# Patient Record
Sex: Male | Born: 1960
Health system: Southern US, Community
[De-identification: ages and names within clinical notes are randomized; demographics above are authoritative.]

## PROBLEM LIST (undated history)

## (undated) DIAGNOSIS — R200 Anesthesia of skin: Secondary | ICD-10-CM

## (undated) DIAGNOSIS — K760 Fatty (change of) liver, not elsewhere classified: Secondary | ICD-10-CM

## (undated) DIAGNOSIS — K219 Gastro-esophageal reflux disease without esophagitis: Secondary | ICD-10-CM

## (undated) DIAGNOSIS — G4700 Insomnia, unspecified: Secondary | ICD-10-CM

## (undated) DIAGNOSIS — E119 Type 2 diabetes mellitus without complications: Secondary | ICD-10-CM

## (undated) DIAGNOSIS — Z8719 Personal history of other diseases of the digestive system: Secondary | ICD-10-CM

## (undated) DIAGNOSIS — F419 Anxiety disorder, unspecified: Secondary | ICD-10-CM

## (undated) DIAGNOSIS — M1990 Unspecified osteoarthritis, unspecified site: Secondary | ICD-10-CM

## (undated) DIAGNOSIS — E785 Hyperlipidemia, unspecified: Secondary | ICD-10-CM

## (undated) DIAGNOSIS — R790 Abnormal level of blood mineral: Secondary | ICD-10-CM

## (undated) DIAGNOSIS — R202 Paresthesia of skin: Secondary | ICD-10-CM

## (undated) DIAGNOSIS — IMO0001 Reserved for inherently not codable concepts without codable children: Secondary | ICD-10-CM

## (undated) DIAGNOSIS — Z8669 Personal history of other diseases of the nervous system and sense organs: Secondary | ICD-10-CM

## (undated) DIAGNOSIS — I10 Essential (primary) hypertension: Secondary | ICD-10-CM

## (undated) DIAGNOSIS — K5790 Diverticulosis of intestine, part unspecified, without perforation or abscess without bleeding: Secondary | ICD-10-CM

## (undated) DIAGNOSIS — N529 Male erectile dysfunction, unspecified: Secondary | ICD-10-CM

## (undated) DIAGNOSIS — D649 Anemia, unspecified: Secondary | ICD-10-CM

## (undated) HISTORY — PX: ESOPHAGEAL DILATION: SHX303

## (undated) HISTORY — PX: VASCULAR SURGERY: SHX849

## (undated) HISTORY — PX: HERNIA REPAIR: SHX51

## (undated) HISTORY — PX: CHOLECYSTECTOMY: SHX55

## (undated) HISTORY — PX: RETINAL DETACHMENT SURGERY: SHX105

## (undated) HISTORY — DX: Type 2 diabetes mellitus without complications: E11.9

---

## 2004-05-17 ENCOUNTER — Inpatient Hospital Stay (HOSPITAL_COMMUNITY): Admission: EM | Admit: 2004-05-17 | Discharge: 2004-05-20 | Payer: Self-pay | Admitting: Emergency Medicine

## 2004-11-27 ENCOUNTER — Ambulatory Visit (HOSPITAL_COMMUNITY): Admission: RE | Admit: 2004-11-27 | Discharge: 2004-11-27 | Payer: Self-pay | Admitting: Family Medicine

## 2004-12-26 ENCOUNTER — Inpatient Hospital Stay (HOSPITAL_COMMUNITY): Admission: RE | Admit: 2004-12-26 | Discharge: 2005-01-06 | Payer: Self-pay | Admitting: Orthopedic Surgery

## 2004-12-29 ENCOUNTER — Ambulatory Visit: Payer: Self-pay | Admitting: Internal Medicine

## 2005-02-05 ENCOUNTER — Ambulatory Visit: Payer: Self-pay | Admitting: Internal Medicine

## 2005-02-13 ENCOUNTER — Ambulatory Visit: Payer: Self-pay | Admitting: Internal Medicine

## 2005-02-13 ENCOUNTER — Ambulatory Visit (HOSPITAL_COMMUNITY): Admission: RE | Admit: 2005-02-13 | Discharge: 2005-02-13 | Payer: Self-pay | Admitting: Internal Medicine

## 2008-10-14 ENCOUNTER — Emergency Department (HOSPITAL_COMMUNITY): Admission: EM | Admit: 2008-10-14 | Discharge: 2008-10-15 | Payer: Self-pay | Admitting: Emergency Medicine

## 2011-01-05 LAB — URINE MICROSCOPIC-ADD ON

## 2011-01-05 LAB — URINALYSIS, ROUTINE W REFLEX MICROSCOPIC
Glucose, UA: NEGATIVE mg/dL
Specific Gravity, Urine: 1.025 (ref 1.005–1.030)
Urobilinogen, UA: 4 mg/dL — ABNORMAL HIGH (ref 0.0–1.0)
pH: 6 (ref 5.0–8.0)

## 2011-02-06 NOTE — H&P (Signed)
NAMEGERBER, PENZA                 ACCOUNT NO.:  [id number]   MEDICAL RECORD NO.:  [id number]           PATIENT TYPE:  AMB   LOCATION:                                FACILITY:  [facility]   PHYSICIAN:  R. Roetta Sessions, M.D. DATE OF BIRTH:  1960-10-21   DATE OF ADMISSION:  02/05/2005  DATE OF DISCHARGE:  LH                                [name] & PHYSICAL   CHIEF COMPLAINT:  Mr. Hollingshed is a 50 year old Caucasian male who underwent a  laparoscopic cholecystectomy for symptomatic cholelithiasis back on December 25, 2004.  Unfortunately, his postoperative course was complicated by a biliary  leak confirmed on HIDA scan.  ERCP was done emergently on January 01, 2005,  which revealed a __________ ampullary duodenal diverticulum.  There was a  cystic duct stump leak, and a possible small Inboden in the distal common bile  duct.  An 8.5 x 5 cm stent was placed.  He gradually recovered.  He is  really doing well now, and comes for stent removal.  He has not had any  fever, chills, clay-colored stools, dark-colored urine.  He has not had any  nausea or vomiting.  Appetite is really good, as he reports.   PAST MEDICAL [name]:  1.  Significant for a bout of pancreatitis, likely biliary related, in 1995.  2.  [name] of dysphagia secondary to Schatzki's ring, status post EGD by      Dr. Karilyn Cota in 2005.  3.  [name] of detached retina in 1979 with repair by Dr. Cecilie Kicks and      retention of vision.  4.  Left inguinal herniorrhaphy.  5.  [name] of gastroesophageal reflux disease.  6.  Respiratory arrest during his hospital followed cholecystectomy.   CURRENT MEDICATIONS:  1.  Omeprazole 20 mg orally daily.  2.  Tums.  3.  Dilaudid.   FAMILY [name]:  No [name] of chronic GI or liver disease.   SOCIAL [name]:  The patient has been married for 19 years.  He has a 9-year-  old adopted son.  He is a Archivist here in town, and he also runs a  nursery.  No alcohol, no tobacco.   REVIEW OF SYSTEMS:   As in [name] of present illness.   PHYSICAL EXAMINATION:  GENERAL:  A pleasant 50 year old gentleman who looks  well today.  VITAL SIGNS:  Weight 189, height 6 feet.  Temperature 98, blood pressure  118/64, pulse 76.  SKIN:  Warm and dry.  There is no jaundice.  HEENT:  No scleral icterus.  CHEST:  Lungs are clear to auscultation.  CARDIAC:  Regular rate and rhythm without murmur, gallop, or rub.  ABDOMEN:  Nondistended.  Positive bowel sounds, soft, nontender, without  appreciable mass or organomegaly.  EXTREMITIES:  No edema.   IMPRESSION:  Mr. Stanly [name] is a pleasant 51 year old gentleman who  unfortunately suffered a cystic duct stump leak following laparoscopic  cholecystectomy last month.  He underwent biliary stenting.  He has  recovered uneventfully.  It is time now to remove the stent.  We talked  about the need for a repeat ERCP under general anesthesia with stent  removal.  I told him a cholangiogram would be obtained.  We need to confirm  leak ceiling, which, in all likelihood, has indeed occurred.  The potential  risk of the procedure, including, but not limited to, a 1 in 10 chance of  pancreatitis, bleeding, rash from medications, were fully reviewed.  Once  again, his questions were answered, and he is agreeable.  Will plan to  perform an ERCP at [facility] in the very near future.  He will  receive a dose of Levaquin and obtain baseline labs were obtained prior to  the procedure.  Further recommendations to follow.      RMR/MEDQ  D:  02/05/2005  T:  02/05/2005  Job:  [id number]   cc:   Donna Bernard, M.D.  [address]  [place]  [place] [address]  Fax: [phone]   Dirk Dress. Katrinka Blazing, M.D.  [address]  [place]  [place] [address]  Fax: [phone]

## 2011-02-06 NOTE — Op Note (Signed)
[name], [name]                 ACCOUNT NO.:  [id number]   MEDICAL RECORD NO.:  [id number]          PATIENT TYPE:  INP   LOCATION:  A320                          FACILITY:  [facility]   PHYSICIAN:  R. Roetta Sessions, M.D. DATE OF BIRTH:  Oct 22, 1960   DATE OF PROCEDURE:  01/01/2005  DATE OF DISCHARGE:                                 OPERATIVE REPORT   PROCEDURE:  Endoscopic retrograde cholangiopancreatography with stent  placement.   INDICATIONS FOR PROCEDURE:  The patient is a 50 year old gentleman status  post laparoscopic cholecystectomy December 25, 2004. He has had abdominal pain,  tachycardia, and leukocytosis since that time. HIDA and CT demonstrate leak  and a large biloma. Respectively, ERCP is now being done for stent  placement. This approach has been discussed with the patient at length.  Potential risks, benefits, and alternatives have been reviewed, questions  answered. Please see the discussion in the consultation note. Risks,  benefits, and alternatives have been reviewed in detail, risks including but  not limited to pancreatitis, perforation, reaction to medications. He has  significant ascites as well. All parties are agreeable.   PROCEDURE NOTE:  The patient was placed in a semi-prone position on the OR  table. General anesthesia was introduced by Dr. Jayme Cloud and associates.   INSTRUMENT:  Olympus video chip system.   FINDINGS:  Cursory examination of the distal esophagus, stomach, D1 and D2  revealed no abnormalities aside from a juxta-ampullary duodenal  diverticulum. Scope was pulled back to the short position 55 cm from the  incisors. Scalp film was taken. Using a Microvasive sphincterotome, the  ampulla was found in the base of the diverticulum. Using guidewire  palpation, deep cannulation was obtained after a partial injection of the  pancreatic duct. Deep cannulation of the biliary tree was fairly rapidly  obtained. Cholangiogram was performed. There was a  questionable small 1 to 2  mm filling defect distal CBD. There was forward extravasation of contrast  cystic duct stump. There were two clips overlying the CBD, but I had no  trouble whatsoever injecting contrast or passing a wire across this area. We  were limited in ability to rotate the C arm or the patient. I suspect this  was actually an overlying clip on the cystic duct stump rather than a clip  on the CBD. Please see images. Guidewire was placed deep in the biliary  tree. The sphincterotome was removed over which an 8.5-French, 5-cm, plastic  stent was railed. Was placed in excellent position in the distal CBD. The  guiding catheter and guidewire were subsequently removed. Since the patient  had tense ascites and this was making him somewhat harder to ventilate per  anesthesia (after obtaining informed consent from the family outside the OR  and talking to them about this approach for tomorrow, I elected to go ahead  and attempt paracentesis right lower quadrant to give him some relief and  facilitate respirations/pulmonary mechanics postoperatively. Please see  separate dictation. The patient tolerated the ERCP well without apparent  complications.   IMPRESSION:  1.  Juxta-ampullary duodenum diverticulum, ampulla  actually in the base of      the diverticulum. Cholangiogram performed revealing a _______________      cystic duct stump leak, possible small Waynick in the distal common bile      duct.  2.  Clips overlying the common bile duct likely artifactual and are clips on      the cystic duct stump status post stenting as described above.   RECOMMENDATIONS:  Will check labs tomorrow morning. Advance diet as  tolerated. Will need his stent removed in approximately four weeks.      RMR/MEDQ  D:  01/01/2005  T:  01/01/2005  Job:  [id number]   cc:   Gerda Diss, M.D.

## 2011-02-06 NOTE — Op Note (Signed)
NAME:  [name] [name], [name] [name]                           ACCOUNT NO.:  [id number]   MEDICAL RECORD NO.:  [id number]                   PATIENT TYPE:  INP   LOCATION:  A311                                 FACILITY:  [facility]   PHYSICIAN:  Lionel December, M.D.                 DATE OF BIRTH:  1960/10/11   DATE OF PROCEDURE:  05/20/2004  DATE OF DISCHARGE:                                 OPERATIVE REPORT   PROCEDURE:  Esophagogastroduodenoscopy with esophageal dilation followed by  ampullary exam and bile aspiration with duodenoscope.   INDICATIONS FOR PROCEDURE:  [name] [name] is a 50 year old Caucasian male who  presents with his first episode of pancreatitis, etiology of which is  unclear.  He has improved with supportive therapy.  He also has solid food  dysphagia.  He is undergoing EGD both to look for peptic ulcer disease and  determine the cause of his dysphagia.  He will also have examination of his  papilla with the help of a duodenoscope.  The procedure risks were reviewed  with the patient, and informed consent for the procedure was obtained.   PREOPERATIVE MEDICATIONS:  Cetacaine spray for pharyngeal topical  anesthesia, Demerol 50 mg IV, Versed 10 mg IV in divided dose.   FINDINGS:  The procedure was performed in the endoscopy suite.  The  patient's vital signs and O2 saturations were monitored during the procedure  and remained stable.  The patient was placed in the left lateral recumbent  position, and the Olympus videoscope was passed via the oropharynx without  any difficulty into the esophagus.   Esophagus:  The mucosa of the esophagus was normal throughout.  There was a  ring at the GE junction.  There was a small sliding hiatal hernia.  There  was a small localized varix at the proximal esophagus felt to be an  insignificant finding.   Stomach:  It was empty and distended very well with insufflation.  The folds  of the proximal stomach were normal.  Examination of  the mucosa at body,  antrum, pyloric channel, as well as angularis, fundus, and cardia was  normal.   Duodenum:  Examination of the bulb revealed normal mucosa.  The scope was  passed to the second part of the duodenum where the mucosa and folds were  normal.   The endoscope was withdrawn, and the esophagus was dilated by passing a 56  [place] Maloney dilator.  The scope was passed again, and the ring was noted  to have been effectively disrupted.  Pictures were taken for the record.  The endoscope was withdrawn.  The Olympus videoduodenoscope was passed via  the oropharynx into the stomach, across the pylorus and into the bulb and  descending duodenum.  The ampulla of Vater was located in the wall of a  duodenal diverticulum.  It was normal.  There was a flow of __________ bile  in the  duodenum which was aspirated for cholesterol crystals.  The endoscope  was withdrawn.  The patient tolerated the procedure well.   FINAL DIAGNOSES:  1.  Schatski's ring above a small sliding hiatal hernia.  The ring was      dilated/disrupted by passing a 56 [place] Maloney dilator.  No evidence      of peptic ulcer disease.  2.  Normal-appearing ampulla of Vater with periampullary diverticulum.  Bile      aspirated for cholesterol crystals.   RECOMMENDATIONS:  1.  Low fat diet.  2.  He should continue PPI.  If bile is positive for cholesterol crystals,      he will need cholecystectomy.  Otherwise, he should return in a few      weeks for repeat ultrasound to make sure that the gallbladder wall is      back to normal.      ___________________________________________                                            Lionel December, M.D.   NR/MEDQ  D:  05/20/2004  T:  05/20/2004  Job:  [id number]   cc:   Donna Bernard, M.D.  [address]  [place]  [place] [address]  Fax: [phone]

## 2011-02-06 NOTE — Op Note (Signed)
NAMESHMUEL, GIRGIS                 ACCOUNT NO.:  [id number]   MEDICAL RECORD NO.:  [id number]          PATIENT TYPE:  AMB   LOCATION:  DAY                           FACILITY:  [facility]   PHYSICIAN:  Jerolyn Shin C. Katrinka Blazing, M.D.   DATE OF BIRTH:  Dec 05, 1960   DATE OF PROCEDURE:  12/26/2004  DATE OF DISCHARGE:                                 OPERATIVE REPORT   PREOPERATIVE DIAGNOSIS:  Cholelithiasis, cholecystitis.   POSTOPERATIVE DIAGNOSIS:  Cholelithiasis, cholecystitis.   PROCEDURE:  Laparoscopic cholecystectomy.   SURGEON:  Dr. Katrinka Blazing.   DESCRIPTION:  Under general anesthesia, the patient's abdomen was prepped  and draped in a sterile field. A supraumbilical midline incision was made,  and Veress needle was inserted uneventfully. Abdomen was insufflated with  2.5 liters of CO2. Using a Visiport guide, a 10-mm port was placed  uneventfully. Laparoscope was placed. Gallbladder was visualized. Under  videoscopic guidance, a 10-mm port and two 5-mm ports were placed without  difficulty. The gallbladder was grasped. It was tightly distended, so it was  decompressed using a Weck needle. Once this was done, the gallbladder was  positioned. There were acute and chronic adhesions to the undersurface of  the gallbladder. These were taken down using electrocautery and blunt  dissection. Resection was continued down to the infundibulum. There was  marked acute inflammation in the area of the infundibulum. There were  multiple small vessels which extended to the infundibulum of the  gallbladder. Staying close to the gallbladder, four of these small vessels  were dissected, clipped with multiple clips, and divided. He had a very  short cystic duct. Care was taken to dissect the duct away from the common  bile duct and from the infundibulum. All of these appeared to line in almost  a parallel plane. Dissection was carried out until I was fully assured that  the structure that lead from the end of the  gallbladder and into the common  bile duct was indeed the cystic duct. Once this was done, the gallbladder  was partially separated from the bed of the liver with blunt dissection.  This reconfirmed that this was indeed a very short cystic duct. The duct was  clipped with four clips and divided. After this was done because of the  inflammation. The gallbladder was bluntly dissected from the bed.  Electrocautery was not really necessary. Hemostasis in the bed was achieved.  Gallbladder was placed in the EndoCatch device and retrieved intact.  Irrigation of the bed was carried out, and further hemostasis was carried  out until there was no bleeding. There was no evidence of bile leak.  Irrigation above the liver was carried out. CO2 was allowed to escape from  the abdomen, and the ports were removed. The  incisions were closed using 0 Dexon on the fascia of the supraumbilical  incision and staples on the skin. Dressings were placed. The patient was  awakened from anesthesia, transferred to a bed, and taken to the post-  anesthesia care unit in satisfactory condition.      LCS/MEDQ  D:  12/26/2004  T:  12/26/2004  Job:  [id number]   cc:   Donna Bernard, M.D.  [address]  [place]  [place] [address]  Fax: [phone]

## 2011-02-06 NOTE — Discharge Summary (Signed)
NAMEVA, BROADWELL                           ACCOUNT NO.:  [id number]   MEDICAL RECORD NO.:  [id number]                   PATIENT TYPE:  INP   LOCATION:  A311                                 FACILITY:  [facility]   PHYSICIAN:  Scott A. Gerda Diss, M.D.               DATE OF BIRTH:  June 05, 1961   DATE OF ADMISSION:  05/17/2004  DATE OF DISCHARGE:  05/20/2004                                 DISCHARGE SUMMARY   DISCHARGE DIAGNOSES:  1.  Pancreatitis.  2.  Reflux.   HOSPITAL COURSE:  The patient was admitted in with significant abdominal  pain and discomfort along with elevated amylase.  The pain was severe enough  to almost cause him to pass out.  He had a CT scan ordered.  He also had  consultation done.  The CT scan showed __________ diffuse, retroperitoneal  inflammation of the pancreas consistent with acute pancreatitis.  He also  had an EGD done.  Ultrasound did not show any blockage of ducts.  EGD shows  significant, but small, hiatal hernia, normal stomach, normal duodenum.  Plus, also, a prominent esophageal ring, which was disrupted by procedure by  Dr. Karilyn Cota.  He was discharged to home in good condition and instructed to  follow-up with Dr. __________ in the following week.     _____________________________________  ___________________________________________  Phineas Semen, P.A.                    Scott A. Gerda Diss, M.D.   CL/MEDQ  D:  06/05/2004  T:  06/05/2004  Job:  [id number]

## 2011-02-06 NOTE — Procedures (Signed)
[name] [name], CARATTINI                 ACCOUNT NO.:  [id number]   MEDICAL RECORD NO.:  [id number]          PATIENT TYPE:  INP   LOCATION:  A320                          FACILITY:  [facility]   PHYSICIAN:  Madaline Savage, M.D.DATE OF BIRTH:  07/17/1961   DATE OF PROCEDURE:  01/01/2005  DATE OF DISCHARGE:                                  ECHOCARDIOGRAM   INDICATIONS FOR PROCEDURE:  The patient has apparently had a recent  gallbladder operation and has tachycardia postoperatively. The patient  apparently has no prior cardiac history. Technically, this study is adequate  for interpretation.   RESULTS:  1.  Aortic valve:  The aortic valve is tri-leaflet. It opens and closes      normally. There is no evidence of stenosis. There is no regurgitation      noted. No masses or vegetations are seen.  2.  Mitral valve:  The mitral valve shows no evidence of prolapse. Leaflet      opening and closure is normal. Sub-valvular apparatus is unremarkable.      No evidence of prolapse.  There is also mild mitral regurgitation seen.  3.  Tricuspid valve:  The tricuspid valve is grossly normal. No      regurgitation seen.  4.  Pulmonary valve:  Pulmonic valve incompletely seen, probably within      normal limits.  5.  Aorta:  Normal aortic root dimension 3.9.  6.  Left atrium:  Normal left atrial size of 3.9 to 4.0. No atrial masses      noted.  7.  Right atrium:  Normal.  8.  Right ventricle:  Normal.  9.  Left ventricle:  Normal contractility. Ejection fraction estimate 65% to      75%. All walls are hyper-dynamic and there is tachycardia seen. The left      ventricular chamber dimensions are mildly thickened. Interventricular      septum and posterior walls both measure 1.3.  10:  Pericardium:  The pericardium is not particularly thickened or abnormal  in appearance. There is a small posterior pericardial effusion.   FINAL DIAGNOSES:  1.  Hyper-dynamic left ventricular systolic function. Ejection  fraction      estimate 65% to 75% with mild concentric      left ventricular hypertrophy.  2.  NO valvular pathology noted.  3.  Tiny posterior pericardial effusion.      WHG/MEDQ  D:  01/01/2005  T:  01/01/2005  Job:  [id number]   cc:   Dirk Dress. Katrinka Blazing, M.D.  [address]  [place]  [place] [address]  Fax: [phone]

## 2011-02-06 NOTE — Consult Note (Signed)
NAME:  [name] [name], [name] [name]                           ACCOUNT NO.:  [id number]   MEDICAL RECORD NO.:  [id number]                   PATIENT TYPE:  INP   LOCATION:  A311                                 FACILITY:  [facility]   PHYSICIAN:  Lionel December, M.D.                 DATE OF BIRTH:  10/27/1960   DATE OF CONSULTATION:  DATE OF DISCHARGE:                                   CONSULTATION   REASON FOR CONSULTATION:  1.  Solid food dysphagia.  2.  Dyspepsia.   HISTORY OF PRESENT ILLNESS:  [name] [name] is a 50 year old Caucasian male who  was recently admitted to [facility] on May 12, 2004, with acute  idiopathic pancreatitis.  Upon admission, his amylase was elevated at 511  and lipase was elevated at 1320.  CT scan findings included mild, diffuse  retroperitoneal edema and inflammation showing acute pancreatis.  There was  also fatty infiltration on the left with focal fatty sparing to the left of  the gallbladder region.  He was also found to have diverticulosis as well as  a left inguinal hernia.  Amylase and lipase are back to normal now at 57 and  37 respectively.  As far as his epigastric pain goes, he is feeling much  better.  He notes a sensation of dysphagia with feeling of solid foods  becoming stuck retrosternally.  He notes heartburn and water brash as well  as atypical chest pain postprandially usually with spicy foods.  He has been  using Tums four to eight times a week for the last year or two which does  not completely resolve the symptoms.  He did have an episode of nausea and  vomiting which was Friday evening when this episode of acute pancreatitis  presented.  Otherwise, he denies any early satiety.  He noted the pain was 8  out of 10 to his upper abdomen on Friday as well.  He denies any  odynophagia. Bowel movements have been two to three times per day, soft and  brown without any melena or blood.  Weight has remained stable as well as  appetite.   PAST MEDICAL  HISTORY:  Denies.   SURGICAL HISTORY:  1.  Left inguinal hernia repair.  2.  A detached retina in 1979.   CURRENT MEDICATIONS:  Over-the-counter Tums p.r.n.   ALLERGIES:  No known drug allergies.   FAMILY HISTORY:  No known family history of colorectal carcinoma, liver or  chronic GI problems.  Mother age 22 is alive and healthy.  Father age 77  with a history of diabetes mellitus and coronary artery disease.  He has one  healthy sister.   SOCIAL HISTORY:  [name] [name] has been married for 18 years.  He has an 8-year-  old adopted son.  He denies any alcohol, tobacco or drug use.  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   REVIEW OF SYSTEMS:  CONSTITUTIONAL:  Weight has been stable.  Appetite is  okay.  He denies any fatigue.  CARDIOVASCULAR:  Denies any palpitations or  chest pain other than described in HPI.  PULMONOLOGY:  He does report  occasional cough which is worse at bedtime when he lies supine.  He denies  any hemoptysis or dyspnea.  GI:  See HPI.  GU:  Denies any dysuria,  hematuria or increased urinary frequency.   PHYSICAL EXAMINATION:  VITAL SIGNS:  Weight 202 pounds.  Height 71 inches.  Temperature 98.4.  Pulse 54.  Respirations 20.  Blood pressure 113/81.  GENERAL:  [name] [name] is alert, oriented, pleasant, cooperative Caucasian male  who is in no acute distress.  HEENT:  Sclerae are clear, nonicteric.  Conjunctivae are pink.  Oropharynx  is pink and moist without any lesions.  NECK:  Supple without any mass or thyromegaly.  CHEST:  Heart regular rate and rhythm, normal S1 and S2 without any murmurs,  clicks, rubs or gallops.  LUNGS:  Clear to auscultation bilaterally.  ABDOMEN:  Positive bowel sounds x4.  No bruits auscultated, soft, nontender,  nondistended without palpable mass or hepatosplenomegaly.  No rebound,  tenderness or guarding.  Negative Murphy's sign.  EXTREMITIES:  Good pedal pulses bilaterally, no edema.  SKIN:   Pink, warm and dry without rash or jaundice.   LABORATORY DATA:  WBC is 12.6, hemoglobin 15.5, hematocrit 45, platelets  157, calcium 8.5, sodium 138, potassium 4, chloride 104, CO2 27, BUN 9,  creatinine 0.9.  Glucose 133.  Total bilirubin 2, direct 0.3, and indirect  1.7.  Alkaline phosphatase 63, SGOT 17, SGPT 31.  Total protein 6.  Albumin  3.1.  Total cholesterol 67.  Triglycerides mildly elevated at 188.  HDL low  at 28.  LDL 101.  Amylase 57, lipase 37, CT as described in HPI.   ASSESSMENT:  Ms. Okane is a 50 year old Caucasian male with acute idiopathic  pancreatitis which is resolving well.  He does complain of a significant  history of solid food dysphagia as well as chronic gastroesophageal reflux  disease symptoms and dyspepsia over the last two years which have more  recently worsened.  There were no gallbladder abnormalities on CT scan,  although he does have abdominal ultrasound pending which will rule out  choledocholithiasis as well as cholelithiasis.  Further evaluation is  necessary to evaluate his upper GI tract for complications of chronic  gastroesophageal reflux disease including web ring stricture or less likely  neoplasm.   RECOMMENDATIONS:  1.  Will follow up on ultrasound report.  2.  Will schedule EGD in the morning with Dr. Lionel December.  This has been      discussed with Dr. Karilyn Cota as well as the patient, and both are in      agreement with this plan.  Consent will be obtained.  3.  I have discussed the risks and benefits to include, but not limited to      bleeding, infection, perforation and drug reaction.  He agrees with the      plan.  4.  We will resume a full liquid diet for now.  N.p.o. post midnight for EGD      in the morning.     ________________________________________  ___________________________________________  Nicholas Lose, N.P.                  Lionel December, M.D.  KC/MEDQ  D:  05/19/2004  T:  05/19/2004  Job:  [id number]   cc:    Lorin Picket A. Gerda Diss, M.D.  [address]  [place]  [place] [address]  Fax: [phone]

## 2011-02-06 NOTE — H&P (Signed)
[name] [name], GHOSH                 ACCOUNT NO.:  [id number]   MEDICAL RECORD NO.:  [id number]          PATIENT TYPE:  AMB   LOCATION:  DAY                           FACILITY:  [facility]   PHYSICIAN:  Jerolyn Shin C. Katrinka Blazing, M.D.   DATE OF BIRTH:  April 12, 1961   DATE OF ADMISSION:  DATE OF DISCHARGE:  LH                                HISTORY & PHYSICAL   This is a 50 year old male who has had two episodes of severe abdominal pain  in his epigastrium radiating to his back.  He has a prior history of  pancreatitis for which he was hospitalized for four days.  The patient has  onset of symptoms with each meal.  Gallbladder ultrasound shows gallbladder  wall thickening with sludge and small stones for pericholecystic fluid.  There is no family history of gallstone disease.  The patient is scheduled  for cholecystectomy.   PAST MEDICAL HISTORY:  Unremarkable except for gastroesophageal reflux  disease.   PAST SURGICAL HISTORY:  Left inguinal hernia repair.   ALLERGIES:  None.   MEDICATIONS:  Omeprazole 20 mg daily.   FAMILY HISTORY:  Positive for diabetes mellitus.   SOCIAL HISTORY:  He is married.  He is self-employed as a Archivist.  He  does not smoke, drink or use drugs.   PHYSICAL EXAMINATION:  VITAL SIGNS:  Blood pressure 130/77, pulse 72,  respirations 18, weight 199 pounds.  HEENT:  Unremarkable.  NECK:  Supple without JVD or bruits.  CHEST:  Clear to auscultation.  HEART:  Regular rate and rhythm without murmur, gallop or rub.  ABDOMEN:  Soft, actually no tenderness.  Normal bowel sounds.  EXTREMITIES:  No cyanosis, clubbing or edema.  NEUROLOGIC:  No focal motor, sensory or cerebellar deficits.   IMPRESSION:  1.  Cholelithiasis with cholecystitis.  2.  Gastroesophageal reflux disease.   PLAN:  Laparoscopic cholecystectomy.      LCS/MEDQ  D:  12/25/2004  T:  12/26/2004  Job:  [id number]

## 2011-02-06 NOTE — H&P (Signed)
[name] [name], [name] [name]                           ACCOUNT NO.:  [id number]   MEDICAL RECORD NO.:  [id number]                   PATIENT TYPE:  EMS   LOCATION:  ED                                   FACILITY:  [facility]   PHYSICIAN:  Scott A. Gerda Diss, M.D.               DATE OF BIRTH:  03-06-1961   DATE OF ADMISSION:  05/17/2004  DATE OF DISCHARGE:                                HISTORY & PHYSICAL   CHIEF COMPLAINT:  Abdominal pain.   HISTORY OF PRESENT ILLNESS:  A 50 year old white male who relates that he  had an onset of abdominal pain yesterday while eating a pack of __________  , severe epigastric, went away after about 20-30 minutes, but then  reoccurred, so severe that if he tried to get up he felt like he was going  to pass out.  He was very nauseated with it, threw up once.  Denied any  blood in it.  States he went on to the emergency 911 at first but then the  pain subsided so his friend went ahead and took him up to see [facility], saw Dr. Lubertha South.  At that time, the pain had subsided,  thought it was possibly gastritis but was told to call back if worse.  The  patient had severe pain throughout the whole night with nausea.  No  vomiting.  Denied any bloody stools, had a normal bowel movement this  morning, normal urination.  The patient states the pain was very severe this  morning but has eased up some currently.   PAST MEDICAL HISTORY:  1. He has been hospitalized twice in the past, once with a detached retina     in 1979.  2. And, once with an inguinal hernia in the 1970s.   SOCIAL HISTORY:  The patient does not drink or smoke.   MEDICATIONS:  None.   ALLERGIES:  None.   LABS:  WBC 12.7, hemoglobin 16.1.  Amylase 511.  Liver functions were normal  but bilirubin slightly elevated at 1.8.  Urinalysis negative.   PHYSICAL EXAMINATION:  HEENT:  Benign.  NECK:  Supple.  CHEST:  CTA.  No crackles.  HEART:  Regular.  ABDOMEN:  Soft, nontender with mild  epigastric tenderness.  EXTREMITIES:  No edema.  RECTAL:  Negative.  GU:  Normal.   ASSESSMENT/PLAN:  Pancreatitis.  Feel the best thing for this patient would  be admitted in.  Repeat labs in the morning.  Place on a liquid diet.  Also  do a CT of the abdomen and pelvis to look to see if there is any peripheral  reason why he is having pancreatitis.  Also check a lipid profile in the  morning to look at his triglycerides.  Ultrasound technology not available  currently.  I do not feel the patient has a surgical abdomen at this point  in time.  ___________________________________________                                         Jonna Coup Gerda Diss, M.D.   Linus Orn  D:  05/17/2004  T:  05/17/2004  Job:  [id number]

## 2011-02-06 NOTE — Discharge Summary (Signed)
NAMEKOLBE, DELMONACO                 ACCOUNT NO.:  [id number]   MEDICAL RECORD NO.:  [id number]          PATIENT TYPE:  INP   LOCATION:  A320                          FACILITY:  [facility]   PHYSICIAN:  Dirk Dress. Katrinka Blazing, M.D.   DATE OF BIRTH:  20-Aug-1961   DATE OF ADMISSION:  12/26/2004  DATE OF DISCHARGE:  04/18/2006LH                                 DISCHARGE SUMMARY   DISCHARGE DIAGNOSIS:  1.  Cholelithiasis, cholecystitis.  2.  Acute respiratory arrest due to a Dilaudid.  3.  Cystic duct leak postoperative.  4.  Viral peritonitis.  5.  Urinary tract infection.  6.  Gastroesophageal reflux disease.   SPECIAL PROCEDURES:  1.  Laparoscopic cholecystectomy April 7.  2.  ERCP with common bile duct stent placement April 13.  3.  Abdominal paracentesis April 13.  4.  Ultrasound-guided abdominal paracentesis April 14.  5.  Laparoscopy with peritoneal lavage April 14.   DISPOSITION:  The patient is discharged home significantly improved.   DISCHARGE MEDICATIONS:  1.  Xanax 0.5 mg three times daily.  2.  Reglan 10 mg before meals and at bedtime.  3.  Lasix 40 mg daily for five days.  4.  Potassium chloride 20 mEq twice daily for seven days.  5.  Keflex 500 mg four times daily for seven days.  6.  Levaquin 500 mg daily for seven days.  7.  Demerol 50 mg every four hours as needed for pain.   Patient is scheduled to be seen in the office four days post discharge.   SUMMARY:  A 50 year old male with two episodes of severe abdominal pain in  his epigastrium radiating through to his back.  He has a prior history of  pancreatitis requiring hospitalization.  He had onset of symptoms with each  meal.  Gallbladder ultrasound showed gallbladder wall thickening with sludge  and small stones, but no pericholecystic fluid.  He was admitted for  laparoscopic cholecystectomy.  This was done on April 7.  The patient did  well in the early postoperative period.  However, while making afternoon  rounds  the patient became unresponsive after receiving 2 mg of Dilaudid IV.  When I evaluated him he was slumped over in bed with ineffective  respirations and no response to pain.  He had a palpable right radial pulse.  His initial O2 saturation was in the 70% range.  He was given O2, turned  supine.  His head was elevated and his respirations improved.  He did not  need any compressions or ventilatory support.  He was promptly given Narcan  1 mg slow IV push and he became fully alert, started complaining of pain.  Vital signs immediately after the Narcan revealed blood pressure 137/74,  pulse 102, respirations 24, O2 saturation 98%.  He remained stable over the  next hour and he was subsequently given some Toradol IV for pain.  It is  felt that he had an adverse reaction with respiratory depression due to  Dilaudid.  The patient continued, however, to complain of right shoulder  pain and he had  persistent hypoxemia on room air.  CT angiography was done  to rule out pulmonary embolus and this was negative for pulmonary embolus.  By the 10th patient was stable.  He had no nausea.  He did complain of left  lower quadrant pain.  O2 saturation was 92% on room air.  It was noted on  the 11th that he had leukocytosis with white count 17,000.  By the 12th he  was complaining of anorexia, increasing abdominal distension, and more  peripheral edema.  HIDA was done and this showed an area of increased uptake  in the bed of the liver which suggested a bile leak.  White count was still  elevated.  CT scan of the abdomen and pelvis was done.  This revealed a  large volume of fluid in the abdomen compatible with a biloma.  He was  discussed with Dr. Jena Gauss and Dr. Jena Gauss did an ERCP with stenting.  ERCP  confirmed cystic duct leak.  Stent was placed.  He attempted to do  paracentesis, but could only remove 75 mL of fluid.  Ultrasound-guided  paracentesis was done the next morning by radiology but they could only  get  30 mL.  It was therefore felt that we needed to drain his abdomen  laparoscopically.  This was discussed with the patient and his wife.  Laparoscopic lavage and drainage of the large volume of peritoneal fluid was  carried out.  Post procedure his white count was 73,000 but it gradually  improved.  After the paracentesis a JP drain was placed and he had bilious  drainage through his JP up until the time the that he was discharged.  By  the 17th the patient was stable.  He had mild pain in the right upper  quadrant.  White count was found to 21.4.  Liver function studies were  normal.  He continued to do well and on the evening of the 18th he was  significantly improved.  He was basically afebrile and arrangements were  made for him to be discharged home with plans for close follow-up in the  office.       LCS/MEDQ  D:  02/15/2005  T:  02/15/2005  Job:  [id number]

## 2011-02-06 NOTE — Op Note (Signed)
NAMELAMAR, METER                 ACCOUNT NO.:  [id number]   MEDICAL RECORD NO.:  [id number]          PATIENT TYPE:  INP   LOCATION:  A320                          FACILITY:  [facility]   PHYSICIAN:  R. Roetta Sessions, M.D. DATE OF BIRTH:  08-30-1961   DATE OF PROCEDURE:  01/01/2005  DATE OF DISCHARGE:                                 OPERATIVE REPORT   PROCEDURE:  Paracentesis.   INDICATIONS FOR PROCEDURE:  The patient is a 50 year old gentleman status  post ERCP with stent placement for _____________ cystic duct stump leak. He  has significant ascites. His abdomen is distended. He is having some  difficulty being ventilated. He just underwent an ERCP. After discussion  with the family, decided to go ahead and attempt paracentesis, large volume,  to give him some relief to facilitate extubation, etc. This was tentatively  planned to occur tomorrow.   DESCRIPTION OF PROCEDURE:  Skin overlying the right lower quadrant prepped  in sterile fashion with Betadine. Three cc  of Xylocaine was used for local  infiltrative anesthesia. Using a [employer] needle, abdominal wall  was traversed. There was amber but serosanguineous appearing fluid that  dripped slowly out of the paracentesis needle, ultimately recovered about 75  cc. I repositioned the needle in the same location a couple of times but was  unable to get a good flow in spite of actually raising the head of the  patient and external abdominal pressure. I elected to make no further  attempts at performing a therapeutic tap. Decided to make arrangements for  Dr. Alver Fisher to take off what he could via ultrasound tomorrow. I suspect some  fluid is loculated.   IMPRESSION:  Status post paracentesis, right lower quadrant, recovery of 75  cc of fluid. See orders.      RMR/MEDQ  D:  01/01/2005  T:  01/01/2005  Job:  [id number]   cc:   Gerda Diss, M.D

## 2011-02-06 NOTE — Op Note (Signed)
NAMECHRISTAPHER, GILLIAN                 ACCOUNT NO.:  [id number]   MEDICAL RECORD NO.:  [id number]          PATIENT TYPE:  AMB   LOCATION:  DAY                           FACILITY:  [facility]   PHYSICIAN:  R. Roetta Sessions, M.D. DATE OF BIRTH:  11/05/1960   DATE OF PROCEDURE:  02/13/2005  DATE OF DISCHARGE:                                 OPERATIVE REPORT   PROCEDURE:  Endoscopic retrograde cholangiopancreatography with stent  removal.   INDICATIONS FOR PROCEDURE:  The patient is a 50 year old gentleman status  post laparoscopic cholecystectomy complicated by cystic duct leak last month  who has done well after having ERCP with stent placement by me. He is here  for ERCP with stent removal, assuming the leak has healed. Potential risks,  benefits, and alternatives have been reviewed. We talked about the 1 in 10  chance of pancreatitis, perforation, reaction to medication, potential for  stent replacement and later removal. All questions were answered, and all  parties were agreeable.   PROCEDURE NOTE:  O2 saturation, blood pressure, pulse, and respirations were  monitored throughout the entire procedure. The patient received Levaquin 250  mg IV prior to the procedure. General anesthesia was induced by Dr. Jayme Cloud  and associates. The patient was placed in the semi-prone position on the OR  table. The patient was noted to have a mottle rash following endotracheal  intubation of general anesthesia for which he was given Decadron and  Benadryl with resolution of the rash.   INSTRUMENT:  Olympus video chip system.   FINDINGS:  Cursory examination of the distal esophagus, stomach, D1 and D2  demonstrated previously placed biliary stent. Duodenum diverticulum was also  seen once again. Scope was pulled back to the short position. Scout film was  taken; however, it was not saved. Using a snare through the scope, the stent  was pulled out with the scope. The scope was reintroduced in the  duodenum  using the Microvasive sphincterotome. Cholangiogram was obtained. I obtained  deep cannulation almost immediately with guide wire palpation. Cholangiogram  was again obtained. There was no evidence of extravasation of contrast. The  biliary tree appeared normal status post cholecystectomy. The biliary tree  appeared to be draining fairly well through the ampullary orifice (prior  sphincterotomy not done). Because I saw steady flow of bile and there was  less contrast density on the last films, I felt no further intervention was  warranted. The procedure was concluded. The patient appeared to have  tolerated the procedure well and will be taken to PACU for reaction.   IMPRESSION:  Duodenum diverticulum, previously placed biliary stent removed.  It is notable this stent was almost totally occluded. Residual biliary tree  appeared normal status post cholecystectomy and previously documented cystic  duct leak has sealed.   RECOMMENDATIONS:  Will allow him to recover in the PACU. Hopefully, we will  be allowed to go home later today with no further intervention.      RMR/MEDQ  D:  02/13/2005  T:  02/13/2005  Job:  [id number]   cc:   Dirk Dress.  Katrinka Blazing, M.D.  [address]  [place]  [place] [address]  Fax: [phone]   Donna Bernard, M.D.  [address]  [place]  [place] [address]  Fax: [phone]

## 2011-02-06 NOTE — Consult Note (Signed)
[name] [name], [place]                 ACCOUNT NO.:  [id number]   MEDICAL RECORD NO.:  [id number]          PATIENT TYPE:  INP   LOCATION:  A320                          FACILITY:  [facility]   PHYSICIAN:  R. Roetta Sessions, M.D. DATE OF BIRTH:  [birth year]   DATE OF CONSULTATION:  01/01/2005  DATE OF DISCHARGE:                                   CONSULTATION   REASON FOR CONSULTATION:  Postoperative bile leak.   HISTORY OF PRESENT ILLNESS:  [name] [name] is a pleasant 50 year old  Caucasian male who presented with symptomatic cholelithiasis back on December 24, 2004. On December 25, 2004, he underwent a laparoscopic cholecystectomy. He  has remained hospitalization since the operation. He has had some dyspnea,  leukocytosis, and upper abdominal pain. He had a respiratory arrest  postoperatively after receiving some IV Dilaudid.   He underwent a HIDA scan last evening which showed a localized tracer  accumulation in the area in the gallbladder fossa. CT scan today  demonstrated a large fluid collection consistent with biloma. I was  subsequently consulted.   CT angiogram postoperatively was negative for pulmonary embolus. His white  count yesterday was 17,200; was 17,200 on April 11, H and H 13.8 and 41.0.  Bilirubin slightly up at 1.7 and direct of 0.8, AST 12, ALT 13.   PAST MEDICAL HISTORY:  1.  Significant for a bout of pancreatitis which he was hospitalized for      four days last year.  2.  History of dysphagia secondary to Schatzki's ring. Underwent EGD by Dr.      Karilyn Cota back on May 20, 2004. Dr Karilyn Cota took a look at the ampulla      with a side-viewing scope; it appeared normal. He did sent fluid off      looking for microlithiasis; those results are unknown to me at this      time.  3.  Detached retina in 1979. He had repair with retention of vision. He is      followed by Dr. Cecilie Kicks.  4.  History of left inguinal herniorrhaphy previously.  5.  History of gastroesophageal reflux  disease for which he takes Tums on a      p.r.n. basis.   CURRENT MEDICATIONS:  Tums p.r.n.   ALLERGIES:  No known drug allergies.   FAMILY HISTORY:  No history of chronic GI or liver illness. Father has  diabetes.   SOCIAL HISTORY:  The patient has been married for 19 years. He has a 9-year-  old adopted son. No alcohol. No tobacco. 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   REVIEW OF SYSTEMS:  As in History of Present Illness. He does not have any  more dysphagia. Reflux fairly well controlled. No melena. No rectal  bleeding. Dyspnea and upper abdominal discomfort postoperatively as outlined  above.   PHYSICAL EXAMINATION:  GENERAL:  Reveals a 50 year old gentleman who appears  not to feel well. He is alert, conversant, accompanied by his wife and  mother.  VITAL SIGNS:  Temperature 98.1, pulse 120,  BP 133/80.  SKIN:  Sallow in appearance. No obvious jaundice or continued stigmata of  chronic liver disease.  HEENT:  No sclerae icterus. Oral cavity with no lesions. JVD is not  prominent.  CHEST:  Lungs are clear with decreased inspirations bilaterally.  CARDIAC:  Tachycardia. Regular rate and rhythm without murmur, gallop or  rub.  ABDOMEN:  Distended, positive fluid wave, shifting dullness. He has diffuse  abdominal tenderness to palpation. No obvious mass, organomegaly, or  rebound.  EXTREMITIES:  Trace lower extremity edema.   IMPRESSION:  [name] [name] is a pleasant 50 year old gentleman who underwent a  laparoscopic cholecystectomy December 25, 2004 for symptomatic cholelithiasis.  Unfortunately, postoperative course has been complicated by a bile leak. He  has significant symptoms related to this problem.   RECOMMENDATIONS:  He needs to have a stent placed in his bile duct to  facilitate closure of the leak, and given the large amount of fluid in his  peritoneal cavity, will likely need a subsequent paracentesis to provide him  with additional relief.    To this end, I have recommended [name] [name] in the presence of his wife and  mother that we proceed with an ERCP ASAP for placement of a biliary stent.  Hopefully, it can be done without a sphincterotomy. We talked about the  potential risks, benefits, alternatives, and limitations; specifically  talked about complications including but limited to a 1 in 10 chance of  pancreatitis, perforation, reaction to medications, and the potential for a  failed procedure necessitating transfer to a tertiary referral center. Also  told them that if a stent is placed it was temporary, and he would have to  have subsequent ERCP in approximately four weeks to document leak closure  and stent removal. All questions were answered. All parties were agreeable.  Not mentioned above, he was started on IV Levaquin yesterday. Will continue  this regimen for the time being. Further recommendations to follow.   I would like to thank Dr. Elpidio Anis for allowing me to see this nice  gentleman today.      RMR/MEDQ  D:  01/01/2005  T:  01/01/2005  Job:  [id number]

## 2012-12-01 ENCOUNTER — Encounter (HOSPITAL_COMMUNITY): Payer: Self-pay | Admitting: Dietician

## 2012-12-01 NOTE — Progress Notes (Signed)
[facility] Diabetes Class Completion  Date:December 01, 2012  Time: 5:30 PM  Pt attended [facility]'s Diabetes [facility] Education Class on December 01, 2012.   Patient was educated on the following topics: survival skills (signs and symptoms of hyperglycemia and hypoglycemia, treatment for hypoglycemia, ideal levels for fasting and postprandial blood sugars, goal Hgb A1c level, foot care basics), recommendations for physical activity, carbohydrate metabolism in relation to diabetes, and meal planning (sources of carbohydrate, carbohydrate counting, meal planning strategies, food label reading, and portion control).   Frederik Standley A. Kayan, RD, LDN   

## 2013-02-22 ENCOUNTER — Other Ambulatory Visit: Payer: Self-pay | Admitting: Family Medicine

## 2013-07-27 ENCOUNTER — Encounter: Payer: Self-pay | Admitting: Nurse Practitioner

## 2013-07-27 ENCOUNTER — Ambulatory Visit (INDEPENDENT_AMBULATORY_CARE_PROVIDER_SITE_OTHER): Payer: BC Managed Care – PPO | Admitting: Nurse Practitioner

## 2013-07-27 VITALS — BP 112/88 | Temp 98.6°F | Ht 73.0 in | Wt 214.5 lb

## 2013-07-27 DIAGNOSIS — J069 Acute upper respiratory infection, unspecified: Secondary | ICD-10-CM

## 2013-07-27 MED ORDER — HYDROCODONE-HOMATROPINE 5-1.5 MG/5ML PO SYRP: 5.0000 mL | ORAL_SOLUTION | ORAL | Status: DC | PRN

## 2013-07-27 MED ORDER — AZITHROMYCIN 250 MG PO TABS: ORAL_TABLET | ORAL | Status: DC

## 2013-07-29 ENCOUNTER — Encounter: Payer: Self-pay | Admitting: Nurse Practitioner

## 2013-07-29 NOTE — Progress Notes (Signed)
Subjective:  Presents complaints of low-grade fever and cold symptoms for the past 2 days. Slight headache. Cough worse at night, nonproductive. No sore throat or ear pain. No wheezing. No vomiting diarrhea or abdominal pain. Using prescription nasal spray.  Objective:   BP 112/88  Temp(Src) 98.6 F (37 C) (Oral)  Ht 6\' 1"  (1.854 m)  Wt 214 lb 8 oz (97.297 kg)  BMI 28.31 kg/m2 NAD. Alert, oriented. TMs clear effusion, no erythema. Pharynx injected with PND noted. Neck supple with mild soft nontender adenopathy. Lungs clear. Heart regular rate rhythm.  Assessment:Acute upper respiratory infections of unspecified site  Plan: Meds ordered this encounter  Medications  . naproxen sodium (ANAPROX) 220 MG tablet    Sig: Take 220 mg by mouth 2 (two) times daily with a meal.  . Misc Natural Products (OSTEO BI-FLEX JOINT SHIELD PO)    Sig: Take by mouth daily.  Marland Kitchen azithromycin (ZITHROMAX Z-PAK) 250 MG tablet    Sig: Take 2 tablets (500 mg) on  Day 1,  followed by 1 tablet (250 mg) once daily on Days 2 through 5.    Dispense:  6 each    Refill:  0    Order Specific Question:  Supervising Provider    Answer:  Merlyn Albert [[id number]]  . HYDROcodone-homatropine (HYCODAN) 5-1.5 MG/5ML syrup    Sig: Take 5 mLs by mouth every 4 (four) hours as needed.    Dispense:  120 mL    Refill:  0    Order Specific Question:  Supervising Provider    Answer:  Merlyn Albert [[id number]]   OTC meds as directed. Call back in 4-5 days if no improvement, sooner if worse.

## 2013-10-17 ENCOUNTER — Other Ambulatory Visit: Payer: Self-pay | Admitting: Family Medicine

## 2013-11-03 ENCOUNTER — Encounter: Payer: Self-pay | Admitting: Family Medicine

## 2013-11-03 ENCOUNTER — Ambulatory Visit (INDEPENDENT_AMBULATORY_CARE_PROVIDER_SITE_OTHER): Payer: BC Managed Care – PPO | Admitting: Family Medicine

## 2013-11-03 VITALS — BP 126/88 | Temp 98.3°F | Ht 73.0 in | Wt 211.0 lb

## 2013-11-03 DIAGNOSIS — J329 Chronic sinusitis, unspecified: Secondary | ICD-10-CM

## 2013-11-03 DIAGNOSIS — J310 Chronic rhinitis: Secondary | ICD-10-CM

## 2013-11-03 MED ORDER — LEVOFLOXACIN 500 MG PO TABS: 500.0000 mg | ORAL_TABLET | Freq: Every day | ORAL | Status: DC

## 2013-11-03 NOTE — Progress Notes (Signed)
   Subjective:    [name] W Ziller, male    DOB: 16-Jan-1961, 53 y.o.   MRN: [id number]  Cough This is a new problem. The current episode started 1 to 4 weeks ago. Associated symptoms include shortness of breath. Associated symptoms comments: congestion.   Started over a week  Ended up with cong  [place] [facility]  Family has had sickness  Decreased energy  Off and on productive, no fever No nausea no vom  Neg headache clogged up nose at times       Review of Systems  Respiratory: Positive for cough and shortness of breath.    no vomiting no diarrhea no abdominal pain ROS otherwise negative     Objective:   Physical Exam  Alert moderate malaise. Intermittent cough during exam. HEENT moderate nasal congestion frontal tenderness trace normal neck supple. Lungs clear no wheezes no crackles no tachypnea heart regular in rhythm.      Assessment & Plan:  Impression 1 sinusitis/bronchitis plan Levaquin daily 10 days. Symptomatic care discussed. WSL

## 2013-11-21 ENCOUNTER — Other Ambulatory Visit: Payer: Self-pay | Admitting: Family Medicine

## 2014-03-14 ENCOUNTER — Ambulatory Visit (INDEPENDENT_AMBULATORY_CARE_PROVIDER_SITE_OTHER): Payer: BC Managed Care – PPO | Admitting: Family Medicine

## 2014-03-14 ENCOUNTER — Encounter: Payer: Self-pay | Admitting: Family Medicine

## 2014-03-14 ENCOUNTER — Ambulatory Visit: Payer: BC Managed Care – PPO | Admitting: Family Medicine

## 2014-03-14 VITALS — BP 132/80 | Temp 98.2°F | Ht 73.0 in | Wt 214.4 lb

## 2014-03-14 DIAGNOSIS — R21 Rash and other nonspecific skin eruption: Secondary | ICD-10-CM

## 2014-03-14 MED ORDER — DOXYCYCLINE HYCLATE 100 MG PO TABS: 100.0000 mg | ORAL_TABLET | Freq: Two times a day (BID) | ORAL | Status: DC

## 2014-03-14 NOTE — Progress Notes (Signed)
   Subjective:    [name] W Betten, male    DOB: 1961-09-14, 53 y.o.   MRN: [id number]  Rash This is a new problem. The current episode started in the past 7 days. The problem is unchanged. The affected locations include the left arm. The rash is characterized by redness and swelling. He was exposed to an insect bite/sting. Past treatments include nothing. The treatment provided no relief.   Patient states that he has pain in his great toe on his right foot. This has been present for about 1 month now. Recalls no injury but did wear a hard shoe.  No headache no rash elsewhere no fever or good appetite  Started few d ago,   Review of Systems  Skin: Positive for rash.   ROS as noted     Objective:   Physical Exam  Alert no acute distress left shoulder and large erythematous macule 6 cm across no central clearing. Lungs clear. Heart rare rhythm. Distal great toe some dorsal inflammatory changes along with plus minus diminished sensation distally at great toe only      Assessment & Plan:  Impression 1 rash at site of bite patient concerned about potential tickborne illness. Doubtful though theoretically possible. #2 neuropraxia with toe discomfort plan Doxy twice a day 10 symptomatic care discussed. Reassurance WSL

## 2014-04-16 ENCOUNTER — Other Ambulatory Visit: Payer: Self-pay | Admitting: Family Medicine

## 2014-04-17 ENCOUNTER — Telehealth: Payer: Self-pay | Admitting: Family Medicine

## 2014-04-17 DIAGNOSIS — Z125 Encounter for screening for malignant neoplasm of prostate: Secondary | ICD-10-CM

## 2014-04-17 DIAGNOSIS — Z79899 Other long term (current) drug therapy: Secondary | ICD-10-CM

## 2014-04-17 DIAGNOSIS — Z13220 Encounter for screening for lipoid disorders: Secondary | ICD-10-CM

## 2014-04-17 NOTE — Telephone Encounter (Signed)
Patient notified and verbalized understanding. 

## 2014-04-17 NOTE — Telephone Encounter (Signed)
Patient wants to know if he is due for blood work?

## 2014-04-17 NOTE — Telephone Encounter (Signed)
Has not had any labs in EPIC, nor any annual exams.

## 2014-04-17 NOTE — Telephone Encounter (Signed)
Lip liv m7 psa plus wellness exam

## 2014-04-18 LAB — BASIC METABOLIC PANEL
BUN: 16 mg/dL (ref 6–23)
CO2: 27 meq/L (ref 19–32)
CREATININE: 0.78 mg/dL (ref 0.50–1.35)
Calcium: 9.1 mg/dL (ref 8.4–10.5)
Chloride: 104 mEq/L (ref 96–112)
GLUCOSE: 139 mg/dL — AB (ref 70–99)
POTASSIUM: 4 meq/L (ref 3.5–5.3)
Sodium: 139 mEq/L (ref 135–145)

## 2014-04-18 LAB — HEPATIC FUNCTION PANEL
ALT: 26 U/L (ref 0–53)
AST: 19 U/L (ref 0–37)
Albumin: 3.7 g/dL (ref 3.5–5.2)
Alkaline Phosphatase: 73 U/L (ref 39–117)
BILIRUBIN INDIRECT: 0.7 mg/dL (ref 0.2–1.2)
BILIRUBIN TOTAL: 0.8 mg/dL (ref 0.2–1.2)
Bilirubin, Direct: 0.1 mg/dL (ref 0.0–0.3)
Total Protein: 6.4 g/dL (ref 6.0–8.3)

## 2014-04-18 LAB — LIPID PANEL
CHOL/HDL RATIO: 5.2 ratio
Cholesterol: 156 mg/dL (ref 0–200)
HDL: 30 mg/dL — ABNORMAL LOW (ref >39)
LDL CALC: 85 mg/dL (ref 0–99)
Triglycerides: 207 mg/dL — ABNORMAL HIGH (ref <150)
VLDL: 41 mg/dL — ABNORMAL HIGH (ref 0–40)

## 2014-04-19 LAB — PSA: PSA: 0.79 ng/mL (ref <=4.00)

## 2014-04-20 ENCOUNTER — Encounter: Payer: Self-pay | Admitting: Family Medicine

## 2014-04-20 ENCOUNTER — Ambulatory Visit (INDEPENDENT_AMBULATORY_CARE_PROVIDER_SITE_OTHER): Payer: BC Managed Care – PPO | Admitting: Family Medicine

## 2014-04-20 VITALS — BP 128/78 | Ht 73.0 in | Wt 213.2 lb

## 2014-04-20 DIAGNOSIS — E119 Type 2 diabetes mellitus without complications: Secondary | ICD-10-CM

## 2014-04-20 DIAGNOSIS — Z0000 Encounter for general adult medical examination without abnormal findings: Secondary | ICD-10-CM

## 2014-04-20 LAB — POCT GLYCOSYLATED HEMOGLOBIN (HGB A1C): HEMOGLOBIN A1C: 6.2

## 2014-04-20 MED ORDER — TAMSULOSIN HCL 0.4 MG PO CAPS: 0.4000 mg | ORAL_CAPSULE | Freq: Every day | ORAL | Status: DC

## 2014-04-20 NOTE — Progress Notes (Signed)
Subjective:    [name], male    DOB: [birth year], 53 y.o.   MRN: [id number]  HPI The patient comes in today for a wellness visit.    A review of their health history was completed.  A review of medications was also completed.  Any needed refills; no  Eating habits: eats good- trying to drink more water  Falls/  MVA accidents in past few months: no  Regular exercise: very little  Specialist pt sees on regular basis: no  Preventative health issues were discussed.   Additional concerns: no   Stays active not reg exercise  Diet improved has cut down soft drinks and tea, drinking a lot more water   Reflux stabel take one per d  Mo and fa has diabetes, developed in their elderly yrs  Eats overall well,     Results for orders placed in visit on 04/17/14  LIPID PANEL      Result Value Ref Range   Cholesterol 156  0 - 200 mg/dL   Triglycerides 295207 (*) <150 mg/dL   HDL 30 (*) >28>39 mg/dL   Total CHOL/HDL Ratio 5.2     VLDL 41 (*) 0 - 40 mg/dL   LDL Cholesterol 85  0 - 99 mg/dL  HEPATIC FUNCTION PANEL      Result Value Ref Range   Total Bilirubin 0.8  0.2 - 1.2 mg/dL   Bilirubin, Direct 0.1  0.0 - 0.3 mg/dL   Indirect Bilirubin 0.7  0.2 - 1.2 mg/dL   Alkaline Phosphatase 73  39 - 117 U/L   AST 19  0 - 37 U/L   ALT 26  0 - 53 U/L   Total Protein 6.4  6.0 - 8.3 g/dL   Albumin 3.7  3.5 - 5.2 g/dL  BASIC METABOLIC PANEL      Result Value Ref Range   Sodium 139  135 - 145 mEq/L   Potassium 4.0  3.5 - 5.3 mEq/L   Chloride 104  96 - 112 mEq/L   CO2 27  19 - 32 mEq/L   Glucose, Bld 139 (*) 70 - 99 mg/dL   BUN 16  6 - 23 mg/dL   Creat 4.130.78  2.440.50 - 0.101.35 mg/dL   Calcium 9.1  8.4 - 27.210.5 mg/dL  PSA      Result Value Ref Range   PSA 0.79  <=4.00 ng/mL    Review of Systems  Constitutional: Negative for fever, activity change and appetite change.  HENT: Negative for congestion and rhinorrhea.   Eyes: Negative for discharge.  Respiratory: Negative  for cough and wheezing.   Cardiovascular: Negative for chest pain.  Gastrointestinal: Negative for vomiting, abdominal pain and blood in stool.  Genitourinary: Negative for frequency and difficulty urinating.  Musculoskeletal: Negative for neck pain.  Skin: Negative for rash.  Allergic/Immunologic: Negative for environmental allergies and food allergies.  Neurological: Negative for weakness and headaches.  Psychiatric/Behavioral: Negative for agitation.  All other systems reviewed and are negative.      Objective:   Physical Exam  Vitals reviewed. Constitutional: He appears well-developed and well-nourished.  HENT:  Head: Normocephalic and atraumatic.  Right Ear: External ear normal.  Left Ear: External ear normal.  Nose: Nose normal.  Mouth/Throat: Oropharynx is clear and moist.  Eyes: EOM are normal. Pupils are equal, round, and reactive to light.  Neck: Normal range of motion. Neck supple. No thyromegaly present.  Cardiovascular: Normal rate, regular rhythm and normal heart sounds.  No murmur heard. Pulmonary/Chest: Effort normal and breath sounds normal. No respiratory distress. He has no wheezes.  Abdominal: Soft. Bowel sounds are normal. He exhibits no distension and no mass. There is no tenderness.  Genitourinary: Penis normal.  Prostate diffuse mild large no nodules  Musculoskeletal: Normal range of motion. He exhibits no edema.  Lymphadenopathy:    He has no cervical adenopathy.  Neurological: He is alert. He exhibits normal muscle tone.  Skin: Skin is warm and dry. No erythema.  Psychiatric: He has a normal mood and affect. His behavior is normal. Judgment normal.   Distal foot sensation diminished right greater than left       Assessment & Plan:  #1 wellness exam #2 prostate hypertrophy discussed including intervention. #3 impaired fasting glucose. A1c fortunately still well. #4 question early neuropathy sensory. Plan initiate Flomax. Patient work on diet.  Colonoscopy sheet given.

## 2014-05-18 ENCOUNTER — Other Ambulatory Visit: Payer: Self-pay | Admitting: *Deleted

## 2014-05-18 MED ORDER — TAMSULOSIN HCL 0.4 MG PO CAPS: 0.8000 mg | ORAL_CAPSULE | Freq: Every day | ORAL | Status: DC

## 2014-06-19 ENCOUNTER — Telehealth: Payer: Self-pay | Admitting: Family Medicine

## 2014-06-19 NOTE — Telephone Encounter (Signed)
tamsulosin (FLOMAX) 0.4 MG CAPS capsule  Pt calling to say that this med is not working so well for him. He had doubled the dose and is still not helping.    Wants to know if he can try a different med?   [facility]

## 2014-06-19 NOTE — Telephone Encounter (Signed)
Notified patient no other medication in this class will help, needs to take a testosterone blocking med that works on the prostate only. Recommend ov to discuss pros cons se's benefits etc. Transferred patient to front desk to schedule appointment.

## 2014-06-19 NOTE — Telephone Encounter (Signed)
No other med in this class will help, needs to take a testosterone blocking med that works on the prost only. rec ov to discuss pros cons se's benefits etc

## 2014-06-25 ENCOUNTER — Encounter: Payer: Self-pay | Admitting: Family Medicine

## 2014-06-25 ENCOUNTER — Ambulatory Visit (INDEPENDENT_AMBULATORY_CARE_PROVIDER_SITE_OTHER): Payer: BC Managed Care – PPO | Admitting: Family Medicine

## 2014-06-25 VITALS — BP 128/80 | Ht 73.0 in | Wt 218.0 lb

## 2014-06-25 DIAGNOSIS — N400 Enlarged prostate without lower urinary tract symptoms: Secondary | ICD-10-CM

## 2014-06-25 DIAGNOSIS — K219 Gastro-esophageal reflux disease without esophagitis: Secondary | ICD-10-CM | POA: Insufficient documentation

## 2014-06-25 MED ORDER — DUTASTERIDE 0.5 MG PO CAPS
0.5000 mg | ORAL_CAPSULE | Freq: Every day | ORAL | Status: DC
Start: 2014-06-25 — End: 2015-03-08

## 2014-06-25 NOTE — Progress Notes (Signed)
   Subjective:    [name], male    DOB: 08/10/1961, 53 y.o.   MRN: [id number]  HPI Patient is here today to discuss his medication. Patient states that he has been taking flomax and it is not helping his symptoms at all. He would like to discuss other options and his pros and cons he would he facing.   Patient states he has no other concerns at this time.   Urinating not good, took wto of the flomax daily  And it did not help the urination freq, tho it did help the urine flow  Still most nights getting up very frequently to urinate.   Review of Systems No abdominal pain no chest pain no dysuria no back pain no hematuria    Objective:   Physical Exam  Alert no apparent distress lungs clear heart rare regular rate and rhythm abdomen benign      Assessment & Plan:  Impression prostate hypertrophy suboptimum with cough one blocker plan add Avodart side effects benefits discussed. WSL

## 2014-07-24 ENCOUNTER — Telehealth: Payer: Self-pay | Admitting: Family Medicine

## 2014-07-24 NOTE — Telephone Encounter (Signed)
°  Pt states that [place]17 [place] [facility] [place] [facility] was  166/98 The [facility] Nurse at that tent told him to have it monitored for a bit so he did   10/19 144/98 10/21 138/98 10/23  138/98 10/26  130/98 11/2     130/98  Wants to know if there is anything to be concerned about? Does he need to be seen or meds Readjusted?

## 2014-07-24 NOTE — Telephone Encounter (Signed)
Ov next wk 

## 2014-07-25 NOTE — Telephone Encounter (Signed)
Office visit scheduled to follow up on BP.

## 2014-08-01 ENCOUNTER — Ambulatory Visit (INDEPENDENT_AMBULATORY_CARE_PROVIDER_SITE_OTHER): Payer: BC Managed Care – PPO | Admitting: Family Medicine

## 2014-08-01 ENCOUNTER — Encounter: Payer: Self-pay | Admitting: Family Medicine

## 2014-08-01 VITALS — BP 140/92 | Ht 73.0 in | Wt 220.2 lb

## 2014-08-01 DIAGNOSIS — R030 Elevated blood-pressure reading, without diagnosis of hypertension: Secondary | ICD-10-CM

## 2014-08-01 DIAGNOSIS — IMO0001 Reserved for inherently not codable concepts without codable children: Secondary | ICD-10-CM | POA: Insufficient documentation

## 2014-08-01 NOTE — Progress Notes (Signed)
   Subjective:    [name], male    DOB: 01/26/1961, 53 y.o.   MRN: [id number]  Hypertension This is a new problem. The current episode started 1 to 4 weeks ago. The problem is unchanged. The problem is uncontrolled. There are no associated agents to hypertension. There are no known risk factors for coronary artery disease. Past treatments include nothing. The current treatment provides no improvement. There are no compliance problems.   Patient states that the medication he is taking for his prostate may be contributing to his blood pressure issue.   The medication for his prostate does not seem to be helping because he is still going to the bathroom 6-8 times every night.   No abdominal pain  Review of Systems No headache no chest pain no back pain no hematuria    Objective:   Physical Exam  Alert no apparent distress. Lungs clear. Heart regular rate and rhythm. Blood pressure still elevated 142/90      Assessment & Plan:  Impression #1 elevated blood pressure discuss unlikely related to #2 #2 perceived reaction to Avodart Plan stop Avodartcut down salt intake. Monitor blood pressure. If numbers persists this elevated will need further workup. And intervention. Urology referral offered patient to consider not wanting to do it now. WSL

## 2014-08-01 NOTE — Patient Instructions (Signed)
plz stop avodart, continue blood pressure checks if over next several mo bp stays up bottom number ninety or higher, top number over 140 most of the time

## 2014-08-13 ENCOUNTER — Other Ambulatory Visit: Payer: Self-pay | Admitting: Family Medicine

## 2014-10-22 ENCOUNTER — Telehealth: Payer: Self-pay

## 2014-10-30 ENCOUNTER — Other Ambulatory Visit: Payer: Self-pay

## 2014-10-30 ENCOUNTER — Telehealth: Payer: Self-pay

## 2014-10-30 DIAGNOSIS — Z1211 Encounter for screening for malignant neoplasm of colon: Secondary | ICD-10-CM

## 2014-10-30 MED ORDER — PEG-KCL-NACL-NASULF-NA ASC-C 100 G PO SOLR: 1.0000 | ORAL | Status: DC

## 2014-10-30 NOTE — Telephone Encounter (Signed)
I called [employer] [phone] and spoke to Wonda CeriseKay J who said that a PA is not required for a screening colonoscopy.

## 2014-10-30 NOTE — Telephone Encounter (Signed)
Appropriate.

## 2014-10-30 NOTE — Telephone Encounter (Signed)
Gastroenterology Pre-Procedure Review  Request Date:10/22/2014 Requesting Physician:   PT JUST SAID HE IS PAST DUE FOR HIS FIRST COLONOSCOPY/ NO PROBLEMS  PATIENT REVIEW QUESTIONS: The patient responded to the following health history questions as indicated:    1. Diabetes Melitis: no 2. Joint replacements in the past 12 months: no 3. Major health problems in the past 3 months: no 4. Has an artificial valve or MVP: no 5. Has a defibrillator: no 6. Has been advised in past to take antibiotics in advance of a procedure like teeth cleaning: no    MEDICATIONS & ALLERGIES:    Patient reports the following regarding taking any blood thinners:   Plavix? no Aspirin? no Coumadin? no  Patient confirms/reports the following medications:  Current Outpatient Prescriptions  Medication Sig Dispense Refill  . fluticasone (FLONASE) 50 MCG/ACT nasal spray 2 SPRAYS INTO BOTH NOSTRILS ONCE DAILY AS NEEDED. 16 g 3  . Misc Natural Products (OSTEO BI-FLEX JOINT SHIELD PO) Take by mouth daily.    . naproxen sodium (ANAPROX) 220 MG tablet Take 220 mg by mouth 2 (two) times daily with a meal.    . omeprazole (PRILOSEC) 20 MG capsule TAKE (1) CAPSULE TWICE DAILY BEFORE MEALS. (Patient taking differently: take one tablet daily) 60 capsule 5  . tamsulosin (FLOMAX) 0.4 MG CAPS capsule Take 2 capsules (0.8 mg total) by mouth at bedtime. 60 capsule 5  . dutasteride (AVODART) 0.5 MG capsule Take 1 capsule (0.5 mg total) by mouth daily. (Patient not taking: Reported on 10/22/2014) 30 capsule 11   No current facility-administered medications for this visit.    Patient confirms/reports the following allergies:  Allergies  Allergen Reactions  . Dilaudid [Hydromorphone Hcl] Other (See Comments)    PT SAID HE PASSED OUT WHEN HE TOOK IT    No orders of the defined types were placed in this encounter.    AUTHORIZATION INFORMATION Primary Insurance:   ID #:  Group #:  Pre-Cert / Auth required:  Pre-Cert / Auth #:    Secondary Insurance:   ID #:   Group #:  Pre-Cert / Auth required: Pre-Cert / Auth #:   SCHEDULE INFORMATION: Procedure has been scheduled as follows:  Date: 11/30/2014                  Time: 7:30 am Location: [facility] Short Stay  This Gastroenterology Pre-Precedure Review Form is being routed to the following provider(s): R. Roetta SessionsMichael Rourk, MD

## 2014-10-30 NOTE — Telephone Encounter (Signed)
Rx sent to the pharmacy and instructions mailed to pt.  

## 2014-10-31 ENCOUNTER — Telehealth: Payer: Self-pay

## 2014-10-31 MED ORDER — PEG 3350-KCL-NA BICARB-NACL 420 G PO SOLR: 4000.0000 mL | ORAL | Status: DC

## 2014-10-31 NOTE — Telephone Encounter (Signed)
Movie prep too expensive. Trilyle sent to pharmacy and new instructions mailed to pt. PT is aware.

## 2014-10-31 NOTE — Addendum Note (Signed)
Addended by: Lavena BullionSTEWART, Dameion Briles H on: 10/31/2014 04:51 PM   Modules accepted: Orders

## 2014-11-29 ENCOUNTER — Telehealth: Payer: Self-pay

## 2014-11-29 NOTE — Telephone Encounter (Signed)
Called pt to update triage. He has not had any change in his meds.  Routing to Tana CoastLeslie Lewis, PA in Gerrit HallsAnna Sams, NP, absence.

## 2014-11-29 NOTE — Telephone Encounter (Signed)
Noted. Ok for procedure.

## 2014-11-30 ENCOUNTER — Encounter (HOSPITAL_COMMUNITY)
Admission: RE | Disposition: A | Discharge status: Home / Self Care | Payer: Self-pay | Source: Ambulatory Visit | Attending: Internal Medicine

## 2014-11-30 ENCOUNTER — Ambulatory Visit (HOSPITAL_COMMUNITY)
Admission: RE | Admit: 2014-11-30 | Discharge: 2014-11-30 | Disposition: A | Discharge status: Home / Self Care | Payer: BLUE CROSS/BLUE SHIELD | Source: Ambulatory Visit | Attending: Internal Medicine | Admitting: Internal Medicine

## 2014-11-30 ENCOUNTER — Encounter (HOSPITAL_COMMUNITY): Payer: Self-pay | Admitting: *Deleted

## 2014-11-30 DIAGNOSIS — K5730 Diverticulosis of large intestine without perforation or abscess without bleeding: Secondary | ICD-10-CM | POA: Diagnosis not present

## 2014-11-30 DIAGNOSIS — Z9089 Acquired absence of other organs: Secondary | ICD-10-CM | POA: Diagnosis not present

## 2014-11-30 DIAGNOSIS — Z791 Long term (current) use of non-steroidal anti-inflammatories (NSAID): Secondary | ICD-10-CM | POA: Insufficient documentation

## 2014-11-30 DIAGNOSIS — Z7951 Long term (current) use of inhaled steroids: Secondary | ICD-10-CM | POA: Insufficient documentation

## 2014-11-30 DIAGNOSIS — Z1211 Encounter for screening for malignant neoplasm of colon: Secondary | ICD-10-CM | POA: Insufficient documentation

## 2014-11-30 DIAGNOSIS — Z79899 Other long term (current) drug therapy: Secondary | ICD-10-CM | POA: Insufficient documentation

## 2014-11-30 HISTORY — DX: Reserved for inherently not codable concepts without codable children: IMO0001

## 2014-11-30 HISTORY — PX: COLONOSCOPY: SHX5424

## 2014-11-30 SURGERY — COLONOSCOPY
Anesthesia: Moderate Sedation

## 2014-11-30 MED ORDER — ONDANSETRON HCL 4 MG/2ML IJ SOLN
INTRAMUSCULAR | Status: AC
  Filled 2014-11-30: qty 2

## 2014-11-30 MED ORDER — ONDANSETRON HCL 4 MG/2ML IJ SOLN
INTRAMUSCULAR | Status: DC | PRN
  Administered 2014-11-30: 4 mg via INTRAVENOUS

## 2014-11-30 MED ORDER — MEPERIDINE HCL 100 MG/ML IJ SOLN
INTRAMUSCULAR | Status: DC | PRN
  Administered 2014-11-30: 50 mg via INTRAVENOUS

## 2014-11-30 MED ORDER — MIDAZOLAM HCL 5 MG/5ML IJ SOLN
INTRAMUSCULAR | Status: DC | PRN
  Administered 2014-11-30 (×2): 1 mg via INTRAVENOUS
  Administered 2014-11-30: 2 mg via INTRAVENOUS

## 2014-11-30 MED ORDER — MEPERIDINE HCL 100 MG/ML IJ SOLN
INTRAMUSCULAR | Status: AC
  Filled 2014-11-30: qty 2

## 2014-11-30 MED ORDER — SODIUM CHLORIDE 0.9 % IV SOLN
INTRAVENOUS | Status: DC
  Administered 2014-11-30: 07:00:00 via INTRAVENOUS

## 2014-11-30 MED ORDER — MIDAZOLAM HCL 5 MG/5ML IJ SOLN
INTRAMUSCULAR | Status: AC
  Filled 2014-11-30: qty 10

## 2014-11-30 MED ORDER — STERILE WATER FOR IRRIGATION IR SOLN
Status: DC | PRN
  Administered 2014-11-30: 07:00:00

## 2014-11-30 NOTE — Op Note (Signed)
[facility] [address] [place], [id number]   COLONOSCOPY PROCEDURE REPORT  PATIENT: [name] [name], Mylz W  MR#: [id number] BIRTHDATE: 1961-01-03 , 53  yrs. old GENDER: male ENDOSCOPIST: R.  Roetta SessionsMichael Linde Wilensky, MD FACP [facility] REFERRED XB:JYNWGNFBY:Stephen Gerda DissLuking, M.D. PROCEDURE DATE:  11/30/2014 PROCEDURE:   Colonoscopy, screening INDICATIONS:First ever average risk colorectal cancer screening examination. MEDICATIONS: Versed 4 mg IV and Demerol 50 mg IV in divided doses. Zofran 4 mg IV. ASA CLASS:       Class II  CONSENT: The risks, benefits, alternatives and imponderables including but not limited to bleeding, perforation as well as the possibility of a missed lesion have been reviewed.  The potential for biopsy, lesion removal, etc. have also been discussed. Questions have been answered.  All parties agreeable.  Please see the history and physical in the medical record for more information.  DESCRIPTION OF PROCEDURE:   After the risks benefits and alternatives of the procedure were thoroughly explained, informed consent was obtained.  The digital rectal exam      The EC-[id number] ([id number]  endoscope was introduced through the anus and advanced to the   . No adverse events experienced.   The quality of the prep was adequate  The instrument was then slowly withdrawn as the colon was fully examined.      COLON FINDINGS: Normal-appearing rectal mucosa.  Scattered pancolonic diverticula (left-sided greater than right); the remainder the colonic mucosa appeared normal.  Retroflexion was performed. .  Withdrawal time=10 minutes 0 seconds.  The scope was withdrawn and the procedure completed. COMPLICATIONS: There were no immediate complications.  ENDOSCOPIC IMPRESSION: Colonic diverticulosis  RECOMMENDATIONS: Repeat screening colonoscopy in 10 years  eSigned:  R. Roetta SessionsMichael Jamaira Sherk, MD Jerrel IvoryFACP [facility] 11/30/2014 8:13 AM   cc:  CPT CODES: ICD CODES:  The ICD and CPT codes  recommended by this software are interpretations from the data that the clinical staff has captured with the software.  The verification of the translation of this report to the ICD and CPT codes and modifiers is the sole responsibility of the health care institution and practicing physician where this report was generated.  [employer] will not be held responsible for the validity of the ICD and CPT codes included on this report.  AMA assumes no liability for data contained or not contained herein. CPT is a Publishing rights managerregistered trademark of the [employer].  PATIENT NAME:  [name] [name], Ankith W MR#: [id number]

## 2014-11-30 NOTE — Discharge Instructions (Addendum)
Colonoscopy Discharge Instructions  Read the instructions outlined below and refer to this sheet in the next few weeks. These discharge instructions provide you with general information on caring for yourself after you leave the hospital. Your doctor may also give you specific instructions. While your treatment has been planned according to the most current medical practices available, unavoidable complications occasionally occur. If you have any problems or questions after discharge, call Dr. Jena Gaussourk at [phone]. ACTIVITY  You may resume your regular activity, but move at a slower pace for the next 24 hours.   Take frequent rest periods for the next 24 hours.   Walking will help get rid of the air and reduce the bloated feeling in your belly (abdomen).   No driving for 24 hours (because of the medicine (anesthesia) used during the test).    Do not sign any important legal documents or operate any machinery for 24 hours (because of the anesthesia used during the test).  NUTRITION  Drink plenty of fluids.   You may resume your normal diet as instructed by your doctor.   Begin with a light meal and progress to your normal diet. Heavy or fried foods are harder to digest and may make you feel sick to your stomach (nauseated).   Avoid alcoholic beverages for 24 hours or as instructed.  MEDICATIONS  You may resume your normal medications unless your doctor tells you otherwise.  WHAT YOU CAN EXPECT TODAY  Some feelings of bloating in the abdomen.   Passage of more gas than usual.   Spotting of blood in your stool or on the toilet paper.  IF YOU HAD POLYPS REMOVED DURING THE COLONOSCOPY:  No aspirin products for 7 days or as instructed.   No alcohol for 7 days or as instructed.   Eat a soft diet for the next 24 hours.  FINDING OUT THE RESULTS OF YOUR TEST Not all test results are available during your visit. If your test results are not back during the visit, make an appointment  with your caregiver to find out the results. Do not assume everything is normal if you have not heard from your caregiver or the medical facility. It is important for you to follow up on all of your test results.  SEEK IMMEDIATE MEDICAL ATTENTION IF:  You have more than a spotting of blood in your stool.   Your belly is swollen (abdominal distention).   You are nauseated or vomiting.   You have a temperature over 101.   You have abdominal pain or discomfort that is severe or gets worse throughout the day.    Diverticulosis information provided  Recommend repeat screening colonoscopy in 10 years   Diverticulosis Diverticulosis is the condition that develops when small pouches (diverticula) form in the wall of your colon. Your colon, or large intestine, is where water is absorbed and stool is formed. The pouches form when the inside layer of your colon pushes through weak spots in the outer layers of your colon. CAUSES  No one knows exactly what causes diverticulosis. RISK FACTORS Being older than 50. Your risk for this condition increases with age. Diverticulosis is rare in people younger than 40 years. By age 380, almost everyone has it. Eating a low-fiber diet. Being frequently constipated. Being overweight. Not getting enough exercise. Smoking. Taking over-the-counter pain medicines, like aspirin and ibuprofen. SYMPTOMS  Most people with diverticulosis do not have symptoms. DIAGNOSIS  Because diverticulosis often has no symptoms, health care providers often discover  the condition during an exam for other colon problems. In many cases, a health care provider will diagnose diverticulosis while using a flexible scope to examine the colon (colonoscopy). TREATMENT  If you have never developed an infection related to diverticulosis, you may not need treatment. If you have had an infection before, treatment may include: Eating more fruits, vegetables, and grains. Taking a fiber  supplement. Taking a live bacteria supplement (probiotic). Taking medicine to relax your colon. HOME CARE INSTRUCTIONS  Drink at least 6-8 glasses of water each day to prevent constipation. Try not to strain when you have a bowel movement. Keep all follow-up appointments. If you have had an infection before: Increase the fiber in your diet as directed by your health care provider or dietitian. Take a dietary fiber supplement if your health care provider approves. Only take medicines as directed by your health care provider. SEEK MEDICAL CARE IF:  You have abdominal pain. You have bloating. You have cramps. You have not gone to the bathroom in 3 days. SEEK IMMEDIATE MEDICAL CARE IF:  Your pain gets worse. Yourbloating becomes very bad. You have a fever or chills, and your symptoms suddenly get worse. You begin vomiting. You have bowel movements that are bloody or black. MAKE SURE YOU: Understand these instructions. Will watch your condition. Will get help right away if you are not doing well or get worse. Document Released: 06/04/2004 Document Revised: 09/12/2013 Document Reviewed: 08/02/2013 [facility] Patient Information 2015 [place], [place]. This information is not intended to replace advice given to you by your health care provider. Make sure you discuss any questions you have with your health care provider.

## 2014-11-30 NOTE — H&P (Signed)
$'@LOGO'P$ @   Primary Care Physician:  Rubbie Battiest, MD Primary Gastroenterologist:  Dr. Gala Romney  Pre-Procedure History & Physical: HPI:  [name] [name] is a 54 y.o. male is here for a screening colonoscopy. No bowel symptoms. No family history of colon cancer. No prior colonoscopy.  Past Medical History  Diagnosis Date  . Shortness of breath dyspnea     Past Surgical History  Procedure Laterality Date  . Cholecystectomy    . Vascular surgery    . Hernia repair      1981    Prior to Admission medications   Medication Sig Start Date End Date Taking? Authorizing Provider  fluticasone (FLONASE) 50 MCG/ACT nasal spray 2 SPRAYS INTO BOTH NOSTRILS ONCE DAILY AS NEEDED. Patient taking differently: 2 SPRAYS INTO BOTH NOSTRILS ONCE DAILY AS NEEDED CONGESTION 08/13/14  Yes Kathyrn Drown, MD  Misc Natural Products (OSTEO BI-FLEX JOINT SHIELD PO) Take 2 tablets by mouth daily.    Yes Historical Provider, MD  naproxen sodium (ANAPROX) 220 MG tablet Take 440 mg by mouth daily.    Yes Historical Provider, MD  omeprazole (PRILOSEC) 20 MG capsule TAKE (1) CAPSULE TWICE DAILY BEFORE MEALS. Patient taking differently: take one tablet daily 04/16/14  Yes Kathyrn Drown, MD  polyethylene glycol-electrolytes (TRILYTE) 420 G solution Take 4,000 mLs by mouth as directed. 10/31/14  Yes Daneil Dolin, MD  tamsulosin (FLOMAX) 0.4 MG CAPS capsule Take 2 capsules (0.8 mg total) by mouth at bedtime. 05/18/14  Yes Mikey Kirschner, MD  dutasteride (AVODART) 0.5 MG capsule Take 1 capsule (0.5 mg total) by mouth daily. Patient not taking: Reported on 10/22/2014 06/25/14   Mikey Kirschner, MD  peg 3350 powder (MOVIPREP) 100 G SOLR Take 1 kit (200 g total) by mouth as directed. 10/30/14   Daneil Dolin, MD    Allergies as of 10/30/2014 - Review Complete 10/22/2014  Allergen Reaction Noted  . Dilaudid [hydromorphone hcl] Other (See Comments) 10/22/2014    History reviewed. No pertinent family history.  History    Social History  . Marital Status: Married    Spouse Name: N/A  . Number of Children: N/A  . Years of Education: N/A   Occupational History  . Not on file.   Social History Main Topics  . Smoking status: Never Smoker   . Smokeless tobacco: Not on file  . Alcohol Use: No  . Drug Use: No  . Sexual Activity: No   Other Topics Concern  . Not on file   Social History Narrative    Review of Systems: See HPI, otherwise negative ROS  Physical Exam: BP 145/94 mmHg  Pulse 62  Temp(Src) 97.7 F (36.5 C) (Oral)  Resp 21  Ht $R'6\' 1"'XF$  (1.854 m)  Wt 218 lb (98.884 kg)  BMI 28.77 kg/m2  SpO2 95% General:   Alert,  Well-developed, well-nourished, pleasant and cooperative in NAD Head:  Normocephalic and atraumatic. Eyes:  Sclera clear, no icterus.   Conjunctiva pink. Ears:  Normal auditory acuity. Nose:  No deformity, discharge,  or lesions. Mouth:  No deformity or lesions, dentition normal. Neck:  Supple; no masses or thyromegaly. Lungs:  Clear throughout to auscultation.   No wheezes, crackles, or rhonchi. No acute distress. Heart:  Regular rate and rhythm; no murmurs, clicks, rubs,  or gallops. Abdomen:  Soft, nontender and nondistended. No masses, hepatosplenomegaly or hernias noted. Normal bowel sounds, without guarding, and without rebound.   Msk:  Symmetrical without gross deformities. Normal posture. Pulses:  Normal pulses noted. Extremities:  Without clubbing or edema. Neurologic:  Alert and  oriented x4;  grossly normal neurologically. Skin:  Intact without significant lesions or rashes. Cervical Nodes:  No significant cervical adenopathy. Psych:  Alert and cooperative. Normal mood and affect.  Impression/Plan: [name] [name] is now here to undergo a screening colonoscopy.  First ever average risk screening examination. Risks, benefits, limitations, imponderables and alternatives regarding colonoscopy have been reviewed with the patient. Questions have been answered. All  parties agreeable.     Notice:  This dictation was prepared with Dragon dictation along with smaller phrase technology. Any transcriptional errors that result from this process are unintentional and may not be corrected upon review.

## 2014-12-03 ENCOUNTER — Encounter (HOSPITAL_COMMUNITY): Payer: Self-pay | Admitting: Internal Medicine

## 2014-12-06 ENCOUNTER — Other Ambulatory Visit: Payer: Self-pay | Admitting: Family Medicine

## 2015-02-28 ENCOUNTER — Telehealth: Payer: Self-pay | Admitting: Family Medicine

## 2015-02-28 NOTE — Telephone Encounter (Signed)
Dr Reynolds Bowl ran labs on [name] [name] an wanted you to be aware of the results, please Advise pt once reviewed

## 2015-03-01 NOTE — Telephone Encounter (Signed)
Where is blood work? Dr who?

## 2015-03-04 NOTE — Telephone Encounter (Signed)
Does anyone know where these labs are? They were never forwarded to the nurse's station and they are not in the doctor's office.

## 2015-03-06 ENCOUNTER — Other Ambulatory Visit: Payer: Self-pay | Admitting: Family Medicine

## 2015-03-06 NOTE — Telephone Encounter (Signed)
Notified patient one liver enzyme elevated slightly, recommend office visit to discuss approach. Patient was transferred to front desk to schedule appointment.

## 2015-03-06 NOTE — Telephone Encounter (Signed)
It came across my desk this weekend one liv enzyme elevated slightly, call pt, rec o v to disc approach

## 2015-03-06 NOTE — Telephone Encounter (Signed)
Needs office visit.

## 2015-03-08 ENCOUNTER — Encounter: Payer: Self-pay | Admitting: Family Medicine

## 2015-03-08 ENCOUNTER — Ambulatory Visit (INDEPENDENT_AMBULATORY_CARE_PROVIDER_SITE_OTHER): Payer: BLUE CROSS/BLUE SHIELD | Admitting: Family Medicine

## 2015-03-08 VITALS — BP 126/88 | Ht 73.0 in | Wt 218.0 lb

## 2015-03-08 DIAGNOSIS — Z13220 Encounter for screening for lipoid disorders: Secondary | ICD-10-CM | POA: Diagnosis not present

## 2015-03-08 DIAGNOSIS — R748 Abnormal levels of other serum enzymes: Secondary | ICD-10-CM

## 2015-03-08 DIAGNOSIS — Z125 Encounter for screening for malignant neoplasm of prostate: Secondary | ICD-10-CM | POA: Diagnosis not present

## 2015-03-08 DIAGNOSIS — I10 Essential (primary) hypertension: Secondary | ICD-10-CM | POA: Diagnosis not present

## 2015-03-08 MED ORDER — DUTASTERIDE 0.5 MG PO CAPS: 0.5000 mg | ORAL_CAPSULE | Freq: Every day | ORAL | Status: DC

## 2015-03-08 NOTE — Progress Notes (Signed)
   Subjective:    [name], male    DOB: 04-29-1961, 54 y.o.   MRN: [id number] Testing 123 patient arrives office with several concerns.   HPIpt following up on bloodwork that showed elevated liver enzyme.   Pt stopped taking avodart. Now he wants to discuss restarting this med. States the medicine definitely seemed to help.  BP numbers ovdall good,  Pt had fungus on toe, had b w which revealed elev liver enzyme  Was given samples for foot,  Still having thick nails of the foot  Pos family hx of elevated iron   Patient has history of elevated cholesterol. Has been working on it some. Not a lot.  Ongoing challenges with reflux. Medication seems to help.  Reports and aggravating skin tag right for head. Would like to have it removed.  Review of Systems No headache no chest pain no back pain some reflux no change in bowel habits    Objective:   Physical Exam Alert vitals stable blood pressure good on repeat lungs clear heart regular in rhythm abdomen benign       Assessment & Plan:  Impression 1 elevated blood pressure resolved #2 reflux discussed #3 prostate hypertrophy discussed will resume Avodart No. 4 elevated liver enzyme plan recheck blood work. Liver enzymes to up will need further workup including blood work and ultrasound discussed at length. Maintain other medicines. Resume Avodart. Diet exercise discussed WSL

## 2015-03-13 LAB — PSA: Prostate Specific Ag, Serum: 0.7 ng/mL (ref 0.0–4.0)

## 2015-03-13 LAB — HEPATIC FUNCTION PANEL
ALT: 56 IU/L — AB (ref 0–44)
AST: 36 IU/L (ref 0–40)
Albumin: 3.8 g/dL (ref 3.5–5.5)
Alkaline Phosphatase: 98 IU/L (ref 39–117)
BILIRUBIN TOTAL: 1.3 mg/dL — AB (ref 0.0–1.2)
Bilirubin, Direct: 0.36 mg/dL (ref 0.00–0.40)
Total Protein: 6.2 g/dL (ref 6.0–8.5)

## 2015-03-13 LAB — LIPID PANEL
CHOLESTEROL TOTAL: 174 mg/dL (ref 100–199)
Chol/HDL Ratio: 5.6 ratio units — ABNORMAL HIGH (ref 0.0–5.0)
HDL: 31 mg/dL — AB (ref >39)
LDL Calculated: 110 mg/dL — ABNORMAL HIGH (ref 0–99)
Triglycerides: 165 mg/dL — ABNORMAL HIGH (ref 0–149)
VLDL CHOLESTEROL CAL: 33 mg/dL (ref 5–40)

## 2015-03-13 LAB — BASIC METABOLIC PANEL
BUN / CREAT RATIO: 19 (ref 9–20)
BUN: 15 mg/dL (ref 6–24)
CO2: 25 mmol/L (ref 18–29)
Calcium: 9 mg/dL (ref 8.7–10.2)
Chloride: 98 mmol/L (ref 97–108)
Creatinine, Ser: 0.78 mg/dL (ref 0.76–1.27)
GFR calc non Af Amer: 102 mL/min/{1.73_m2} (ref >59)
GFR, EST AFRICAN AMERICAN: 117 mL/min/{1.73_m2} (ref >59)
GLUCOSE: 304 mg/dL — AB (ref 65–99)
POTASSIUM: 4.2 mmol/L (ref 3.5–5.2)
Sodium: 137 mmol/L (ref 134–144)

## 2015-03-20 ENCOUNTER — Encounter: Payer: Self-pay | Admitting: Family Medicine

## 2015-03-20 ENCOUNTER — Ambulatory Visit (INDEPENDENT_AMBULATORY_CARE_PROVIDER_SITE_OTHER): Payer: BLUE CROSS/BLUE SHIELD | Admitting: Family Medicine

## 2015-03-20 VITALS — BP 122/82 | Ht 73.0 in | Wt 215.0 lb

## 2015-03-20 DIAGNOSIS — R748 Abnormal levels of other serum enzymes: Secondary | ICD-10-CM

## 2015-03-20 DIAGNOSIS — IMO0001 Reserved for inherently not codable concepts without codable children: Secondary | ICD-10-CM

## 2015-03-20 DIAGNOSIS — R030 Elevated blood-pressure reading, without diagnosis of hypertension: Secondary | ICD-10-CM

## 2015-03-20 DIAGNOSIS — R739 Hyperglycemia, unspecified: Secondary | ICD-10-CM

## 2015-03-20 DIAGNOSIS — E119 Type 2 diabetes mellitus without complications: Secondary | ICD-10-CM | POA: Insufficient documentation

## 2015-03-20 LAB — POCT GLYCOSYLATED HEMOGLOBIN (HGB A1C): HEMOGLOBIN A1C: 9.1

## 2015-03-20 MED ORDER — METFORMIN HCL 500 MG PO TABS: ORAL_TABLET | ORAL | Status: DC

## 2015-03-20 MED ORDER — BLOOD GLUCOSE MONITOR KIT: PACK | Status: DC

## 2015-03-20 NOTE — Progress Notes (Signed)
   Subjective:    [name], male    DOB: 01/24/1961, 54 y.o.   MRN: [id number]  HPI  Patient arrives to discuss recent abnormal labs. Patient's fasting sugar was over 300.   Still eats fried foods and drinks or tea at lunch  o j in the morn, puts a little sugar on the oatmeal  Eats cracker s for lunch   Tries to eat a few cookies. Ea night  Splits pie on occasion   More urin at night and thirsty  Needs more water   Patient notes strong family history of diabetes. Both parents had diabetes.  Admits to excessive sugars and diet times. Next  Not exercising much these days.   Had a substantial glucose intolerance last year with an A1c of 6.2 so is aware he may move into diabetes diagnosis.  More thirst increased urination and slight blurred vision of late.     Results for orders placed or performed in visit on 03/20/15  POCT glycosylated hemoglobin (Hb A1C)  Result Value Ref Range   Hemoglobin A1C 9.1      Review of Systems No headache no chest pain and back pain no abdominal pain no change in bowel habits no blood in stool    Objective:   Physical Exam  Alert vital stable HEENT normal. Blood pressure good on repeat lungs clear heart regular in rhythm. Ankles without edema pulses intact sensation good      Assessment & Plan:  Impression type 2 diabetes new onset. Discussed at length. A1c 9.1%. This patient does fall under the category of 1 needing medication right away. Discussed at length. Plan glucometer. Chest sugars several times per week. Initiate metformin 500 twice a day for 7 days then 2 twice a day. To not skip meals. Diet discussed. Exercise and weight loss discussed. Educational sessions before. Yearly eye exams important. 25 minutes spent most in discussion WSL

## 2015-03-20 NOTE — Patient Instructions (Signed)
Yearly eye exams is a good idea  We will start med today to help get these sugars under control

## 2015-03-26 ENCOUNTER — Telehealth: Payer: Self-pay | Admitting: General Practice

## 2015-03-26 ENCOUNTER — Other Ambulatory Visit: Payer: Self-pay | Admitting: Family Medicine

## 2015-03-26 NOTE — Telephone Encounter (Signed)
I received a call from the patient's wife wanting to know why [employer] did not pay for her husband's tcs at 100%.  [facility] and she stated that according to her records the claim was coded correctly and she was going to resend the claim for review.  Reference#[id number]  I gave Mrs. Ojala the reference number for the above call and told her to check back with them in about a week.

## 2015-03-29 ENCOUNTER — Encounter: Payer: Self-pay | Admitting: Internal Medicine

## 2015-06-21 ENCOUNTER — Encounter: Payer: Self-pay | Admitting: Family Medicine

## 2015-06-21 ENCOUNTER — Ambulatory Visit (HOSPITAL_COMMUNITY)
Admission: RE | Admit: 2015-06-21 | Discharge: 2015-06-21 | Disposition: A | Discharge status: Home / Self Care | Payer: BLUE CROSS/BLUE SHIELD | Source: Ambulatory Visit | Attending: Family Medicine | Admitting: Family Medicine

## 2015-06-21 ENCOUNTER — Ambulatory Visit (INDEPENDENT_AMBULATORY_CARE_PROVIDER_SITE_OTHER): Payer: BLUE CROSS/BLUE SHIELD | Admitting: Family Medicine

## 2015-06-21 VITALS — BP 130/90 | Ht 73.0 in | Wt 207.0 lb

## 2015-06-21 DIAGNOSIS — M25561 Pain in right knee: Secondary | ICD-10-CM

## 2015-06-21 DIAGNOSIS — I10 Essential (primary) hypertension: Secondary | ICD-10-CM | POA: Diagnosis not present

## 2015-06-21 DIAGNOSIS — M25461 Effusion, right knee: Secondary | ICD-10-CM | POA: Insufficient documentation

## 2015-06-21 DIAGNOSIS — E119 Type 2 diabetes mellitus without complications: Secondary | ICD-10-CM | POA: Diagnosis not present

## 2015-06-21 LAB — POCT GLYCOSYLATED HEMOGLOBIN (HGB A1C): HEMOGLOBIN A1C: 6.7

## 2015-06-21 MED ORDER — DUTASTERIDE 0.5 MG PO CAPS: 0.5000 mg | ORAL_CAPSULE | Freq: Every day | ORAL | Status: DC

## 2015-06-21 MED ORDER — OMEPRAZOLE 20 MG PO CPDR: DELAYED_RELEASE_CAPSULE | ORAL | Status: DC

## 2015-06-21 MED ORDER — LOSARTAN POTASSIUM 50 MG PO TABS: 50.0000 mg | ORAL_TABLET | Freq: Every day | ORAL | Status: DC

## 2015-06-21 MED ORDER — METFORMIN HCL 500 MG PO TABS: ORAL_TABLET | ORAL | Status: DC

## 2015-06-21 MED ORDER — TAMSULOSIN HCL 0.4 MG PO CAPS: ORAL_CAPSULE | ORAL | Status: DC

## 2015-06-21 NOTE — Progress Notes (Signed)
   Subjective:    [name], male    DOB: 03-11-1961, 54 y.o.   MRN: [id number]  Diabetes He presents for his follow-up diabetic visit. He has type 2 diabetes mellitus. He is compliant with treatment all of the time. He is following a diabetic diet. Exercise: walks one mile every day. His breakfast blood glucose range is generally 110-130 mg/dl. He does not see a podiatrist.Eye exam is not current.   Bilateral knee pain. Started a few months ago. Takes naproxen. On osteobiflex and two aleave getting frustrated with his knees wonders if he may need to see a specialist  No major injury, mo ppasse away, some arthritis Walking a mile every morn   Results for orders placed or performed in visit on 06/21/15  POCT glycosylated hemoglobin (Hb A1C)  Result Value Ref Range   Hemoglobin A1C 6.7    No low sugar spells  Patient claims compliance with blood pressure medication. Watching his salt intake. Generally does not miss a dose. Medications good when checked elsewhere.  Notes urinating overall has improved.   Review of Systems No chest pain no headache and back pain abdominal pain no change in bowel habits    Objective:   Physical Exam Alert vital stable blood pressure good on repeat HEENT normal lungs clear heart rare rhythm knees bilateral crepitations ankles without edema       Assessment & Plan:  Impression 1 type 2 diabetes control much improved there are 2 hypertension good control discussed #3 progressive arthritis knees patient wondering if major workup warranted plan x-ray knees. Local measures discussed exercise encourage. Maintain same dose of medicine for both diabetes and blood pressure. Flu shot. Diet exercise discussed. Recheck as scheduled. WSL

## 2015-07-31 ENCOUNTER — Other Ambulatory Visit: Payer: Self-pay | Admitting: Family Medicine

## 2015-10-25 ENCOUNTER — Other Ambulatory Visit: Payer: Self-pay | Admitting: Family Medicine

## 2015-11-16 ENCOUNTER — Other Ambulatory Visit: Payer: Self-pay | Admitting: Family Medicine

## 2015-11-18 ENCOUNTER — Ambulatory Visit (INDEPENDENT_AMBULATORY_CARE_PROVIDER_SITE_OTHER): Payer: BLUE CROSS/BLUE SHIELD | Admitting: Family Medicine

## 2015-11-18 ENCOUNTER — Encounter: Payer: Self-pay | Admitting: Family Medicine

## 2015-11-18 VITALS — Temp 98.2°F | Wt 208.0 lb

## 2015-11-18 DIAGNOSIS — J019 Acute sinusitis, unspecified: Secondary | ICD-10-CM | POA: Diagnosis not present

## 2015-11-18 DIAGNOSIS — B9689 Other specified bacterial agents as the cause of diseases classified elsewhere: Secondary | ICD-10-CM

## 2015-11-18 MED ORDER — DOXYCYCLINE HYCLATE 100 MG PO CAPS: 100.0000 mg | ORAL_CAPSULE | Freq: Two times a day (BID) | ORAL | Status: DC

## 2015-11-18 NOTE — Progress Notes (Signed)
   Subjective:    [name], male    DOB: 04/13/1961, 55 y.o.   MRN: [id number]  Cough This is a new problem. Episode onset: 2 weeks ago. Associated symptoms comments: Congestion . Treatments tried: nyquil. The treatment provided mild relief.   Patient*please ago with head congestion drainage coughing then started having sinus pressure drainage discomfort in the past few days denies high fever chills   Review of Systems  Respiratory: Positive for cough.    Relates drainage coughing sinus pressure denies wheezing vomiting    Objective:   Physical Exam Mild sinus tenderness. Eardrums normal neck no masses lungs clear heart regular.       Assessment & Plan:  Viral syndrome Secondary rhinosinusitis Antibiotics prescribed Patient was seen today for upper respiratory illness. It is felt that the patient is dealing with sinusitis. Antibiotics were prescribed today. Importance of compliance with medication was discussed. Symptoms should gradually resolve over the course of the next several days. If high fevers, progressive illness, difficulty breathing, worsening condition or failure for symptoms to improve over the next several days then the patient is to follow-up. If any emergent conditions the patient is to follow-up in the emergency department otherwise to follow-up in the office.

## 2015-11-26 ENCOUNTER — Other Ambulatory Visit: Payer: Self-pay | Admitting: Family Medicine

## 2015-12-19 ENCOUNTER — Ambulatory Visit (INDEPENDENT_AMBULATORY_CARE_PROVIDER_SITE_OTHER): Payer: BLUE CROSS/BLUE SHIELD | Admitting: Family Medicine

## 2015-12-19 ENCOUNTER — Encounter: Payer: Self-pay | Admitting: Family Medicine

## 2015-12-19 VITALS — BP 122/84 | Ht 73.0 in | Wt 204.0 lb

## 2015-12-19 DIAGNOSIS — I10 Essential (primary) hypertension: Secondary | ICD-10-CM | POA: Diagnosis not present

## 2015-12-19 DIAGNOSIS — M25561 Pain in right knee: Secondary | ICD-10-CM

## 2015-12-19 DIAGNOSIS — E119 Type 2 diabetes mellitus without complications: Secondary | ICD-10-CM | POA: Diagnosis not present

## 2015-12-19 LAB — POCT GLYCOSYLATED HEMOGLOBIN (HGB A1C): Hemoglobin A1C: 6

## 2015-12-19 MED ORDER — METFORMIN HCL 500 MG PO TABS: ORAL_TABLET | ORAL | Status: DC

## 2015-12-19 MED ORDER — TAMSULOSIN HCL 0.4 MG PO CAPS: ORAL_CAPSULE | ORAL | Status: DC

## 2015-12-19 MED ORDER — LOSARTAN POTASSIUM 50 MG PO TABS: ORAL_TABLET | ORAL | Status: DC

## 2015-12-19 MED ORDER — DUTASTERIDE 0.5 MG PO CAPS: ORAL_CAPSULE | ORAL | Status: DC

## 2015-12-19 MED ORDER — FLUTICASONE PROPIONATE 50 MCG/ACT NA SUSP: NASAL | Status: DC

## 2015-12-19 MED ORDER — OMEPRAZOLE 20 MG PO CPDR: DELAYED_RELEASE_CAPSULE | ORAL | Status: DC

## 2015-12-19 NOTE — Progress Notes (Signed)
   Subjective:    [name], male    DOB: 1961/07/27, 55 y.o.   MRN: [id number]  Diabetes He presents for his follow-up diabetic visit. He has type 2 diabetes mellitus. Current diabetic treatments: metformin. He is compliant with treatment all of the time. He is following a generally healthy diet. He participates in exercise daily (walks one mile a day). Home blood sugar record trend: pt does not check blood sugar. He sees a podiatrist.Eye exam is not current.   Results for orders placed or performed in visit on 12/19/15  POCT glycosylated hemoglobin (Hb A1C)  Result Value Ref Range   Hemoglobin A1C 6.0     Right knee pain. Hurting for awhile but getting worse for the past 2 -3 weeks. Takes two aleve in the am and osteo bi-flex, xray last fall revealed arthritis, tri compartmental. On further history has some good days and not severe day still able to his usual activities. Next  Blood pressure medication review. Has not miss a dose. Compliant with medicine. Has cut down salt intake   .   Walking five times per wk   Needs all refills updated Review of Systems No headache, no major weight loss or weight gain, no chest pain no back pain abdominal pain no change in bowel habits complete ROS otherwise negative     Objective:   Physical Exam Alert vital stable HEENT normal blood pressure good on repeat lungs clear heart rare rhythm. Knee positive crepitation obvious effusion no joint line tenderness pulses good       Assessment & Plan:  Impression 1 type 2 diabetes excellent control discussed continue same therapy #2 hypertension good control continue same meds #3 knee arthritis discuss potential options patient wishes to hold off on referral for now local measures discussed exercise discussed encourage medication refill follow-up as scheduled WSL

## 2016-01-10 ENCOUNTER — Other Ambulatory Visit: Payer: Self-pay | Admitting: *Deleted

## 2016-01-10 MED ORDER — METFORMIN HCL 500 MG PO TABS: ORAL_TABLET | ORAL | Status: DC

## 2016-01-10 MED ORDER — LOSARTAN POTASSIUM 50 MG PO TABS: ORAL_TABLET | ORAL | Status: DC

## 2016-03-13 DIAGNOSIS — B351 Tinea unguium: Secondary | ICD-10-CM | POA: Diagnosis not present

## 2016-05-11 ENCOUNTER — Other Ambulatory Visit: Payer: Self-pay | Admitting: Family Medicine

## 2016-06-09 ENCOUNTER — Other Ambulatory Visit: Payer: Self-pay | Admitting: Family Medicine

## 2016-06-12 DIAGNOSIS — B351 Tinea unguium: Secondary | ICD-10-CM | POA: Diagnosis not present

## 2016-06-19 ENCOUNTER — Ambulatory Visit (INDEPENDENT_AMBULATORY_CARE_PROVIDER_SITE_OTHER): Payer: BLUE CROSS/BLUE SHIELD | Admitting: Family Medicine

## 2016-06-19 ENCOUNTER — Encounter: Payer: Self-pay | Admitting: Family Medicine

## 2016-06-19 ENCOUNTER — Other Ambulatory Visit: Payer: Self-pay | Admitting: *Deleted

## 2016-06-19 VITALS — BP 138/86 | Ht 73.0 in | Wt 203.0 lb

## 2016-06-19 DIAGNOSIS — Z79899 Other long term (current) drug therapy: Secondary | ICD-10-CM | POA: Diagnosis not present

## 2016-06-19 DIAGNOSIS — K219 Gastro-esophageal reflux disease without esophagitis: Secondary | ICD-10-CM | POA: Diagnosis not present

## 2016-06-19 DIAGNOSIS — Z13220 Encounter for screening for lipoid disorders: Secondary | ICD-10-CM | POA: Diagnosis not present

## 2016-06-19 DIAGNOSIS — I10 Essential (primary) hypertension: Secondary | ICD-10-CM | POA: Diagnosis not present

## 2016-06-19 DIAGNOSIS — E1140 Type 2 diabetes mellitus with diabetic neuropathy, unspecified: Secondary | ICD-10-CM | POA: Diagnosis not present

## 2016-06-19 DIAGNOSIS — Z125 Encounter for screening for malignant neoplasm of prostate: Secondary | ICD-10-CM

## 2016-06-19 DIAGNOSIS — E119 Type 2 diabetes mellitus without complications: Secondary | ICD-10-CM

## 2016-06-19 DIAGNOSIS — Z23 Encounter for immunization: Secondary | ICD-10-CM | POA: Diagnosis not present

## 2016-06-19 LAB — POCT GLYCOSYLATED HEMOGLOBIN (HGB A1C): Hemoglobin A1C: 6

## 2016-06-19 MED ORDER — OMEPRAZOLE 20 MG PO CPDR: DELAYED_RELEASE_CAPSULE | ORAL | 5 refills | Status: DC

## 2016-06-19 MED ORDER — DUTASTERIDE 0.5 MG PO CAPS: ORAL_CAPSULE | ORAL | 5 refills | Status: DC

## 2016-06-19 MED ORDER — TAMSULOSIN HCL 0.4 MG PO CAPS: ORAL_CAPSULE | ORAL | 5 refills | Status: DC

## 2016-06-19 MED ORDER — LOSARTAN POTASSIUM 50 MG PO TABS: ORAL_TABLET | ORAL | 5 refills | Status: DC

## 2016-06-19 MED ORDER — FLUTICASONE PROPIONATE 50 MCG/ACT NA SUSP: NASAL | 5 refills | Status: DC

## 2016-06-19 MED ORDER — METFORMIN HCL 500 MG PO TABS: ORAL_TABLET | ORAL | 5 refills | Status: DC

## 2016-06-19 NOTE — Progress Notes (Signed)
   Subjective:    [name], male    DOB: [birth year], 55 y.o.   MRN: [id number]  Diabetes  He presents for his follow-up diabetic visit. He has type 2 diabetes mellitus. Current diabetic treatments: metformin. He is compliant with treatment all of the time. Exercise: walks one mile every day. Home blood sugar record trend: pt does not check blood sugar. He sees a podiatrist.Eye exam is not current.   A1C 6.0   Pt states no concerns today.   Urin a lot better,  Urin overall a lot better, rarelyu still a problem    now acceptsflu vaccine.   BP s god when cked elsewhere Blood pressure medicine and blood pressure levels reviewed today with patient. Compliant with blood pressure medicine. States does not miss a dose. No obvious side effects. Blood pressure generally good when checked elsewhere. Watching salt intake.  Patient notes numbness and tingling. Slightly discomforting at times. On sensation. Both feet. Distally.   Review of Systems No headache, no major weight loss or weight gain, no chest pain no back pain abdominal pain no change in bowel habits complete ROS otherwise negative     Objective:   Physical Exam  Alert vitals stable, NAD. Blood pressure good on repeat. HEENT normal. Lungs clear. Heart regular rate and rhythm.  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 Plan maintain same medications. Flu shot. Diet exercise discussed. Blood work discussed WSL and ordered

## 2016-06-19 NOTE — Progress Notes (Signed)
   Subjective:    [name], male    DOB: October 15, 1960, 55 y.o.   MRN: [id number]  HPI    Review of Systems     Objective:   Physical Exam        Assessment & Plan:

## 2016-07-24 DIAGNOSIS — E119 Type 2 diabetes mellitus without complications: Secondary | ICD-10-CM | POA: Diagnosis not present

## 2016-07-24 DIAGNOSIS — Z79899 Other long term (current) drug therapy: Secondary | ICD-10-CM | POA: Diagnosis not present

## 2016-07-24 DIAGNOSIS — I10 Essential (primary) hypertension: Secondary | ICD-10-CM | POA: Diagnosis not present

## 2016-07-24 DIAGNOSIS — Z13220 Encounter for screening for lipoid disorders: Secondary | ICD-10-CM | POA: Diagnosis not present

## 2016-07-25 LAB — HEPATIC FUNCTION PANEL
ALT: 29 IU/L (ref 0–44)
AST: 24 IU/L (ref 0–40)
Albumin: 4 g/dL (ref 3.5–5.5)
Alkaline Phosphatase: 76 IU/L (ref 39–117)
BILIRUBIN, DIRECT: 0.16 mg/dL (ref 0.00–0.40)
Bilirubin Total: 0.6 mg/dL (ref 0.0–1.2)
TOTAL PROTEIN: 6.6 g/dL (ref 6.0–8.5)

## 2016-07-25 LAB — MICROALBUMIN / CREATININE URINE RATIO: CREATININE, UR: 135.2 mg/dL

## 2016-07-25 LAB — BASIC METABOLIC PANEL
BUN/Creatinine Ratio: 15 (ref 9–20)
BUN: 14 mg/dL (ref 6–24)
CO2: 25 mmol/L (ref 18–29)
Calcium: 9.4 mg/dL (ref 8.7–10.2)
Chloride: 101 mmol/L (ref 96–106)
Creatinine, Ser: 0.94 mg/dL (ref 0.76–1.27)
GFR calc Af Amer: 104 mL/min/{1.73_m2} (ref >59)
GFR calc non Af Amer: 90 mL/min/{1.73_m2} (ref >59)
GLUCOSE: 123 mg/dL — AB (ref 65–99)
Potassium: 4.3 mmol/L (ref 3.5–5.2)
Sodium: 143 mmol/L (ref 134–144)

## 2016-07-25 LAB — LIPID PANEL
CHOL/HDL RATIO: 4.5 ratio (ref 0.0–5.0)
Cholesterol, Total: 168 mg/dL (ref 100–199)
HDL: 37 mg/dL — AB (ref >39)
LDL Calculated: 102 mg/dL — ABNORMAL HIGH (ref 0–99)
Triglycerides: 143 mg/dL (ref 0–149)
VLDL CHOLESTEROL CAL: 29 mg/dL (ref 5–40)

## 2016-07-25 LAB — PSA: PROSTATE SPECIFIC AG, SERUM: 0.4 ng/mL (ref 0.0–4.0)

## 2016-08-03 ENCOUNTER — Ambulatory Visit (INDEPENDENT_AMBULATORY_CARE_PROVIDER_SITE_OTHER): Payer: BLUE CROSS/BLUE SHIELD | Admitting: Family Medicine

## 2016-08-03 ENCOUNTER — Encounter: Payer: Self-pay | Admitting: Family Medicine

## 2016-08-03 VITALS — BP 118/78 | Ht 73.0 in | Wt 206.8 lb

## 2016-08-03 DIAGNOSIS — Q828 Other specified congenital malformations of skin: Secondary | ICD-10-CM

## 2016-08-03 NOTE — Patient Instructions (Signed)
Results for orders placed or performed in visit on 06/19/16  Lipid panel  Result Value Ref Range   Cholesterol, Total 168 100 - 199 mg/dL   Triglycerides 540143 0 - 149 mg/dL   HDL 37 (L) >98>39 mg/dL   VLDL Cholesterol Cal 29 5 - 40 mg/dL   LDL Calculated 119102 (H) 0 - 99 mg/dL   Chol/HDL Ratio 4.5 0.0 - 5.0 ratio units  Hepatic function panel  Result Value Ref Range   Total Protein 6.6 6.0 - 8.5 g/dL   Albumin 4.0 3.5 - 5.5 g/dL   Bilirubin Total 0.6 0.0 - 1.2 mg/dL   Bilirubin, Direct 1.470.16 0.00 - 0.40 mg/dL   Alkaline Phosphatase 76 39 - 117 IU/L   AST 24 0 - 40 IU/L   ALT 29 0 - 44 IU/L  Basic metabolic panel  Result Value Ref Range   Glucose 123 (H) 65 - 99 mg/dL   BUN 14 6 - 24 mg/dL   Creatinine, Ser 8.290.94 0.76 - 1.27 mg/dL   GFR calc non Af Amer 90 >59 mL/min/1.73   GFR calc Af Amer 104 >59 mL/min/1.73   BUN/Creatinine Ratio 15 9 - 20   Sodium 143 134 - 144 mmol/L   Potassium 4.3 3.5 - 5.2 mmol/L   Chloride 101 96 - 106 mmol/L   CO2 25 18 - 29 mmol/L   Calcium 9.4 8.7 - 10.2 mg/dL  Microalbumin / creatinine urine ratio  Result Value Ref Range   Creatinine, Urine 135.2 Not Estab. mg/dL   Microalbum.,U,Random <3.0 Not Estab. ug/mL   Microalb/Creat Ratio <2.2 0.0 - 30.0 mg/g creat  PSA  Result Value Ref Range   Prostate Specific Ag, Serum 0.4 0.0 - 4.0 ng/mL  POCT glycosylated hemoglobin (Hb A1C)  Result Value Ref Range   Hemoglobin A1C 6.0

## 2016-08-03 NOTE — Progress Notes (Signed)
   Subjective:    [name], male    DOB: 1961-04-02, 55 y.o.   MRN: [id number]  HPI  Patient arrives for skin tag removal. Patient has skin tags under both arms. Also would like to discuss resent lab work results.  Review of Systems No headache, no major weight loss or weight gain, no chest pain no back pain abdominal pain no change in bowel habits complete ROS otherwise negative     Objective:   Physical Exam  Alert vitals stable, NAD. Blood pressure good on repeat. HEENT normal. Lungs clear. Heart regular rate and rhythm. Substantial tags present in both axilla and along the edge of the neck  Patient was prepped draped and under sterile conditions multiple skin tags removed with iris scissors. Hemostasis achieved. Wound care discussed      Assessment & Plan:

## 2016-09-11 DIAGNOSIS — B351 Tinea unguium: Secondary | ICD-10-CM | POA: Diagnosis not present

## 2016-09-22 ENCOUNTER — Other Ambulatory Visit: Payer: Self-pay | Admitting: Family Medicine

## 2016-12-16 ENCOUNTER — Encounter: Payer: Self-pay | Admitting: Family Medicine

## 2016-12-16 ENCOUNTER — Ambulatory Visit (INDEPENDENT_AMBULATORY_CARE_PROVIDER_SITE_OTHER): Payer: BLUE CROSS/BLUE SHIELD | Admitting: Family Medicine

## 2016-12-16 VITALS — BP 114/78 | Ht 73.0 in | Wt 209.4 lb

## 2016-12-16 DIAGNOSIS — G8929 Other chronic pain: Secondary | ICD-10-CM

## 2016-12-16 DIAGNOSIS — M25561 Pain in right knee: Secondary | ICD-10-CM

## 2016-12-16 DIAGNOSIS — N529 Male erectile dysfunction, unspecified: Secondary | ICD-10-CM | POA: Insufficient documentation

## 2016-12-16 DIAGNOSIS — E118 Type 2 diabetes mellitus with unspecified complications: Secondary | ICD-10-CM

## 2016-12-16 DIAGNOSIS — I10 Essential (primary) hypertension: Secondary | ICD-10-CM

## 2016-12-16 DIAGNOSIS — N5201 Erectile dysfunction due to arterial insufficiency: Secondary | ICD-10-CM

## 2016-12-16 LAB — POCT GLYCOSYLATED HEMOGLOBIN (HGB A1C): HEMOGLOBIN A1C: 6.7

## 2016-12-16 MED ORDER — SILDENAFIL CITRATE 20 MG PO TABS: ORAL_TABLET | ORAL | 0 refills | Status: DC

## 2016-12-16 MED ORDER — OMEPRAZOLE 20 MG PO CPDR: DELAYED_RELEASE_CAPSULE | ORAL | 5 refills | Status: DC

## 2016-12-16 MED ORDER — FLUTICASONE PROPIONATE 50 MCG/ACT NA SUSP: NASAL | 5 refills | Status: DC

## 2016-12-16 MED ORDER — TAMSULOSIN HCL 0.4 MG PO CAPS: ORAL_CAPSULE | ORAL | 5 refills | Status: DC

## 2016-12-16 MED ORDER — DUTASTERIDE 0.5 MG PO CAPS: ORAL_CAPSULE | ORAL | 5 refills | Status: DC

## 2016-12-16 MED ORDER — METFORMIN HCL 500 MG PO TABS: ORAL_TABLET | ORAL | 5 refills | Status: DC

## 2016-12-16 MED ORDER — LOSARTAN POTASSIUM 50 MG PO TABS: ORAL_TABLET | ORAL | 5 refills | Status: DC

## 2016-12-16 NOTE — Progress Notes (Signed)
   Subjective:    [name], male    DOB: Feb 18, 1961, 56 y.o.   MRN: [id number]  Diabetes  He presents for his follow-up diabetic visit. He has type 2 diabetes mellitus. He has not had a previous visit with a dietitian. He sees a podiatrist.Eye exam is not current.   Patient claims compliance with diabetes medication. No obvious side effects. Reports no substantial low sugar spells. Most numbers are generally in good range when checked fasting. Generally does not miss a dose of medication. Watching diabetic diet closely  Blood pressure medicine and blood pressure levels reviewed today with patient. Compliant with blood pressure medicine. States does not miss a dose. No obvious side effects. Blood pressure generally good when checked elsewhere. Watching salt intake.  Watching diet fairly close,  Right knee, took otc meds, Worsening pain, known hx of rthritis, popps and cracks and getting very tight and uncomfortable    Patient would also like a referral to see orthopedist for knee pain.   Results for orders placed or performed in visit on 12/16/16  POCT HgB A1C  Result Value Ref Range   Hemoglobin A1C 6.7      Review of Systems No headache, no major weight loss or weight gain, no chest pain no back pain abdominal pain no change in bowel habits complete ROS otherwise negative     Objective:   Physical Exam  Alert and oriented, vitals reviewed and stable, NAD ENT-TM's and ext canals WNL bilat via otoscopic exam Soft palate, tonsils and post pharynx WNL via oropharyngeal exam Neck-symmetric, no masses; thyroid nonpalpable and nontender Pulmonary-no tachypnea or accessory muscle use; Clear without wheezes via auscultation Card--no abnrml murmurs, rhythm reg and rate WNL Carotid pulses symmetric, without bruits Positive crepitations both knees with extension and flexion no joint laxity slight effusion at most      Assessment & Plan:  Impression 1 type 2  diabetes discussed good control maintain same meds #2 hypertension controlled bit tight though overall preferable discussed maintain same #3 progressive arthritis knees. On further discussion patient wishes to hold off on orthopedic referral #4 erectile dysfunction. Ongoing challenge. Worse recently. Patient like medications meds discussed pros and cons of various meds discussed. Generic Viagra prescribed. Other medications prescribed diet exercise discussed recheck in 6 months

## 2016-12-18 DIAGNOSIS — B351 Tinea unguium: Secondary | ICD-10-CM | POA: Diagnosis not present

## 2017-02-09 ENCOUNTER — Encounter: Payer: Self-pay | Admitting: Family Medicine

## 2017-02-09 ENCOUNTER — Ambulatory Visit (INDEPENDENT_AMBULATORY_CARE_PROVIDER_SITE_OTHER): Payer: BLUE CROSS/BLUE SHIELD | Admitting: Family Medicine

## 2017-02-09 VITALS — BP 110/74 | Ht 73.0 in | Wt 212.4 lb

## 2017-02-09 DIAGNOSIS — H00014 Hordeolum externum left upper eyelid: Secondary | ICD-10-CM | POA: Diagnosis not present

## 2017-02-09 MED ORDER — GENTAMICIN SULFATE 0.3 % OP SOLN: 1.0000 [drp] | Freq: Four times a day (QID) | OPHTHALMIC | 0 refills | Status: AC

## 2017-02-09 NOTE — Progress Notes (Signed)
   Subjective:    [name] W Geeslin, male    DOB: 02-10-1961, [age] y.o.   MRN: [id number]  Eye Problem   The left eye is affected. This is a new problem. The current episode started in the past 7 days. There was no injury mechanism.   Patient states no other concerns this visit.   Review of Systems No headache, no major weight loss or weight gain, no chest pain no back pain abdominal pain no change in bowel habits complete ROS otherwise negative     Objective:   Physical Exam  Alert vitals stable, NAD. Blood pressure good on repeat. HEENT normal. Lungs clear. Heart regular rate and rhythm. Left eye upper eyelid stye evident with slight erythema tenderness and positive postural      Assessment & Plan:  Impression stye antibiotic drops prescribed symptom care discussed

## 2017-03-19 DIAGNOSIS — B351 Tinea unguium: Secondary | ICD-10-CM | POA: Diagnosis not present

## 2017-03-22 LAB — HM DIABETES EYE EXAM

## 2017-04-06 ENCOUNTER — Encounter: Payer: Self-pay | Admitting: *Deleted

## 2017-06-11 DIAGNOSIS — B351 Tinea unguium: Secondary | ICD-10-CM | POA: Diagnosis not present

## 2017-06-17 ENCOUNTER — Ambulatory Visit (INDEPENDENT_AMBULATORY_CARE_PROVIDER_SITE_OTHER): Payer: BLUE CROSS/BLUE SHIELD | Admitting: Family Medicine

## 2017-06-17 ENCOUNTER — Encounter: Payer: Self-pay | Admitting: Family Medicine

## 2017-06-17 VITALS — BP 116/78 | Ht 73.0 in | Wt 215.0 lb

## 2017-06-17 DIAGNOSIS — Z23 Encounter for immunization: Secondary | ICD-10-CM

## 2017-06-17 DIAGNOSIS — Z125 Encounter for screening for malignant neoplasm of prostate: Secondary | ICD-10-CM

## 2017-06-17 DIAGNOSIS — I10 Essential (primary) hypertension: Secondary | ICD-10-CM | POA: Diagnosis not present

## 2017-06-17 DIAGNOSIS — E119 Type 2 diabetes mellitus without complications: Secondary | ICD-10-CM | POA: Diagnosis not present

## 2017-06-17 DIAGNOSIS — Z13220 Encounter for screening for lipoid disorders: Secondary | ICD-10-CM | POA: Diagnosis not present

## 2017-06-17 DIAGNOSIS — Z79899 Other long term (current) drug therapy: Secondary | ICD-10-CM | POA: Diagnosis not present

## 2017-06-17 LAB — POCT GLYCOSYLATED HEMOGLOBIN (HGB A1C): HEMOGLOBIN A1C: 7.9

## 2017-06-17 MED ORDER — OMEPRAZOLE 20 MG PO CPDR: DELAYED_RELEASE_CAPSULE | ORAL | 5 refills | Status: DC

## 2017-06-17 MED ORDER — DUTASTERIDE 0.5 MG PO CAPS: ORAL_CAPSULE | ORAL | 5 refills | Status: DC

## 2017-06-17 MED ORDER — FLUTICASONE PROPIONATE 50 MCG/ACT NA SUSP: NASAL | 5 refills | Status: DC

## 2017-06-17 MED ORDER — TAMSULOSIN HCL 0.4 MG PO CAPS: ORAL_CAPSULE | ORAL | 5 refills | Status: DC

## 2017-06-17 MED ORDER — LOSARTAN POTASSIUM 50 MG PO TABS: ORAL_TABLET | ORAL | 5 refills | Status: DC

## 2017-06-17 MED ORDER — SILDENAFIL CITRATE 20 MG PO TABS: ORAL_TABLET | ORAL | 5 refills | Status: DC

## 2017-06-17 MED ORDER — METFORMIN HCL 500 MG PO TABS: ORAL_TABLET | ORAL | 5 refills | Status: DC

## 2017-06-17 NOTE — Progress Notes (Signed)
   Subjective:    [name], male    DOB: September 19, 1961, 56 y.o.   MRN: [id number]  Diabetes  He presents for his follow-up diabetic visit. He has type 2 diabetes mellitus. He is compliant with treatment all of the time. Diabetic current diet: tries to eat healthy. Exercise: walks one mile every day. Home blood sugar record trend: does not check blood sugar. He sees a podiatrist.Eye exam is current (july 2018).   Results for orders placed or performed in visit on 06/17/17  POCT glycosylated hemoglobin (Hb A1C)  Result Value Ref Range   Hemoglobin A1C 7.9    would like several spots on his skin to be assessed  Flu vaccine.   Patient claims compliance with diabetes medication. No obvious side effects. Reports no substantial low sugar spells. Most numbers are generally in good range when checked fasting. Generally does not miss a dose of medication. Watching diabetic diet closely  Blood pressure medicine and blood pressure levels reviewed today with patient. Compliant with blood pressure medicine. States does not miss a dose. No obvious side effects. Blood pressure generally good when checked elsewhere. Watching salt intake.   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   Flu shot today       Review of Systems No headache, no major weight loss or weight gain, no chest pain no back pain abdominal pain no change in bowel habits complete ROS otherwise negative     Objective:   Physical Exam Alert and oriented, vitals reviewed and stable, NAD ENT-TM's and ext canals WNL bilat via otoscopic exam Soft palate, tonsils and post pharynx WNL via oropharyngeal exam Neck-symmetric, no masses; thyroid nonpalpable and nontender Pulmonary-no tachypnea or accessory muscle use; Clear without wheezes via auscultation Card--no abnrml murmurs, rhythm reg and rate WNL Carotid pulses symmetric, without bruits 4 head reveals skin tag left superior forehead. Right superior  forehead reveals seborrheic keratosis/arms revealed bilateral seborrheic keratosis       Assessment & Plan:  Impression 1 diabetes suboptimum consistent diet and exercise not the best. Long discussion held. Will add glipizide. Rationale discussed. Importance of not skipping meals discussed.  #2essential hypertension. Discussed control good. To maintain same  #3 skin lesions. Benign in appearance. If patient would like for [place] remove skin tag we can, if he wants seborrheic keratoses worked on with need to go to dermatologist discussed  Follow-up in 6 months wellness plus chronic/flu shot today/appropriate blood work further recommendations based

## 2017-06-18 ENCOUNTER — Ambulatory Visit: Payer: BLUE CROSS/BLUE SHIELD | Admitting: Family Medicine

## 2017-06-21 ENCOUNTER — Telehealth: Payer: Self-pay | Admitting: Family Medicine

## 2017-06-21 ENCOUNTER — Other Ambulatory Visit: Payer: Self-pay | Admitting: *Deleted

## 2017-06-21 MED ORDER — GLIPIZIDE 5 MG PO TABS: 5.0000 mg | ORAL_TABLET | ORAL | 5 refills | Status: DC

## 2017-06-21 NOTE — Telephone Encounter (Signed)
Med sent to pharm. Pt notified.  

## 2017-06-21 NOTE — Telephone Encounter (Signed)
Note states to add glipizide but no dose or directions. Please advise

## 2017-06-21 NOTE — Telephone Encounter (Signed)
Patient seen Dr. Brett Canales on 06/17/17 for his diabetes.  His spouse called and said that there was supposed to be a new medication called in, but it was not delivered.  Please advise.  [facility]

## 2017-06-21 NOTE — Telephone Encounter (Signed)
Glipizide five mg one p o qam. Six mo worth

## 2017-06-23 DIAGNOSIS — Z79899 Other long term (current) drug therapy: Secondary | ICD-10-CM | POA: Diagnosis not present

## 2017-06-23 DIAGNOSIS — I10 Essential (primary) hypertension: Secondary | ICD-10-CM | POA: Diagnosis not present

## 2017-06-23 DIAGNOSIS — E119 Type 2 diabetes mellitus without complications: Secondary | ICD-10-CM | POA: Diagnosis not present

## 2017-06-23 DIAGNOSIS — Z13220 Encounter for screening for lipoid disorders: Secondary | ICD-10-CM | POA: Diagnosis not present

## 2017-06-23 DIAGNOSIS — Z125 Encounter for screening for malignant neoplasm of prostate: Secondary | ICD-10-CM | POA: Diagnosis not present

## 2017-06-24 LAB — BASIC METABOLIC PANEL
BUN/Creatinine Ratio: 21 — ABNORMAL HIGH (ref 9–20)
BUN: 18 mg/dL (ref 6–24)
CALCIUM: 9.9 mg/dL (ref 8.7–10.2)
CO2: 23 mmol/L (ref 20–29)
CREATININE: 0.87 mg/dL (ref 0.76–1.27)
Chloride: 102 mmol/L (ref 96–106)
GFR calc non Af Amer: 96 mL/min/{1.73_m2} (ref >59)
GFR, EST AFRICAN AMERICAN: 111 mL/min/{1.73_m2} (ref >59)
Glucose: 142 mg/dL — ABNORMAL HIGH (ref 65–99)
Potassium: 4.6 mmol/L (ref 3.5–5.2)
Sodium: 139 mmol/L (ref 134–144)

## 2017-06-24 LAB — HEPATIC FUNCTION PANEL
ALT: 42 IU/L (ref 0–44)
AST: 34 IU/L (ref 0–40)
Albumin: 4 g/dL (ref 3.5–5.5)
Alkaline Phosphatase: 64 IU/L (ref 39–117)
BILIRUBIN TOTAL: 0.7 mg/dL (ref 0.0–1.2)
Bilirubin, Direct: 0.18 mg/dL (ref 0.00–0.40)
Total Protein: 6.5 g/dL (ref 6.0–8.5)

## 2017-06-24 LAB — LIPID PANEL
CHOL/HDL RATIO: 4.9 ratio (ref 0.0–5.0)
CHOLESTEROL TOTAL: 165 mg/dL (ref 100–199)
HDL: 34 mg/dL — ABNORMAL LOW (ref >39)
LDL CALC: 111 mg/dL — AB (ref 0–99)
Triglycerides: 98 mg/dL (ref 0–149)
VLDL Cholesterol Cal: 20 mg/dL (ref 5–40)

## 2017-06-24 LAB — MICROALBUMIN / CREATININE URINE RATIO: CREATININE, UR: 102.8 mg/dL

## 2017-06-24 LAB — PSA: Prostate Specific Ag, Serum: 0.4 ng/mL (ref 0.0–4.0)

## 2017-07-11 ENCOUNTER — Encounter: Payer: Self-pay | Admitting: Family Medicine

## 2017-07-26 ENCOUNTER — Encounter: Payer: Self-pay | Admitting: Family Medicine

## 2017-07-26 ENCOUNTER — Ambulatory Visit (INDEPENDENT_AMBULATORY_CARE_PROVIDER_SITE_OTHER): Payer: BLUE CROSS/BLUE SHIELD | Admitting: Family Medicine

## 2017-07-26 ENCOUNTER — Other Ambulatory Visit: Payer: Self-pay

## 2017-07-26 VITALS — BP 96/68 | Temp 97.7°F | Ht 73.0 in | Wt 216.5 lb

## 2017-07-26 DIAGNOSIS — J209 Acute bronchitis, unspecified: Secondary | ICD-10-CM

## 2017-07-26 DIAGNOSIS — J329 Chronic sinusitis, unspecified: Secondary | ICD-10-CM

## 2017-07-26 DIAGNOSIS — J208 Acute bronchitis due to other specified organisms: Secondary | ICD-10-CM

## 2017-07-26 MED ORDER — AMOXICILLIN-POT CLAVULANATE 875-125 MG PO TABS: 1.0000 | ORAL_TABLET | Freq: Two times a day (BID) | ORAL | 0 refills | Status: DC

## 2017-07-26 MED ORDER — HYDROCODONE-HOMATROPINE 5-1.5 MG/5ML PO SYRP: ORAL_SOLUTION | ORAL | 0 refills | Status: DC

## 2017-07-26 NOTE — Progress Notes (Signed)
   Subjective:    Patient ID: Rozanna Boxandy W Game, male    DOB: 1961-02-28, 56 y.o.   MRN: [id number]  Cough  This is a new problem. The current episode started in the past 7 days. Associated symptoms include rhinorrhea.   Cough and cong and dranage  Frontal jeaache with disch and prod cough  h a sharp at times dull at tines  Bad cough   Used equate and took otc azithromycin     [place] and [place] to him then Review of Systems  HENT: Positive for rhinorrhea.   Respiratory: Positive for cough.        Objective:   Physical Exam Alert, mild malaise. Hydration good Vitals stable. frontal/ maxillary tenderness evident positive nasal congestion. pharynx normal neck supple  lungs clear/no crackles or wheezes. heart regular in rhythm        Assessment & Plan:  Impression rhinosinusitis/bronchitis likely post viral, discussed with patient. plan antibiotics prescribed. Questions answered. Symptomatic care discussed. warning signs discussed. WSL

## 2017-08-20 DIAGNOSIS — B351 Tinea unguium: Secondary | ICD-10-CM | POA: Diagnosis not present

## 2017-09-07 ENCOUNTER — Encounter: Payer: Self-pay | Admitting: Family Medicine

## 2017-09-07 ENCOUNTER — Ambulatory Visit: Payer: BLUE CROSS/BLUE SHIELD | Admitting: Family Medicine

## 2017-09-07 ENCOUNTER — Ambulatory Visit (HOSPITAL_COMMUNITY)
Admission: RE | Admit: 2017-09-07 | Discharge: 2017-09-07 | Disposition: A | Discharge status: Home / Self Care | Payer: BLUE CROSS/BLUE SHIELD | Source: Ambulatory Visit | Attending: Family Medicine | Admitting: Family Medicine

## 2017-09-07 VITALS — BP 132/86 | Ht 73.0 in | Wt 218.0 lb

## 2017-09-07 DIAGNOSIS — M25562 Pain in left knee: Secondary | ICD-10-CM | POA: Diagnosis not present

## 2017-09-07 DIAGNOSIS — M179 Osteoarthritis of knee, unspecified: Secondary | ICD-10-CM | POA: Diagnosis not present

## 2017-09-07 DIAGNOSIS — M1712 Unilateral primary osteoarthritis, left knee: Secondary | ICD-10-CM | POA: Insufficient documentation

## 2017-09-07 DIAGNOSIS — I70202 Unspecified atherosclerosis of native arteries of extremities, left leg: Secondary | ICD-10-CM | POA: Insufficient documentation

## 2017-09-07 MED ORDER — NABUMETONE 750 MG PO TABS: 750.0000 mg | ORAL_TABLET | Freq: Every day | ORAL | 0 refills | Status: DC

## 2017-09-07 NOTE — Progress Notes (Signed)
   Subjective:    [name], male    DOB: 06-Sep-1961, 56 y.o.   MRN: [id number]  Knee Pain   The incident occurred more than 1 week ago. The pain is present in the left knee. He has tried NSAIDs for the symptoms.   Patient notes progressive pain.  Both knees.  Left more so than right.  Recalls no sudden injury.  Pain is been literally worsening for years.  Sharp aching.  Worse in the morning.  Worse after sitting for a while.  No major recent injury patient became progressively frustrated   Review of Systems No headache, no major weight loss or weight gain, no chest pain no back pain abdominal pain no change in bowel habits complete ROS otherwise negative     Objective:   Physical Exam  Alert vitals stable, NAD. Blood pressure good on repeat. HEENT normal. Lungs clear. Heart regular rate and rhythm.   Left knee effusion present impressive crepitation.  No obvious joint laxity  Sent for x-rays x-ray positive for Cartilage loss and substantial arthritis changes  Impression progressive knee arthritis.  Anti-inflammatory medicine prescribed.  Recommend orthopedic referral rationale discussed.      Assessment & Plan:

## 2017-09-20 ENCOUNTER — Encounter: Payer: Self-pay | Admitting: Family Medicine

## 2017-11-04 DIAGNOSIS — M1712 Unilateral primary osteoarthritis, left knee: Secondary | ICD-10-CM | POA: Diagnosis not present

## 2017-11-12 DIAGNOSIS — B351 Tinea unguium: Secondary | ICD-10-CM | POA: Diagnosis not present

## 2017-12-15 ENCOUNTER — Ambulatory Visit: Payer: BLUE CROSS/BLUE SHIELD | Admitting: Family Medicine

## 2017-12-15 ENCOUNTER — Encounter: Payer: Self-pay | Admitting: Family Medicine

## 2017-12-15 VITALS — BP 120/78 | Ht 73.0 in | Wt 214.2 lb

## 2017-12-15 DIAGNOSIS — Z0001 Encounter for general adult medical examination with abnormal findings: Secondary | ICD-10-CM

## 2017-12-15 DIAGNOSIS — I10 Essential (primary) hypertension: Secondary | ICD-10-CM | POA: Diagnosis not present

## 2017-12-15 DIAGNOSIS — Z0000 Encounter for general adult medical examination without abnormal findings: Secondary | ICD-10-CM

## 2017-12-15 DIAGNOSIS — E1140 Type 2 diabetes mellitus with diabetic neuropathy, unspecified: Secondary | ICD-10-CM | POA: Diagnosis not present

## 2017-12-15 DIAGNOSIS — E119 Type 2 diabetes mellitus without complications: Secondary | ICD-10-CM

## 2017-12-15 LAB — POCT GLYCOSYLATED HEMOGLOBIN (HGB A1C): Hemoglobin A1C: 6

## 2017-12-15 MED ORDER — DUTASTERIDE 0.5 MG PO CAPS: ORAL_CAPSULE | ORAL | 5 refills | Status: DC

## 2017-12-15 MED ORDER — METFORMIN HCL 500 MG PO TABS: ORAL_TABLET | ORAL | 5 refills | Status: DC

## 2017-12-15 MED ORDER — ATORVASTATIN CALCIUM 20 MG PO TABS: 20.0000 mg | ORAL_TABLET | Freq: Every day | ORAL | 5 refills | Status: DC

## 2017-12-15 MED ORDER — LOSARTAN POTASSIUM 50 MG PO TABS: ORAL_TABLET | ORAL | 5 refills | Status: DC

## 2017-12-15 MED ORDER — GLIPIZIDE 5 MG PO TABS: 5.0000 mg | ORAL_TABLET | ORAL | 5 refills | Status: DC

## 2017-12-15 MED ORDER — FLUTICASONE PROPIONATE 50 MCG/ACT NA SUSP: NASAL | 5 refills | Status: DC

## 2017-12-15 MED ORDER — OMEPRAZOLE 20 MG PO CPDR: DELAYED_RELEASE_CAPSULE | ORAL | 5 refills | Status: DC

## 2017-12-15 NOTE — Progress Notes (Signed)
Subjective:    [name], male    DOB: June 29, 1961, 57 y.o.   MRN: [id number]  HPI The patient comes in today for a wellness visit.    A review of their health history was completed.  A review of medications was also completed.  Any needed refills; yes  Eating habits: eats good  Falls/  MVA accidents in past few months: no  Regular exercise: walking  Specialist pt sees on regular basis: foot doctor  Preventative health issues were discussed.   Additional concerns: iron was low when he went to give blood- would like labwork. Normally gives blood several times per year   Patient claims compliance with diabetes medication. No obvious side effects. Reports no substantial low sugar spells. Most numbers are generally in good range when checked fasting. Generally does not miss a dose of medication. Watching diabetic diet closely  Blood pressure medicine and blood pressure levels reviewed today with patient. Compliant with blood pressure medicine. States does not miss a dose. No obvious side effects. Blood pressure generally good when checked elsewhere. Watching salt intake.    exercise  Every day walks  Last colon 2016  Eats good diet     No low sugar spells     Results for orders placed or performed in visit on 12/15/17  POCT glycosylated hemoglobin (Hb A1C)  Result Value Ref Range   Hemoglobin A1C 6.0      Review of Systems  Constitutional: Negative for activity change, appetite change and fever.  HENT: Negative for congestion and rhinorrhea.   Eyes: Negative for discharge.  Respiratory: Negative for cough and wheezing.   Cardiovascular: Negative for chest pain.  Gastrointestinal: Negative for abdominal pain, blood in stool and vomiting.  Genitourinary: Negative for difficulty urinating and frequency.  Musculoskeletal: Negative for neck pain.  Skin: Negative for rash.  Allergic/Immunologic: Negative for environmental allergies and food  allergies.  Neurological: Negative for weakness and headaches.  Psychiatric/Behavioral: Negative for agitation.  All other systems reviewed and are negative.      Objective:   Physical Exam  Constitutional: He appears well-developed and well-nourished.  HENT:  Head: Normocephalic and atraumatic.  Right Ear: External ear normal.  Left Ear: External ear normal.  Nose: Nose normal.  Mouth/Throat: Oropharynx is clear and moist.  Eyes: Right eye exhibits no discharge. Left eye exhibits no discharge. No scleral icterus.  Neck: Normal range of motion. Neck supple. No thyromegaly present.  Cardiovascular: Normal rate, regular rhythm and normal heart sounds.  No murmur heard. Pulmonary/Chest: Effort normal and breath sounds normal. No respiratory distress. He has no wheezes.  Abdominal: Soft. Bowel sounds are normal. He exhibits no distension and no mass. There is no tenderness.  Genitourinary: Penis normal.  Musculoskeletal: Normal range of motion. He exhibits no edema.  Lymphadenopathy:    He has no cervical adenopathy.  Neurological: He is alert. He exhibits normal muscle tone. Coordination normal.  Skin: Skin is warm and dry. No erythema.  Psychiatric: He has a normal mood and affect. His behavior is normal. Judgment normal.  Vitals reviewed.         Assessment & Plan:   Impression wellness exam.  Diet discussed.  Exercise discussed.  Up-to-date on colonoscopy.  Up-to-date on eye exam.  Vaccines discussed  2 type 2 diabetes good control maintain same approach  3.  Hypertension good control maintain same meds compliance discussed  For.  Recommendations regarding statins discussed and initially  Patient reports borderline  low iron on while donating checks recommend starting daily iron tablet

## 2017-12-15 NOTE — Patient Instructions (Signed)
Iron sulfate tablet one each morning   Ask your pharmacist

## 2018-01-02 ENCOUNTER — Other Ambulatory Visit: Payer: Self-pay | Admitting: Family Medicine

## 2018-01-17 IMAGING — DX DG KNEE COMPLETE 4+V*L*
4 series · 4 of 4 positions shown · non-contrast
Comparison: None.

CLINICAL DATA: Left knee pain. No known injury. Stiffness in the
morning.

EXAM:
LEFT KNEE - COMPLETE 4+ VIEW

[knee ap]
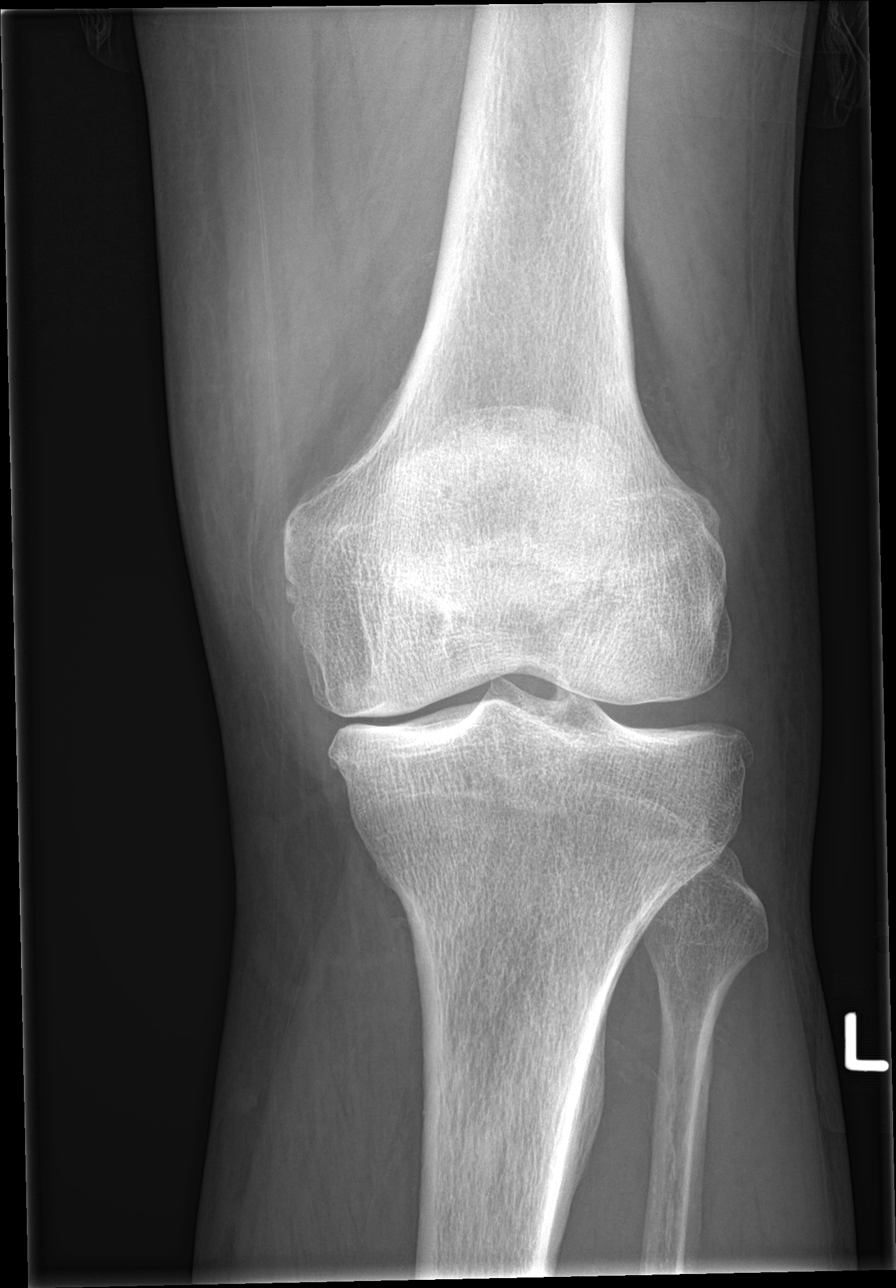

[knee obl (1 of 2)]
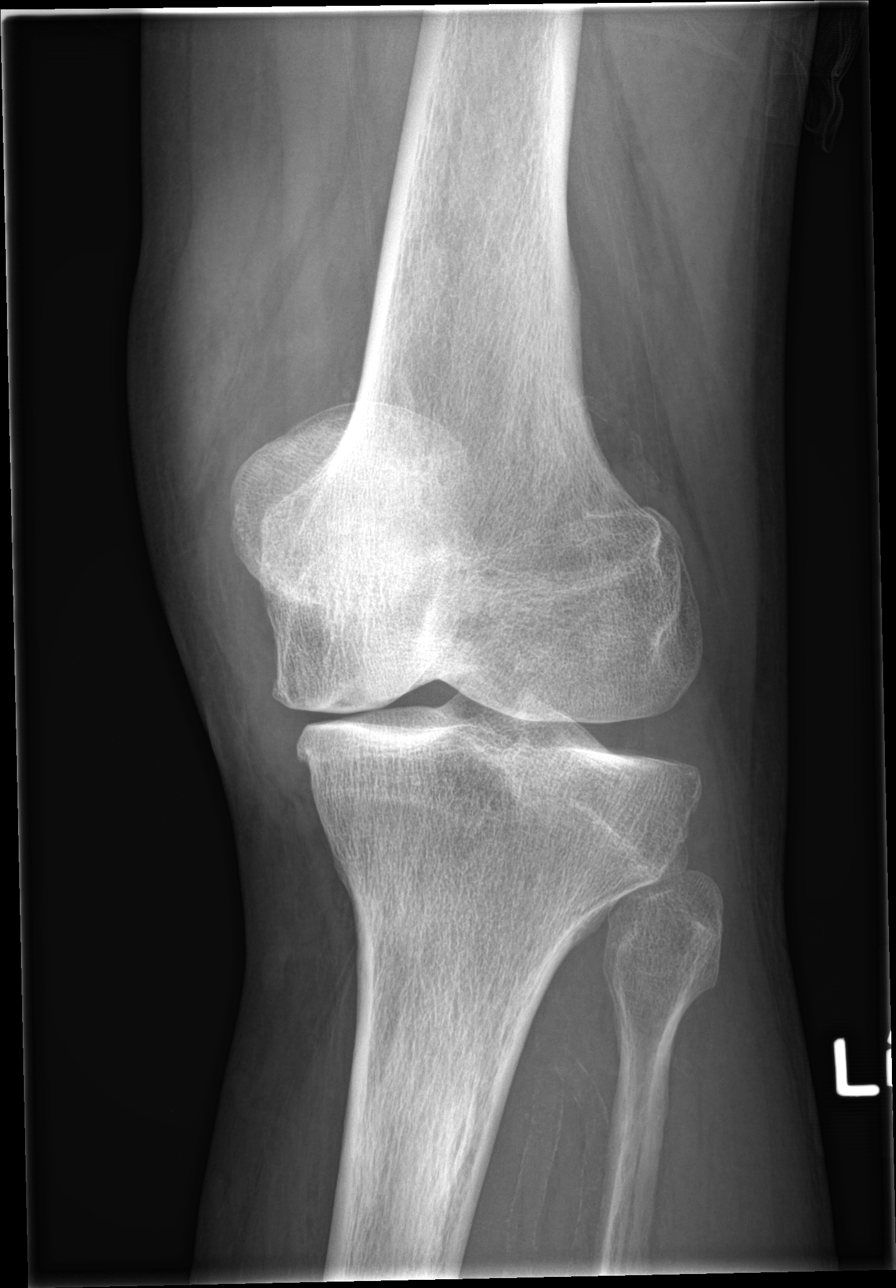

[knee obl (2 of 2)]
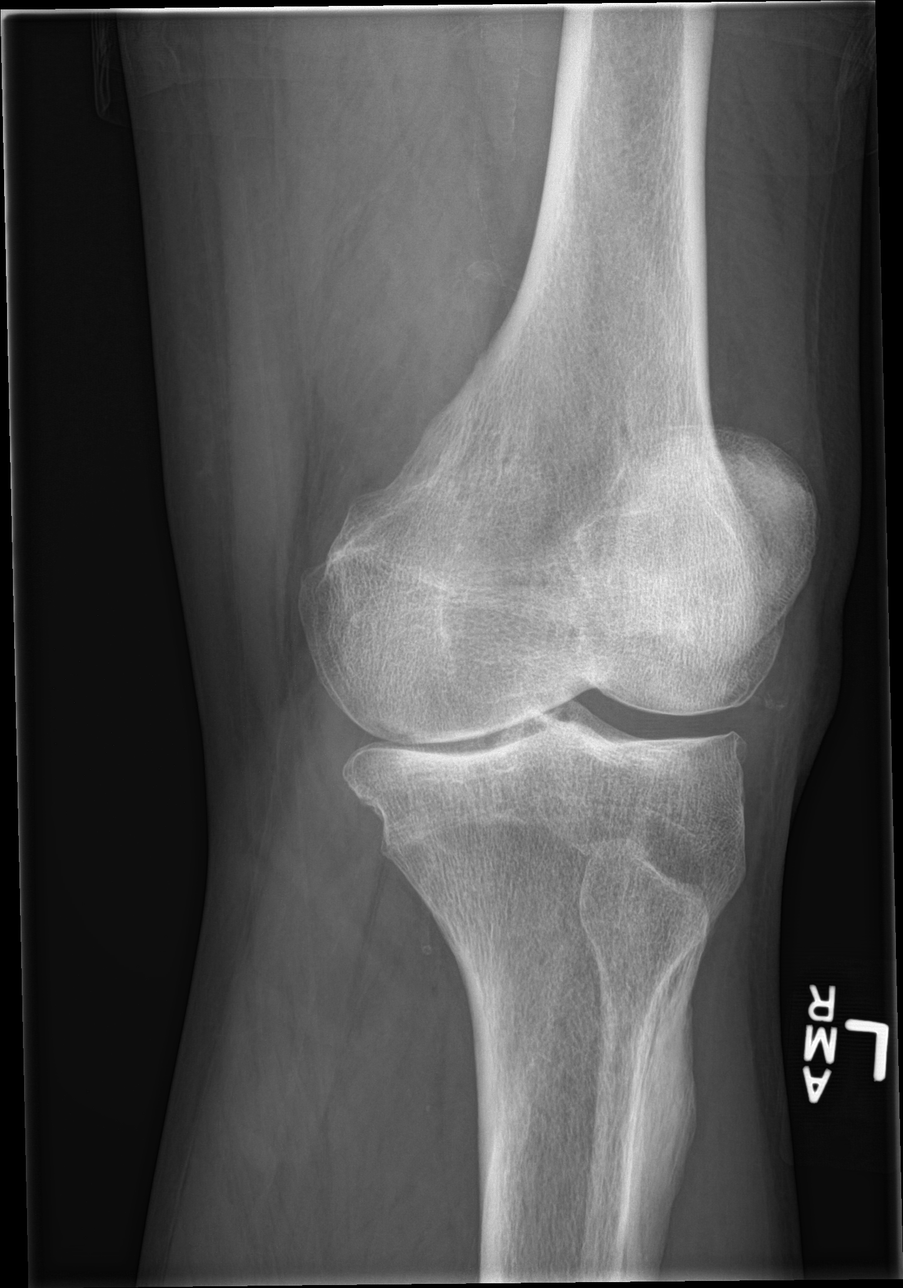

[knee lat]
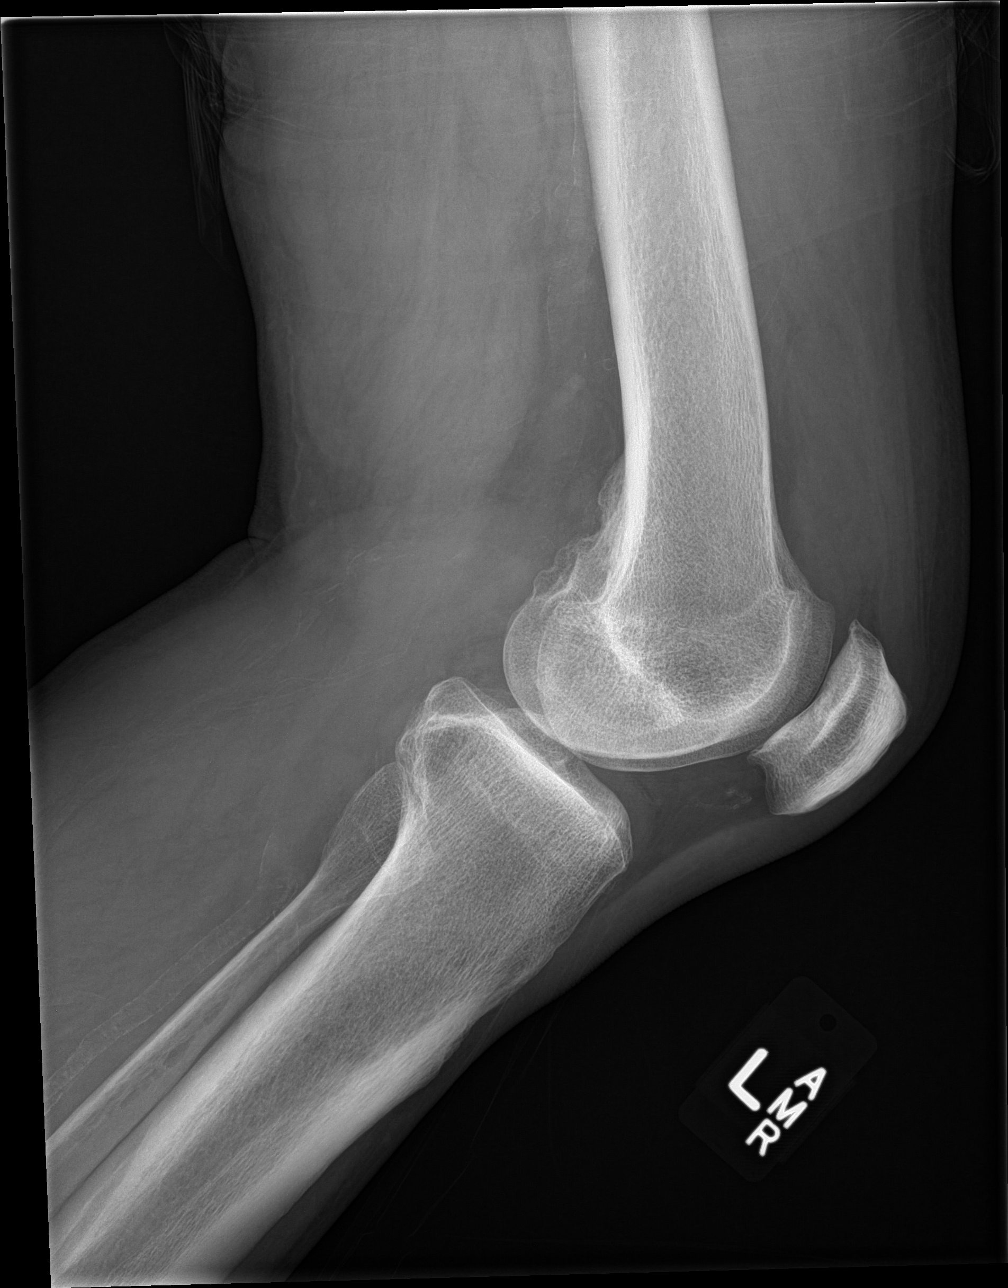

[4 of 4 positions shown; findings below may reference images not displayed]

FINDINGS: No evidence of fracture or dislocation. Probable joint effusion.
Moderate medial tibiofemoral joint space narrowing with peripheral
spurring. Minimal lateral tibiofemoral and patellofemoral spurring
preservation of joint spaces. There are vascular calcifications.
IMPRESSION: 1. Mild to moderate osteoarthritis most prominent in the medial
tibiofemoral compartment. Probable joint effusion.
2. Peripheral vascular calcifications.

## 2018-02-02 ENCOUNTER — Other Ambulatory Visit: Payer: Self-pay | Admitting: Family Medicine

## 2018-02-25 DIAGNOSIS — B351 Tinea unguium: Secondary | ICD-10-CM | POA: Diagnosis not present

## 2018-03-07 ENCOUNTER — Other Ambulatory Visit: Payer: Self-pay | Admitting: Family Medicine

## 2018-05-11 ENCOUNTER — Other Ambulatory Visit: Payer: Self-pay | Admitting: Family Medicine

## 2018-05-31 ENCOUNTER — Other Ambulatory Visit: Payer: Self-pay | Admitting: Family Medicine

## 2018-06-11 ENCOUNTER — Other Ambulatory Visit: Payer: Self-pay | Admitting: Family Medicine

## 2018-06-17 ENCOUNTER — Ambulatory Visit: Payer: BLUE CROSS/BLUE SHIELD | Admitting: Family Medicine

## 2018-06-20 ENCOUNTER — Ambulatory Visit: Payer: BLUE CROSS/BLUE SHIELD | Admitting: Family Medicine

## 2018-06-20 ENCOUNTER — Encounter: Payer: Self-pay | Admitting: Family Medicine

## 2018-06-20 VITALS — BP 138/86 | Ht 73.0 in | Wt 225.0 lb

## 2018-06-20 DIAGNOSIS — E785 Hyperlipidemia, unspecified: Secondary | ICD-10-CM | POA: Diagnosis not present

## 2018-06-20 DIAGNOSIS — Z23 Encounter for immunization: Secondary | ICD-10-CM

## 2018-06-20 DIAGNOSIS — E119 Type 2 diabetes mellitus without complications: Secondary | ICD-10-CM | POA: Diagnosis not present

## 2018-06-20 DIAGNOSIS — I10 Essential (primary) hypertension: Secondary | ICD-10-CM | POA: Diagnosis not present

## 2018-06-20 DIAGNOSIS — Z125 Encounter for screening for malignant neoplasm of prostate: Secondary | ICD-10-CM

## 2018-06-20 DIAGNOSIS — Z79899 Other long term (current) drug therapy: Secondary | ICD-10-CM | POA: Diagnosis not present

## 2018-06-20 LAB — POCT GLYCOSYLATED HEMOGLOBIN (HGB A1C): Hemoglobin A1C: 8.3 % — AB (ref 4.0–5.6)

## 2018-06-20 MED ORDER — GLIPIZIDE 5 MG PO TABS: 5.0000 mg | ORAL_TABLET | Freq: Two times a day (BID) | ORAL | 5 refills | Status: DC

## 2018-06-20 MED ORDER — DUTASTERIDE 0.5 MG PO CAPS: ORAL_CAPSULE | ORAL | 5 refills | Status: DC

## 2018-06-20 MED ORDER — METFORMIN HCL 500 MG PO TABS: 1000.0000 mg | ORAL_TABLET | Freq: Two times a day (BID) | ORAL | 5 refills | Status: DC

## 2018-06-20 MED ORDER — SILDENAFIL CITRATE 20 MG PO TABS: ORAL_TABLET | ORAL | 11 refills | Status: DC

## 2018-06-20 MED ORDER — ATORVASTATIN CALCIUM 20 MG PO TABS: 20.0000 mg | ORAL_TABLET | Freq: Every day | ORAL | 5 refills | Status: DC

## 2018-06-20 MED ORDER — TAMSULOSIN HCL 0.4 MG PO CAPS: 0.8000 mg | ORAL_CAPSULE | Freq: Every day | ORAL | 1 refills | Status: DC

## 2018-06-20 MED ORDER — FLUTICASONE PROPIONATE 50 MCG/ACT NA SUSP: NASAL | 5 refills | Status: DC

## 2018-06-20 MED ORDER — OMEPRAZOLE 20 MG PO CPDR: DELAYED_RELEASE_CAPSULE | ORAL | 5 refills | Status: DC

## 2018-06-20 MED ORDER — LOSARTAN POTASSIUM 50 MG PO TABS: ORAL_TABLET | ORAL | 5 refills | Status: DC

## 2018-06-20 NOTE — Progress Notes (Signed)
   Subjective:    [name], male    DOB: 1961-06-04, 57 y.o.   MRN: [id number]  Diabetes  He presents for his follow-up diabetic visit. He has type 2 diabetes mellitus. He is compliant with treatment all of the time. Exercise: walks one mile daily monday - friday. Home blood sugar record trend: does not check blood sugar. He sees a podiatrist (every quarter).Eye exam is current (about one year ago).   Results for orders placed or performed in visit on 06/20/18  POCT glycosylated hemoglobin (Hb A1C)  Result Value Ref Range   Hemoglobin A1C 8.3 (A) 4.0 - 5.6 %   HbA1c POC (<> result, manual entry)     HbA1c, POC (prediabetic range)     HbA1c, POC (controlled diabetic range)     . Concerns about giving blood. States he was only able to give half the amount he normally gives.   Flu vaccine.   Blood pressure medicine and blood pressure levels reviewed today with patient. Compliant with blood pressure medicine. States does not miss a dose. No obvious side effects. Blood pressure generally good when checked elsewhere. Watching salt intake.   Patient continues to take lipid medication regularly. No obvious side effects from it. Generally does not miss a dose. Prior blood work results are reviewed with patient. Patient continues to work on fat intake in diet  Patient claims compliance with diabetes medication. No obvious side effects. Reports no substantial low sugar spells. Most numbers are generally in good range when checked fasting. Generally does not miss a dose of medication. Watching diabetic diet closely  borderlinr low hgb leading to less ability to give blood per [employer] cross    Review of Systems No headache, no major weight loss or weight gain, no chest pain no back pain abdominal pain no change in bowel habits complete ROS otherwise negative     Objective:   Physical Exam  Alert and oriented, vitals reviewed and stable, NAD ENT-TM's and ext canals WNL bilat via  otoscopic exam Soft palate, tonsils and post pharynx WNL via oropharyngeal exam Neck-symmetric, no masses; thyroid nonpalpable and nontender Pulmonary-no tachypnea or accessory muscle use; Clear without wheezes via auscultation Card--no abnrml murmurs, rhythm reg and rate WNL Carotid pulses symmetric, without bruits       Assessment & Plan:  Impression arthritis, discussed patient now utilizing glucosamine sulfate.  His and doing well.   Borderline anemai/[employer] to bring this up.  Patient is bit concerned.  He has been giving a lot of blood products in recent years.  Will assess blood work to further delineate     Type 2 diab.  Suboptimal control.  Discussed.  Will need to increase glipizide to 1 rationale discussed  Hyperlipidemia.  On Lipitor well.  Will need to assess liver function tests.  And lipid profile  5.  Erectile dysfunction.  Suboptimal response to generic Viagra.  Increase to full 5 tablets  6.  Hypertension.  Good control.  Discussed maintain same meds  7.  Diabetic neuropathy.  Discussed once again.  Reason for needing tight control discussed patient sugar checks  Greater than 50% of this 40 minute face to face visit was spent in counseling and discussion and coordination of care regarding the above diagnosis/diagnosies  Also time for yearly blood work further recommendations based on results

## 2018-06-22 DIAGNOSIS — E785 Hyperlipidemia, unspecified: Secondary | ICD-10-CM | POA: Diagnosis not present

## 2018-06-22 DIAGNOSIS — Z79899 Other long term (current) drug therapy: Secondary | ICD-10-CM | POA: Diagnosis not present

## 2018-06-22 DIAGNOSIS — Z125 Encounter for screening for malignant neoplasm of prostate: Secondary | ICD-10-CM | POA: Diagnosis not present

## 2018-06-23 ENCOUNTER — Other Ambulatory Visit: Payer: Self-pay | Admitting: Family Medicine

## 2018-06-23 LAB — HEPATIC FUNCTION PANEL
ALK PHOS: 82 IU/L (ref 39–117)
ALT: 47 IU/L — ABNORMAL HIGH (ref 0–44)
AST: 39 IU/L (ref 0–40)
Albumin: 4.1 g/dL (ref 3.5–5.5)
BILIRUBIN, DIRECT: 0.35 mg/dL (ref 0.00–0.40)
Bilirubin Total: 1.1 mg/dL (ref 0.0–1.2)
Total Protein: 6.5 g/dL (ref 6.0–8.5)

## 2018-06-23 LAB — CBC WITH DIFFERENTIAL/PLATELET
Basophils Absolute: 0 10*3/uL (ref 0.0–0.2)
Basos: 0 %
EOS (ABSOLUTE): 0.2 10*3/uL (ref 0.0–0.4)
EOS: 2 %
HEMATOCRIT: 41.6 % (ref 37.5–51.0)
HEMOGLOBIN: 13.5 g/dL (ref 13.0–17.7)
IMMATURE GRANS (ABS): 0 10*3/uL (ref 0.0–0.1)
Immature Granulocytes: 0 %
LYMPHS ABS: 2.1 10*3/uL (ref 0.7–3.1)
LYMPHS: 25 %
MCH: 28.5 pg (ref 26.6–33.0)
MCHC: 32.5 g/dL (ref 31.5–35.7)
MCV: 88 fL (ref 79–97)
MONOCYTES: 8 %
Monocytes Absolute: 0.6 10*3/uL (ref 0.1–0.9)
Neutrophils Absolute: 5.4 10*3/uL (ref 1.4–7.0)
Neutrophils: 65 %
Platelets: 166 10*3/uL (ref 150–450)
RBC: 4.73 x10E6/uL (ref 4.14–5.80)
RDW: 14.8 % (ref 12.3–15.4)
WBC: 8.4 10*3/uL (ref 3.4–10.8)

## 2018-06-23 LAB — BASIC METABOLIC PANEL
BUN / CREAT RATIO: 16 (ref 9–20)
BUN: 18 mg/dL (ref 6–24)
CO2: 22 mmol/L (ref 20–29)
CREATININE: 1.13 mg/dL (ref 0.76–1.27)
Calcium: 9.9 mg/dL (ref 8.7–10.2)
Chloride: 103 mmol/L (ref 96–106)
GFR calc Af Amer: 82 mL/min/{1.73_m2} (ref >59)
GFR, EST NON AFRICAN AMERICAN: 71 mL/min/{1.73_m2} (ref >59)
GLUCOSE: 209 mg/dL — AB (ref 65–99)
Potassium: 4.3 mmol/L (ref 3.5–5.2)
SODIUM: 141 mmol/L (ref 134–144)

## 2018-06-23 LAB — LIPID PANEL
CHOL/HDL RATIO: 3.5 ratio (ref 0.0–5.0)
Cholesterol, Total: 113 mg/dL (ref 100–199)
HDL: 32 mg/dL — ABNORMAL LOW (ref >39)
LDL Calculated: 56 mg/dL (ref 0–99)
Triglycerides: 126 mg/dL (ref 0–149)
VLDL Cholesterol Cal: 25 mg/dL (ref 5–40)

## 2018-06-23 LAB — MICROALBUMIN / CREATININE URINE RATIO
Creatinine, Urine: 111.9 mg/dL
MICROALB/CREAT RATIO: 4.6 mg/g{creat} (ref 0.0–30.0)
MICROALBUM., U, RANDOM: 5.2 ug/mL

## 2018-06-23 LAB — FERRITIN: Ferritin: 42 ng/mL (ref 30–400)

## 2018-06-23 LAB — PSA: Prostate Specific Ag, Serum: 0.3 ng/mL (ref 0.0–4.0)

## 2018-06-26 ENCOUNTER — Encounter: Payer: Self-pay | Admitting: Family Medicine

## 2018-07-07 ENCOUNTER — Telehealth: Payer: Self-pay | Admitting: Family Medicine

## 2018-07-07 NOTE — Telephone Encounter (Signed)
Pt is needing a new glucose meter kit and test strips called in to [place], [place].

## 2018-07-07 NOTE — Telephone Encounter (Signed)
I spoke with the pt and he does not care as to which machine he gets. I have wrote the rx for a bg machine that the insurance will pay for also same for the test strips and lancets. Written awaiting signature.

## 2018-07-07 NOTE — Telephone Encounter (Signed)
rx faxed

## 2018-07-08 ENCOUNTER — Other Ambulatory Visit: Payer: Self-pay | Admitting: *Deleted

## 2018-07-08 ENCOUNTER — Other Ambulatory Visit: Payer: Self-pay | Admitting: Family Medicine

## 2018-07-08 MED ORDER — GLIPIZIDE 5 MG PO TABS: 5.0000 mg | ORAL_TABLET | Freq: Two times a day (BID) | ORAL | 5 refills | Status: DC

## 2018-07-29 DIAGNOSIS — B351 Tinea unguium: Secondary | ICD-10-CM | POA: Diagnosis not present

## 2018-07-29 DIAGNOSIS — G629 Polyneuropathy, unspecified: Secondary | ICD-10-CM | POA: Diagnosis not present

## 2018-08-01 ENCOUNTER — Other Ambulatory Visit: Payer: Self-pay | Admitting: Family Medicine

## 2018-08-03 ENCOUNTER — Other Ambulatory Visit: Payer: Self-pay | Admitting: Family Medicine

## 2018-08-05 ENCOUNTER — Other Ambulatory Visit: Payer: Self-pay | Admitting: Family Medicine

## 2018-09-01 ENCOUNTER — Telehealth: Payer: Self-pay | Admitting: Family Medicine

## 2018-09-01 ENCOUNTER — Other Ambulatory Visit: Payer: Self-pay | Admitting: Family Medicine

## 2018-09-01 NOTE — Telephone Encounter (Signed)
Ok this eve full, can seee tom a m

## 2018-09-01 NOTE — Telephone Encounter (Signed)
Sorry we dont call in abx for non diagnosed infections , ntbs can see this eve

## 2018-09-01 NOTE — Telephone Encounter (Signed)
Pt walked in this morning to see if Dr. Brett CanalesSteve would call him something in for his sinus infection. Pt is having drainage on both sides of face for about 3 or 4 days. No cough or fever. If able to call something in please [facility] to [facility] - [place], [place] - [address]   CB# [phone]

## 2018-09-01 NOTE — Telephone Encounter (Signed)
Left message to return call 

## 2018-09-01 NOTE — Telephone Encounter (Signed)
Pt called back, explained we needed to schedule visit, pt agrees, appt scheducled

## 2018-09-02 ENCOUNTER — Encounter: Payer: Self-pay | Admitting: Family Medicine

## 2018-09-02 ENCOUNTER — Ambulatory Visit: Payer: BLUE CROSS/BLUE SHIELD | Admitting: Family Medicine

## 2018-09-02 VITALS — Temp 97.9°F | Ht 73.0 in | Wt 224.2 lb

## 2018-09-02 DIAGNOSIS — J019 Acute sinusitis, unspecified: Secondary | ICD-10-CM

## 2018-09-02 MED ORDER — AMOXICILLIN 500 MG PO CAPS: 500.0000 mg | ORAL_CAPSULE | Freq: Three times a day (TID) | ORAL | 0 refills | Status: AC

## 2018-09-02 NOTE — Progress Notes (Signed)
   Subjective:    [name], male    DOB: 10/03/1960, 57 y.o.   MRN: [id number]  Sinusitis  This is a new problem. The current episode started in the past 7 days. Associated symptoms include congestion, coughing and sinus pressure. Pertinent negatives include no ear pain, shortness of breath or sore throat. (Runny nose) Past treatments include nothing.   Reports started with rhinorrhea from left nare and then went to right side about 4-5 days ago, discharge is green mucous. Reports congestion worse at night, feeling PND. Reports a little cough today, non-productive. No fever. No ear pain or facial pain.   Has been using flonase.  Review of Systems  Constitutional: Negative for fever.  HENT: Positive for congestion, postnasal drip, rhinorrhea and sinus pressure. Negative for ear pain and sore throat.   Eyes: Negative for discharge.  Respiratory: Positive for cough. Negative for shortness of breath and wheezing.        Objective:   Physical Exam Vitals signs and nursing note reviewed.  Constitutional:      General: He is not in acute distress.    Appearance: Normal appearance. He is not toxic-appearing.  HENT:     Head: Normocephalic and atraumatic.     Right Ear: Tympanic membrane normal.     Left Ear: Tympanic membrane normal.     Nose: Congestion present.     Mouth/Throat:     Mouth: Mucous membranes are moist.     Pharynx: Oropharynx is clear.  Eyes:     General:        Right eye: No discharge.        Left eye: No discharge.  Neck:     Musculoskeletal: Neck supple. No muscular tenderness.  Cardiovascular:     Rate and Rhythm: Normal rate and regular rhythm.     Heart sounds: Normal heart sounds.  Pulmonary:     Effort: Pulmonary effort is normal. No respiratory distress.     Breath sounds: Normal breath sounds.  Lymphadenopathy:     Cervical: No cervical adenopathy.  Skin:    General: Skin is warm and dry.  Neurological:     Mental Status: He is  alert and oriented to person, place, and time.           Assessment & Plan:  Acute rhinosinusitis  Likely post-viral etiology, will treat with abx as prescribed. Symptomatic care discussed, warning signs discussed, f/u if symptoms worsen or fail to improve.

## 2018-09-23 DIAGNOSIS — M1711 Unilateral primary osteoarthritis, right knee: Secondary | ICD-10-CM | POA: Diagnosis not present

## 2018-09-23 DIAGNOSIS — M1712 Unilateral primary osteoarthritis, left knee: Secondary | ICD-10-CM | POA: Diagnosis not present

## 2018-10-01 ENCOUNTER — Other Ambulatory Visit: Payer: Self-pay | Admitting: Family Medicine

## 2018-11-01 ENCOUNTER — Other Ambulatory Visit: Payer: Self-pay | Admitting: Family Medicine

## 2018-11-04 DIAGNOSIS — B351 Tinea unguium: Secondary | ICD-10-CM | POA: Diagnosis not present

## 2018-12-20 ENCOUNTER — Ambulatory Visit (INDEPENDENT_AMBULATORY_CARE_PROVIDER_SITE_OTHER): Payer: BLUE CROSS/BLUE SHIELD | Admitting: Family Medicine

## 2018-12-20 ENCOUNTER — Encounter: Payer: Self-pay | Admitting: Family Medicine

## 2018-12-20 ENCOUNTER — Other Ambulatory Visit: Payer: Self-pay

## 2018-12-20 VITALS — Ht 73.0 in | Wt 220.0 lb

## 2018-12-20 DIAGNOSIS — E119 Type 2 diabetes mellitus without complications: Secondary | ICD-10-CM | POA: Diagnosis not present

## 2018-12-20 DIAGNOSIS — I10 Essential (primary) hypertension: Secondary | ICD-10-CM | POA: Diagnosis not present

## 2018-12-20 DIAGNOSIS — M25562 Pain in left knee: Secondary | ICD-10-CM

## 2018-12-20 MED ORDER — OMEPRAZOLE 20 MG PO CPDR: DELAYED_RELEASE_CAPSULE | ORAL | 5 refills | Status: DC

## 2018-12-20 MED ORDER — GLIPIZIDE 5 MG PO TABS: 5.0000 mg | ORAL_TABLET | Freq: Two times a day (BID) | ORAL | 5 refills | Status: DC

## 2018-12-20 MED ORDER — ATORVASTATIN CALCIUM 20 MG PO TABS: 20.0000 mg | ORAL_TABLET | Freq: Every day | ORAL | 5 refills | Status: DC

## 2018-12-20 MED ORDER — TAMSULOSIN HCL 0.4 MG PO CAPS: 0.8000 mg | ORAL_CAPSULE | Freq: Every day | ORAL | 5 refills | Status: DC

## 2018-12-20 MED ORDER — LOSARTAN POTASSIUM 50 MG PO TABS: ORAL_TABLET | ORAL | 5 refills | Status: DC

## 2018-12-20 MED ORDER — DUTASTERIDE 0.5 MG PO CAPS: ORAL_CAPSULE | ORAL | 5 refills | Status: DC

## 2018-12-20 NOTE — Progress Notes (Signed)
   Subjective:    [name], male    DOB: 14-May-1961, 58 y.o.   MRN: [id number]  Diabetes  He presents for his follow-up diabetic visit. He has type 2 diabetes mellitus. There are no hypoglycemic associated symptoms. There are no diabetic associated symptoms. There are no hypoglycemic complications. There are no diabetic complications. Risk factors for coronary artery disease include hypertension. Compliance with diabetes treatment: pt states he has not been checking sugar regular  He sees a podiatrist.Eye exam is not current.  Hypertension  This is a chronic problem. There are no compliance problems.    Pt states his knees have been aching pretty bad lately. Taking 2 Aleve every morning. Can he increased that?   Virtual Visit via Telephone Note  I connected with [name] [name] on 12/20/18 at 10:00 AM EDT by telephone and verified that I am speaking with the correct person using two identifiers.   I discussed the limitations, risks, security and privacy concerns of performing an evaluation and management service by telephone and the availability of in person appointments. I also discussed with the patient that there may be a patient responsible charge related to this service. The patient expressed understanding and agreed to proceed.   History of Present Illness:    Observations/Objective:   Assessment and Plan:   Follow Up Instructions:    I discussed the assessment and treatment plan with the patient. The patient was provided an opportunity to ask questions and all were answered. The patient agreed with the plan and demonstrated an understanding of the instructions.   The patient was advised to call back or seek an in-person evaluation if the symptoms worsen or if the condition fails to improve as anticipated.  I provided 25 minutes of non-face-to-face time during this encounter.  Blood pressure medicine and blood pressure levels reviewed today with patient.  Compliant with blood pressure medicine. States does not miss a dose. No obvious side effects. Blood pressure generally good when checked elsewhere. Watching salt intake.  Patient claims compliance with diabetes medication. No obvious side effects. Reports no substantial low sugar spells. Most numbers are generally in good range when checked fasting. Generally does not miss a dose of medication. 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  Diet discussed compliance discussed maintain same dose  2.  Hypertension.  Good control checked elsewhere discussed maintain same  3.  Type 2 diabetes ongoing diet discussed to maintain same meds  4.  Arthritis knees worsening.  Renal function fine.  Therefore may increase to twice daily rationale discussed  Follow-up in 6 months for wellness and chronic

## 2019-01-01 ENCOUNTER — Other Ambulatory Visit: Payer: Self-pay | Admitting: Family Medicine

## 2019-01-05 ENCOUNTER — Other Ambulatory Visit: Payer: Self-pay | Admitting: Family Medicine

## 2019-01-19 DIAGNOSIS — M170 Bilateral primary osteoarthritis of knee: Secondary | ICD-10-CM | POA: Diagnosis not present

## 2019-01-30 ENCOUNTER — Other Ambulatory Visit: Payer: Self-pay | Admitting: Family Medicine

## 2019-02-03 DIAGNOSIS — B351 Tinea unguium: Secondary | ICD-10-CM | POA: Diagnosis not present

## 2019-02-06 ENCOUNTER — Other Ambulatory Visit: Payer: Self-pay | Admitting: Family Medicine

## 2019-03-03 ENCOUNTER — Other Ambulatory Visit: Payer: Self-pay | Admitting: Family Medicine

## 2019-04-05 ENCOUNTER — Other Ambulatory Visit: Payer: Self-pay | Admitting: Family Medicine

## 2019-04-17 ENCOUNTER — Telehealth: Payer: Self-pay | Admitting: Family Medicine

## 2019-04-17 ENCOUNTER — Other Ambulatory Visit: Payer: Self-pay | Admitting: *Deleted

## 2019-04-17 MED ORDER — PREDNISONE 20 MG PO TABS: ORAL_TABLET | ORAL | 0 refills | Status: DC

## 2019-04-17 NOTE — Telephone Encounter (Signed)
Pt has poison oak for the first time. He has tried triamcinolone - acetonide ointment. He cant tell where that has helped. He also has used calamine lotion. He states that has seemed to help with drying it up but it is spreading. It is on both arms and moving up and is also under his arms.   [id number] - [place], [place] AT [place]

## 2019-04-17 NOTE — Telephone Encounter (Signed)
Nurses please talk with patient Prednisone will do the best to help him But prednisone could cause his sugars to go up You have to be very careful while he is on it If he is interested in using prednisone I recommend the following Prednisone 20 mg, take 3 tablets daily for the next 2 days, then 2 tablets daily for 3 days, then 1 tablet daily for 3 days 15 tablets Monitor sugars closely if any problems notify us

## 2019-04-17 NOTE — Telephone Encounter (Signed)
Discussed with pt. Pt verbalized understanding. Pt did want prednisone. Med sent to pharm.

## 2019-04-25 DIAGNOSIS — M1712 Unilateral primary osteoarthritis, left knee: Secondary | ICD-10-CM | POA: Diagnosis not present

## 2019-05-02 DIAGNOSIS — M1712 Unilateral primary osteoarthritis, left knee: Secondary | ICD-10-CM | POA: Diagnosis not present

## 2019-05-05 ENCOUNTER — Other Ambulatory Visit: Payer: Self-pay | Admitting: Family Medicine

## 2019-05-05 DIAGNOSIS — B351 Tinea unguium: Secondary | ICD-10-CM | POA: Diagnosis not present

## 2019-05-09 DIAGNOSIS — M1712 Unilateral primary osteoarthritis, left knee: Secondary | ICD-10-CM | POA: Diagnosis not present

## 2019-06-04 ENCOUNTER — Other Ambulatory Visit: Payer: Self-pay | Admitting: Family Medicine

## 2019-06-05 NOTE — Telephone Encounter (Signed)
Ok plus one ref, rec chronic visit next mo

## 2019-06-22 DIAGNOSIS — M25562 Pain in left knee: Secondary | ICD-10-CM | POA: Diagnosis not present

## 2019-06-22 DIAGNOSIS — M1712 Unilateral primary osteoarthritis, left knee: Secondary | ICD-10-CM | POA: Diagnosis not present

## 2019-06-28 ENCOUNTER — Other Ambulatory Visit: Payer: Self-pay | Admitting: Family Medicine

## 2019-06-29 NOTE — Telephone Encounter (Signed)
Please schedule and then route back to nurses to send in refills thanks 

## 2019-06-29 NOTE — Telephone Encounter (Signed)
sched 6 mo then may rep all time s one

## 2019-07-04 ENCOUNTER — Telehealth: Payer: Self-pay | Admitting: *Deleted

## 2019-07-04 NOTE — Telephone Encounter (Signed)
Fax from [facility] metformin 500mg  2 bid. Last med check march. No upcoming appt.

## 2019-07-04 NOTE — Telephone Encounter (Signed)
[facility] 

## 2019-07-05 NOTE — Telephone Encounter (Signed)
Please contact pt to set up appt; then may route back to nurses. Thank you 

## 2019-07-05 NOTE — Telephone Encounter (Signed)
sched six mo f u virt or face to face, ref times one

## 2019-07-06 ENCOUNTER — Other Ambulatory Visit: Payer: Self-pay | Admitting: *Deleted

## 2019-07-06 NOTE — Telephone Encounter (Signed)
Left message with pt's wife to have pt to call us ° °Pt's dad is being put in Hospice care today ° ° °

## 2019-07-06 NOTE — Telephone Encounter (Signed)
Left message with pt's wife to have pt to call us  Pt's dad is being put in Hospice care today

## 2019-07-10 ENCOUNTER — Other Ambulatory Visit: Payer: Self-pay | Admitting: Family Medicine

## 2019-07-11 ENCOUNTER — Other Ambulatory Visit: Payer: Self-pay

## 2019-07-11 ENCOUNTER — Ambulatory Visit (INDEPENDENT_AMBULATORY_CARE_PROVIDER_SITE_OTHER): Payer: BC Managed Care – PPO | Admitting: Family Medicine

## 2019-07-11 DIAGNOSIS — E785 Hyperlipidemia, unspecified: Secondary | ICD-10-CM | POA: Diagnosis not present

## 2019-07-11 DIAGNOSIS — E119 Type 2 diabetes mellitus without complications: Secondary | ICD-10-CM | POA: Diagnosis not present

## 2019-07-11 DIAGNOSIS — I10 Essential (primary) hypertension: Secondary | ICD-10-CM | POA: Diagnosis not present

## 2019-07-11 MED ORDER — OMEPRAZOLE 20 MG PO CPDR: DELAYED_RELEASE_CAPSULE | ORAL | 5 refills | Status: DC

## 2019-07-11 MED ORDER — METFORMIN HCL 500 MG PO TABS: 1000.0000 mg | ORAL_TABLET | Freq: Two times a day (BID) | ORAL | 5 refills | Status: DC

## 2019-07-11 MED ORDER — GLIPIZIDE 5 MG PO TABS: ORAL_TABLET | ORAL | 5 refills | Status: DC

## 2019-07-11 MED ORDER — DUTASTERIDE 0.5 MG PO CAPS: ORAL_CAPSULE | ORAL | 5 refills | Status: DC

## 2019-07-11 MED ORDER — ATORVASTATIN CALCIUM 20 MG PO TABS: 20.0000 mg | ORAL_TABLET | Freq: Every day | ORAL | 5 refills | Status: DC

## 2019-07-11 MED ORDER — LOSARTAN POTASSIUM 50 MG PO TABS: ORAL_TABLET | ORAL | 5 refills | Status: DC

## 2019-07-11 MED ORDER — TAMSULOSIN HCL 0.4 MG PO CAPS: 0.8000 mg | ORAL_CAPSULE | Freq: Every day | ORAL | 5 refills | Status: DC

## 2019-07-11 MED ORDER — SILDENAFIL CITRATE 20 MG PO TABS: ORAL_TABLET | ORAL | 5 refills | Status: DC

## 2019-07-11 NOTE — Telephone Encounter (Signed)
Phone appt scheduled for 07/11/2019

## 2019-07-11 NOTE — Progress Notes (Signed)
   Subjective:    [name], male    DOB: 03-29-1961, 58 y.o.   MRN: [id number]  Diabetes He presents for his follow-up diabetic visit. He has type 2 diabetes mellitus. Risk factors for coronary artery disease include diabetes mellitus, dyslipidemia and hypertension. Current diabetic treatment includes oral agent (dual therapy). His weight is stable. He is following a diabetic diet.   Patient states he really hasn't been checking his sugar but he is feeling fine and scheduled for knee replacement December 7.2020  Virtual Visit via Video Note  I connected with [name] [name] on 07/11/19 at  3:00 PM EDT by a video enabled telemedicine application and verified that I am speaking with the correct person using two identifiers.  Location: Patient: home Provider: office   I discussed the limitations of evaluation and management by telemedicine and the availability of in person appointments. The patient expressed understanding and agreed to proceed.  History of Present Illness:    Observations/Objective:   Assessment and Plan:   Follow Up Instructions:    I discussed the assessment and treatment plan with the patient. The patient was provided an opportunity to ask questions and all were answered. The patient agreed with the plan and demonstrated an understanding of the instructions.   The patient was advised to call back or seek an in-person evaluation if the symptoms worsen or if the condition fails to improve as anticipated.  I provided  minutes of non-face-to-face time during this encounter.  Blood pressure medicine and blood pressure levels reviewed today with patient. Compliant with blood pressure medicine. States does not miss a dose. No obvious side effects. Blood pressure generally good when checked elsewhere. Watching salt intake.  Patient claims compliance with diabetes medication. No obvious side effects. Reports no substantial low sugar spells. Most numbers  are generally in good range when checked fasting. Generally does not miss a dose of medication. Watching diabetic diet closely  Patient continues to take lipid medication regularly. No obvious side effects from it. Generally does not miss a dose. Prior blood work results are reviewed with patient. Patient continues to work on fat intake in diet      Review of Systems No headache, no major weight loss or weight gain, no chest pain no back pain abdominal pain no change in bowel habits complete ROS otherwise negative     Objective:   Physical Exam  Virtual      Assessment & Plan:  Impression 1 type 2 diabetes.  Exact control uncertain discussed check A1c.  Compliance discussed diet discussed  2.  Hypertension.  Blood pressure good when checked elsewhere salt intake discussed maintain meds compliance discussed  3.  Hyperlipidemia uncertain.  Check blood work maintain meds meds refilled compliance discussed  4.  Erectile dysfunction ongoing challenge.  Worsening definitely wants refill on prior meds will do  5.  Worsening arthritis due to have joint replacement soon.  Will need tight control diabetes  Flu shot recommended diet exercise class medications refilled

## 2019-07-12 ENCOUNTER — Other Ambulatory Visit: Payer: Self-pay

## 2019-07-12 ENCOUNTER — Other Ambulatory Visit (INDEPENDENT_AMBULATORY_CARE_PROVIDER_SITE_OTHER): Payer: BC Managed Care – PPO | Admitting: *Deleted

## 2019-07-12 DIAGNOSIS — I10 Essential (primary) hypertension: Secondary | ICD-10-CM | POA: Diagnosis not present

## 2019-07-12 DIAGNOSIS — E119 Type 2 diabetes mellitus without complications: Secondary | ICD-10-CM | POA: Diagnosis not present

## 2019-07-12 DIAGNOSIS — Z23 Encounter for immunization: Secondary | ICD-10-CM

## 2019-07-13 LAB — MICROALBUMIN / CREATININE URINE RATIO
Creatinine, Urine: 193 mg/dL
Microalb/Creat Ratio: 2 mg/g creat (ref 0–29)
Microalbumin, Urine: 4.1 ug/mL

## 2019-07-13 LAB — BASIC METABOLIC PANEL
BUN/Creatinine Ratio: 12 (ref 9–20)
BUN: 12 mg/dL (ref 6–24)
CO2: 24 mmol/L (ref 20–29)
Calcium: 9.1 mg/dL (ref 8.7–10.2)
Chloride: 103 mmol/L (ref 96–106)
Creatinine, Ser: 0.99 mg/dL (ref 0.76–1.27)
GFR calc Af Amer: 96 mL/min/{1.73_m2} (ref >59)
GFR calc non Af Amer: 83 mL/min/{1.73_m2} (ref >59)
Glucose: 179 mg/dL — ABNORMAL HIGH (ref 65–99)
Potassium: 4.7 mmol/L (ref 3.5–5.2)
Sodium: 139 mmol/L (ref 134–144)

## 2019-07-13 LAB — HEPATIC FUNCTION PANEL
ALT: 18 IU/L (ref 0–44)
AST: 14 IU/L (ref 0–40)
Albumin: 3.9 g/dL (ref 3.8–4.9)
Alkaline Phosphatase: 83 IU/L (ref 39–117)
Bilirubin Total: 0.8 mg/dL (ref 0.0–1.2)
Bilirubin, Direct: 0.25 mg/dL (ref 0.00–0.40)
Total Protein: 6.2 g/dL (ref 6.0–8.5)

## 2019-07-13 LAB — HEMOGLOBIN A1C
Est. average glucose Bld gHb Est-mCnc: 177 mg/dL
Hgb A1c MFr Bld: 7.8 % — ABNORMAL HIGH (ref 4.8–5.6)

## 2019-07-13 LAB — LIPID PANEL
Chol/HDL Ratio: 3.2 ratio (ref 0.0–5.0)
Cholesterol, Total: 117 mg/dL (ref 100–199)
HDL: 37 mg/dL — ABNORMAL LOW (ref >39)
LDL Chol Calc (NIH): 55 mg/dL (ref 0–99)
Triglycerides: 143 mg/dL (ref 0–149)
VLDL Cholesterol Cal: 25 mg/dL (ref 5–40)

## 2019-07-18 MED ORDER — GLIPIZIDE 5 MG PO TABS: ORAL_TABLET | ORAL | 5 refills | Status: DC

## 2019-07-18 NOTE — Addendum Note (Signed)
Addended by: Dairl Ponder on: 07/18/2019 03:13 PM   Modules accepted: Orders

## 2019-07-20 ENCOUNTER — Other Ambulatory Visit: Payer: Self-pay | Admitting: Family Medicine

## 2019-07-21 DIAGNOSIS — B351 Tinea unguium: Secondary | ICD-10-CM | POA: Diagnosis not present

## 2019-07-23 DIAGNOSIS — Z8616 Personal history of COVID-19: Secondary | ICD-10-CM

## 2019-07-23 HISTORY — DX: Personal history of COVID-19: Z86.16

## 2019-07-31 ENCOUNTER — Other Ambulatory Visit: Payer: Self-pay | Admitting: Family Medicine

## 2019-08-15 ENCOUNTER — Ambulatory Visit (INDEPENDENT_AMBULATORY_CARE_PROVIDER_SITE_OTHER): Payer: BC Managed Care – PPO | Admitting: Family Medicine

## 2019-08-15 ENCOUNTER — Other Ambulatory Visit: Payer: Self-pay

## 2019-08-15 DIAGNOSIS — Z20828 Contact with and (suspected) exposure to other viral communicable diseases: Secondary | ICD-10-CM

## 2019-08-15 DIAGNOSIS — U071 COVID-19: Secondary | ICD-10-CM

## 2019-08-15 DIAGNOSIS — Z20822 Contact with and (suspected) exposure to covid-19: Secondary | ICD-10-CM

## 2019-08-15 MED ORDER — CEFPROZIL 500 MG PO TABS: 500.0000 mg | ORAL_TABLET | Freq: Two times a day (BID) | ORAL | 0 refills | Status: DC

## 2019-08-15 MED ORDER — ALBUTEROL SULFATE HFA 108 (90 BASE) MCG/ACT IN AERS: 2.0000 | INHALATION_SPRAY | Freq: Four times a day (QID) | RESPIRATORY_TRACT | 0 refills | Status: DC | PRN

## 2019-08-15 NOTE — Progress Notes (Signed)
   Subjective:  Audio   [name], male    DOB: [birth year], 58 y.o.   MRN: [id number]  Sinusitis This is a new problem. Episode onset: 3 -4 days. Associated symptoms include congestion and coughing. Treatments tried: nyquil.   Virtual Visit via Telephone Note  I connected with Micah Noel on 08/15/19 at  4:10 PM EST by telephone and verified that I am speaking with the correct person using two identifiers.  Location: Patient: home Provider: office   I discussed the limitations, risks, security and privacy concerns of performing an evaluation and management service by telephone and the availability of in person appointments. I also discussed with the patient that there may be a patient responsible charge related to this service. The patient expressed understanding and agreed to proceed.   History of Present Illness:    Observations/Objective:   Assessment and Plan:   Follow Up Instructions:    I discussed the assessment and treatment plan with the patient. The patient was provided an opportunity to ask questions and all were answered. The patient agreed with the plan and demonstrated an understanding of the instructions.   The patient was advised to call back or seek an in-person evaluation if the symptoms worsen or if the condition fails to improve as anticipated.  I provided  minutes of non-face-to-face time during this encounter.   couhing bad at night Fatigue early on 4 5 days ago  Bad spell coughing   temp fever, 100 max   Energy level good      Review of Systems  HENT: Positive for congestion.   Respiratory: Positive for cough.        Objective:   Physical Exam  Virtual      Assessment & Plan:  Impression probable COVID-19.  Patient has elements suggestive of sinusitis also will cover.  Testing strongly recommended.  Patient has developed a cough.  Noticeably wheezy over the phone.  Albuterol prescribed.  Warning signs  discussed carefully

## 2019-08-16 ENCOUNTER — Telehealth: Payer: Self-pay | Admitting: Family Medicine

## 2019-08-16 ENCOUNTER — Other Ambulatory Visit: Payer: Self-pay | Admitting: *Deleted

## 2019-08-16 ENCOUNTER — Other Ambulatory Visit: Payer: Self-pay

## 2019-08-16 DIAGNOSIS — Z20822 Contact with and (suspected) exposure to covid-19: Secondary | ICD-10-CM

## 2019-08-16 MED ORDER — BENZONATATE 100 MG PO CAPS: 100.0000 mg | ORAL_CAPSULE | Freq: Two times a day (BID) | ORAL | 1 refills | Status: DC | PRN

## 2019-08-16 MED ORDER — ALBUTEROL SULFATE HFA 108 (90 BASE) MCG/ACT IN AERS: 2.0000 | INHALATION_SPRAY | Freq: Four times a day (QID) | RESPIRATORY_TRACT | 0 refills | Status: DC | PRN

## 2019-08-16 MED ORDER — BENZONATATE 100 MG PO CAPS: 100.0000 mg | ORAL_CAPSULE | Freq: Three times a day (TID) | ORAL | 1 refills | Status: DC | PRN

## 2019-08-16 NOTE — Telephone Encounter (Signed)
Patient said that [facility] said they did not get the inhaler rx yesterday but they did get the antibiotic sent.  Patient asking that we resend the albuterol.  Also asking for something to help with cough.  He had some old cough meds that has expired in 2019 and isn't sure he should take it.  [facility]

## 2019-08-16 NOTE — Telephone Encounter (Signed)
Med sent to pharm. Pt notified.  

## 2019-08-16 NOTE — Telephone Encounter (Signed)
I resent the inhaler to pharm that was sent in yesterday and pt states he would like cough med sent in that does not make him drowsy.

## 2019-08-16 NOTE — Telephone Encounter (Signed)
I recommend Tessalon 100 mg 1 3 times daily as needed, #20, 1 refill

## 2019-08-18 ENCOUNTER — Other Ambulatory Visit: Payer: Self-pay | Admitting: Family Medicine

## 2019-08-18 LAB — NOVEL CORONAVIRUS, NAA: SARS-CoV-2, NAA: DETECTED — AB

## 2019-08-18 MED ORDER — HYDROCODONE-HOMATROPINE 5-1.5 MG/5ML PO SYRP: ORAL_SOLUTION | ORAL | 0 refills | Status: DC

## 2019-08-18 NOTE — Telephone Encounter (Signed)
Pt had COVID testing done and results are in Epic. Please advise. Thank you

## 2019-08-18 NOTE — Telephone Encounter (Signed)
Pt contacted and verbalized understanding. Pt informed to quarantine for 10 days after symptom started. Pt verbalized understanding. Pt states the only issue he is having is a non productive deep cough. Pt would something for cough sent in. Pt states that he has used Hycodan cough syrup before and that really helped. Please advise. Thank you   [facility]

## 2019-08-18 NOTE — Telephone Encounter (Signed)
Call pt and notify, give general advice appropriate regarding quaranting ten d after symtoms started, let him kno h dept will be notified, and he should aso hear from them

## 2019-08-18 NOTE — Addendum Note (Signed)
Addended by: Mikey Kirschner on: 08/18/2019 11:25 AM   Modules accepted: Orders

## 2019-08-18 NOTE — Telephone Encounter (Signed)
Hycodan 3 oz one tpn q six hrws prn cough, if swignificant sob develops pt will need to go to the er for further assessment

## 2019-08-18 NOTE — Telephone Encounter (Signed)
Pt aware that cough syrup will be sent in.

## 2019-08-18 NOTE — Addendum Note (Signed)
Addended by: Vicente Males on: 08/18/2019 11:12 AM   Modules accepted: Orders

## 2019-08-19 ENCOUNTER — Other Ambulatory Visit: Payer: Self-pay | Admitting: Nurse Practitioner

## 2019-08-19 ENCOUNTER — Telehealth: Payer: Self-pay | Admitting: Nurse Practitioner

## 2019-08-19 DIAGNOSIS — U071 COVID-19: Secondary | ICD-10-CM

## 2019-08-19 NOTE — Telephone Encounter (Signed)
  I connected by phone with [name] [name] on 08/19/2019 at 12:57 PM to discuss the potential use of an new treatment for mild to moderate COVID-19 viral infection in non-hospitalized patients. Current symptoms for patient include a "terrible cough" and occasional SOB.  He believes symptoms started about 6 days ago.  Tested positive 08/16/2019.    This patient is a 58 y.o. male that meets the FDA criteria for Emergency Use Authorization of bamlanivimab:  Has a (+) direct SARS-CoV-2 viral test result (08/16/2019)  Has mild or moderate COVID-19   Is ? 60 years of age and weighs ? 40 kg  Is NOT hospitalized due to COVID-19  Is NOT requiring oxygen therapy or requiring an increase in baseline oxygen flow rate due to COVID-19  Is within 10 days of symptom onset  Has at least one of the high risk factor(s) for progression to severe COVID-19 and/or hospitalization as defined in EUA.  Specific high risk criteria : Diabetes, hypertension  Patient Active Problem List   Diagnosis Date Noted  . Hyperlipidemia LDL goal <100 06/20/2018  . Erectile dysfunction 12/16/2016  . Essential hypertension 06/19/2016  . Neuropathy due to type 2 diabetes mellitus ([place]) 06/19/2016  . Diabetes ([place]) 03/20/2015  . Diverticulosis of colon without hemorrhage   . Elevated blood pressure 08/01/2014  . Prostate hypertrophy 06/25/2014  . Esophageal reflux 06/25/2014    I have spoken and communicated the following to the patient or parent/caregiver:  1. FDA has authorized the emergency use of bamlanivimab for the treatment of mild to moderate COVID-19 in adults and pediatric patients with positive results of direct SARS-CoV-2 viral testing who are 30 years of age and older weighing at least 40 kg, and who are at high risk for progressing to severe COVID-19 and/or hospitalization.  2. The significant known and potential risks and benefits of bamlanivimab, and the extent to which such potential risks and benefits are  unknown.  3. Information on available alternative treatments and the risks and benefits of those alternatives, including clinical trials.  4. Patients treated with bamlanivimab should continue to self-isolate and use infection control measures (e.g., wear mask, isolate, social distance, avoid sharing personal items, clean and disinfect "high touch" surfaces, and frequent handwashing) according to CDC guidelines.   5. The patient or parent/caregiver has the option to accept or refuse bamlanivimab.  After reviewing this information with the patient, The patient agreed to proceed with receiving the infusion of bamlanivimab and will be provided a copy of the Fact sheet prior to receiving the infusion.  [name] 08/19/2019 12:57 PM

## 2019-08-19 NOTE — Addendum Note (Signed)
Addended by: Marnee Guarneri T on: 08/19/2019 01:31 PM   Modules accepted: Orders, SmartSet

## 2019-08-19 NOTE — Assessment & Plan Note (Signed)
Patient qualifies for Bamlanivimab and would like infusion.  Will alert infusion team.

## 2019-08-19 NOTE — Progress Notes (Signed)
Bamlanivimab order set.  Refer to telephone encounter for further.

## 2019-08-20 NOTE — Telephone Encounter (Signed)
Spoke with patient to confirm 11AM appointment on 11/30 for Bamlanivimab infusion. Location clarified. Patient verbalized understanding.

## 2019-08-21 ENCOUNTER — Ambulatory Visit (HOSPITAL_COMMUNITY)
Admission: RE | Admit: 2019-08-21 | Discharge: 2019-08-21 | Disposition: A | Discharge status: Home / Self Care | Payer: BC Managed Care – PPO | Source: Ambulatory Visit | Attending: Critical Care Medicine | Admitting: Critical Care Medicine

## 2019-08-21 DIAGNOSIS — U071 COVID-19: Secondary | ICD-10-CM | POA: Diagnosis not present

## 2019-08-21 MED ORDER — ALBUTEROL SULFATE HFA 108 (90 BASE) MCG/ACT IN AERS: 2.0000 | INHALATION_SPRAY | Freq: Once | RESPIRATORY_TRACT | Status: DC | PRN

## 2019-08-21 MED ORDER — SODIUM CHLORIDE 0.9 % IV SOLN
700.0000 mg | Freq: Once | INTRAVENOUS | Status: AC
  Administered 2019-08-21: 700 mg via INTRAVENOUS
  Filled 2019-08-21: qty 20

## 2019-08-21 MED ORDER — METHYLPREDNISOLONE SODIUM SUCC 125 MG IJ SOLR: 125.0000 mg | Freq: Once | INTRAMUSCULAR | Status: DC | PRN

## 2019-08-21 MED ORDER — DIPHENHYDRAMINE HCL 50 MG/ML IJ SOLN: 50.0000 mg | Freq: Once | INTRAMUSCULAR | Status: DC | PRN

## 2019-08-21 MED ORDER — FAMOTIDINE IN NACL 20-0.9 MG/50ML-% IV SOLN: 20.0000 mg | Freq: Once | INTRAVENOUS | Status: DC | PRN

## 2019-08-21 MED ORDER — EPINEPHRINE 0.3 MG/0.3ML IJ SOAJ: 0.3000 mg | Freq: Once | INTRAMUSCULAR | Status: DC | PRN

## 2019-08-21 MED ORDER — SODIUM CHLORIDE 0.9 % IV SOLN: INTRAVENOUS | Status: DC | PRN

## 2019-08-21 NOTE — Progress Notes (Signed)
  Diagnosis: COVID-19  Physician: Dr. Wright  Procedure: bamlanivimab infusion Provided patient with bamlanivimab fact sheet for patients, parents and caregivers prior to infusion.  Complications: No immediate complications noted.  Discharge: Discharged home   [name] 08/21/2019  

## 2019-08-22 ENCOUNTER — Telehealth: Payer: Self-pay | Admitting: Family Medicine

## 2019-08-22 MED ORDER — HYDROCODONE-HOMATROPINE 5-1.5 MG/5ML PO SYRP: ORAL_SOLUTION | ORAL | 0 refills | Status: DC

## 2019-08-22 NOTE — Telephone Encounter (Signed)
May ref times one,

## 2019-08-22 NOTE — Telephone Encounter (Signed)
Patient's wife calling saying patient will need a refill on the HYDROcodone-homatropine (HYCODAN) 5-1.5 MG/5ML syrup She said he is doing ok, had his infusion yesterday, but still coughing a lot at night. She said she is doing much better.  [facility]

## 2019-08-22 NOTE — Telephone Encounter (Signed)
Med pended. I will call pt after its signed.

## 2019-08-22 NOTE — Telephone Encounter (Signed)
Med sent to pharm. Tried to call no answer 

## 2019-08-23 NOTE — Telephone Encounter (Signed)
Pt.notified

## 2019-08-25 ENCOUNTER — Encounter (HOSPITAL_COMMUNITY): Admission: RE | Admit: 2019-08-25 | Payer: BC Managed Care – PPO | Source: Ambulatory Visit

## 2019-08-31 ENCOUNTER — Ambulatory Visit (HOSPITAL_COMMUNITY): Payer: BLUE CROSS/BLUE SHIELD | Admitting: Occupational Therapy

## 2019-08-31 ENCOUNTER — Encounter (HOSPITAL_COMMUNITY): Payer: Self-pay

## 2019-08-31 ENCOUNTER — Ambulatory Visit (HOSPITAL_COMMUNITY): Payer: BC Managed Care – PPO | Admitting: Physical Therapy

## 2019-09-02 ENCOUNTER — Other Ambulatory Visit: Payer: Self-pay | Admitting: Family Medicine

## 2019-09-04 ENCOUNTER — Encounter (HOSPITAL_COMMUNITY): Payer: BLUE CROSS/BLUE SHIELD | Admitting: Physical Therapy

## 2019-09-04 ENCOUNTER — Ambulatory Visit (HOSPITAL_COMMUNITY): Payer: BLUE CROSS/BLUE SHIELD

## 2019-09-04 NOTE — Telephone Encounter (Signed)
Med check 07/01/19

## 2019-09-06 ENCOUNTER — Encounter (HOSPITAL_COMMUNITY): Payer: BLUE CROSS/BLUE SHIELD | Admitting: Occupational Therapy

## 2019-09-06 ENCOUNTER — Encounter (HOSPITAL_COMMUNITY): Payer: BLUE CROSS/BLUE SHIELD | Admitting: Physical Therapy

## 2019-09-08 ENCOUNTER — Encounter (HOSPITAL_COMMUNITY): Payer: BLUE CROSS/BLUE SHIELD

## 2019-09-11 ENCOUNTER — Encounter (HOSPITAL_COMMUNITY): Payer: BLUE CROSS/BLUE SHIELD | Admitting: Occupational Therapy

## 2019-09-11 ENCOUNTER — Encounter (HOSPITAL_COMMUNITY): Payer: BLUE CROSS/BLUE SHIELD | Admitting: Physical Therapy

## 2019-09-13 ENCOUNTER — Encounter (HOSPITAL_COMMUNITY): Payer: BLUE CROSS/BLUE SHIELD | Admitting: Occupational Therapy

## 2019-09-13 ENCOUNTER — Encounter (HOSPITAL_COMMUNITY): Payer: BLUE CROSS/BLUE SHIELD | Admitting: Physical Therapy

## 2019-09-18 ENCOUNTER — Encounter (HOSPITAL_COMMUNITY): Payer: BLUE CROSS/BLUE SHIELD | Admitting: Occupational Therapy

## 2019-09-18 ENCOUNTER — Encounter (HOSPITAL_COMMUNITY): Payer: BLUE CROSS/BLUE SHIELD | Admitting: Physical Therapy

## 2019-09-20 ENCOUNTER — Encounter (HOSPITAL_COMMUNITY): Payer: BLUE CROSS/BLUE SHIELD | Admitting: Occupational Therapy

## 2019-09-20 ENCOUNTER — Encounter (HOSPITAL_COMMUNITY): Payer: BLUE CROSS/BLUE SHIELD

## 2019-09-21 ENCOUNTER — Encounter (HOSPITAL_COMMUNITY): Payer: BLUE CROSS/BLUE SHIELD

## 2019-09-25 ENCOUNTER — Encounter (HOSPITAL_COMMUNITY): Payer: BLUE CROSS/BLUE SHIELD

## 2019-09-25 ENCOUNTER — Encounter (HOSPITAL_COMMUNITY): Payer: BLUE CROSS/BLUE SHIELD | Admitting: Physical Therapy

## 2019-09-27 ENCOUNTER — Encounter (HOSPITAL_COMMUNITY): Payer: BLUE CROSS/BLUE SHIELD | Admitting: Occupational Therapy

## 2019-09-27 ENCOUNTER — Encounter (HOSPITAL_COMMUNITY): Payer: BLUE CROSS/BLUE SHIELD

## 2019-09-29 ENCOUNTER — Other Ambulatory Visit: Payer: Self-pay

## 2019-09-29 ENCOUNTER — Encounter (HOSPITAL_COMMUNITY): Payer: BLUE CROSS/BLUE SHIELD

## 2019-09-29 ENCOUNTER — Encounter (HOSPITAL_COMMUNITY): Payer: BLUE CROSS/BLUE SHIELD | Admitting: Occupational Therapy

## 2019-09-29 ENCOUNTER — Ambulatory Visit (INDEPENDENT_AMBULATORY_CARE_PROVIDER_SITE_OTHER): Payer: BC Managed Care – PPO | Admitting: Family Medicine

## 2019-09-29 DIAGNOSIS — J683 Other acute and subacute respiratory conditions due to chemicals, gases, fumes and vapors: Secondary | ICD-10-CM

## 2019-09-29 DIAGNOSIS — G4700 Insomnia, unspecified: Secondary | ICD-10-CM

## 2019-09-29 DIAGNOSIS — R05 Cough: Secondary | ICD-10-CM | POA: Diagnosis not present

## 2019-09-29 MED ORDER — LORAZEPAM 1 MG PO TABS: ORAL_TABLET | ORAL | 1 refills | Status: DC

## 2019-09-29 NOTE — Progress Notes (Signed)
   Subjective:  Audio only  [name], male    DOB: January 19, 1961, 59 y.o.   MRN: [id number]  HPI pt had covid around thanksgiving and since then he feels like he cannot get enough air sometimes when lying down at night. Appetite is not as good as it was before covid but he is eating good. Feels hot off and on.   Virtual Visit via Telephone Note  I connected with Rozanna Box on 09/29/19 at 10:00 AM EST by telephone and verified that I am speaking with the correct person using two identifiers.  Location: Patient: home Provider: office   I discussed the limitations, risks, security and privacy concerns of performing an evaluation and management service by telephone and the availability of in person appointments. I also discussed with the patient that there may be a patient responsible charge related to this service. The patient expressed understanding and agreed to proceed.   History of Present Illness:    Observations/Objective:   Assessment and Plan:   Follow Up Instructions:    I discussed the assessment and treatment plan with the patient. The patient was provided an opportunity to ask questions and all were answered. The patient agreed with the plan and demonstrated an understanding of the instructions.   The patient was advised to call back or seek an in-person evaluation if the symptoms worsen or if the condition fails to improve as anticipated.  I provided 18 minutes of non-face-to-face time during this encounter.  Ongoing challenges with cough.  Often has a wheezy texture.  Often wakes up in the middle the night with it.  No fever or chills.  Appetite improving.  Uses the albuterol in the morning but not afterwards  Also notes increased level insomnia and elements of anxiety at night.  Would like to have something on hand to take at night as needed for anxiety and insomnia     Review of Systems No headache no chest pain no fever    Objective:   Physical Exam   Virtual     Assessment & Plan:  Impression post Covid insomnia and post Covid reactive airways.  Encouraged to use albuterol at nighttime.  As needed benzodiazepine prescribed.  Proper use discussed.  Potential side effects discussed.  Warning signs discussed

## 2019-10-02 ENCOUNTER — Encounter (HOSPITAL_COMMUNITY): Payer: BLUE CROSS/BLUE SHIELD | Admitting: Physical Therapy

## 2019-10-02 ENCOUNTER — Encounter (HOSPITAL_COMMUNITY): Payer: BLUE CROSS/BLUE SHIELD

## 2019-10-04 ENCOUNTER — Encounter (HOSPITAL_COMMUNITY): Payer: BLUE CROSS/BLUE SHIELD

## 2019-10-04 ENCOUNTER — Encounter (HOSPITAL_COMMUNITY): Payer: BLUE CROSS/BLUE SHIELD | Admitting: Occupational Therapy

## 2019-10-05 ENCOUNTER — Telehealth (HOSPITAL_COMMUNITY): Payer: Self-pay | Admitting: Physical Therapy

## 2019-10-05 NOTE — Telephone Encounter (Signed)
Pt surgery was postponed. Pt wants to cancel this appt until he has another surgery date

## 2019-10-05 NOTE — Telephone Encounter (Deleted)
pt cancelled appt for tomorrow. no reason given 

## 2019-10-06 ENCOUNTER — Encounter (HOSPITAL_COMMUNITY): Payer: BLUE CROSS/BLUE SHIELD

## 2019-10-06 ENCOUNTER — Encounter (HOSPITAL_COMMUNITY): Payer: BLUE CROSS/BLUE SHIELD | Admitting: Occupational Therapy

## 2019-10-09 ENCOUNTER — Encounter (HOSPITAL_COMMUNITY): Payer: BLUE CROSS/BLUE SHIELD

## 2019-10-09 ENCOUNTER — Encounter (HOSPITAL_COMMUNITY): Payer: BLUE CROSS/BLUE SHIELD | Admitting: Physical Therapy

## 2019-10-11 ENCOUNTER — Encounter (HOSPITAL_COMMUNITY): Payer: BLUE CROSS/BLUE SHIELD

## 2019-10-11 ENCOUNTER — Encounter (HOSPITAL_COMMUNITY): Payer: BLUE CROSS/BLUE SHIELD | Admitting: Occupational Therapy

## 2019-10-13 ENCOUNTER — Encounter (HOSPITAL_COMMUNITY): Payer: BLUE CROSS/BLUE SHIELD | Admitting: Occupational Therapy

## 2019-10-13 ENCOUNTER — Encounter (HOSPITAL_COMMUNITY): Payer: BLUE CROSS/BLUE SHIELD

## 2019-10-16 ENCOUNTER — Inpatient Hospital Stay (HOSPITAL_COMMUNITY)
Admission: RE | Admit: 2019-10-16 | Payer: BC Managed Care – PPO | Source: Home / Self Care | Admitting: Orthopedic Surgery

## 2019-10-16 ENCOUNTER — Encounter (HOSPITAL_COMMUNITY): Admission: RE | Payer: Self-pay | Source: Home / Self Care

## 2019-10-16 ENCOUNTER — Encounter (HOSPITAL_COMMUNITY): Payer: BLUE CROSS/BLUE SHIELD | Admitting: Physical Therapy

## 2019-10-16 ENCOUNTER — Encounter (HOSPITAL_COMMUNITY): Payer: BLUE CROSS/BLUE SHIELD

## 2019-10-16 SURGERY — ARTHROPLASTY, KNEE, TOTAL
Anesthesia: Choice | Site: Knee | Laterality: Left

## 2019-10-18 ENCOUNTER — Encounter (HOSPITAL_COMMUNITY): Payer: BLUE CROSS/BLUE SHIELD

## 2019-10-20 ENCOUNTER — Encounter (HOSPITAL_COMMUNITY): Payer: BLUE CROSS/BLUE SHIELD | Admitting: Occupational Therapy

## 2019-10-20 ENCOUNTER — Encounter (HOSPITAL_COMMUNITY): Payer: BLUE CROSS/BLUE SHIELD

## 2019-10-20 DIAGNOSIS — B351 Tinea unguium: Secondary | ICD-10-CM | POA: Diagnosis not present

## 2019-10-23 ENCOUNTER — Ambulatory Visit (HOSPITAL_COMMUNITY): Payer: BC Managed Care – PPO | Admitting: Physical Therapy

## 2019-10-25 ENCOUNTER — Encounter: Payer: Self-pay | Admitting: Family Medicine

## 2019-11-08 ENCOUNTER — Ambulatory Visit (INDEPENDENT_AMBULATORY_CARE_PROVIDER_SITE_OTHER): Payer: BC Managed Care – PPO | Admitting: Family Medicine

## 2019-11-08 ENCOUNTER — Other Ambulatory Visit: Payer: Self-pay

## 2019-11-08 VITALS — BP 138/88 | Temp 97.6°F | Wt 195.6 lb

## 2019-11-08 DIAGNOSIS — Z125 Encounter for screening for malignant neoplasm of prostate: Secondary | ICD-10-CM

## 2019-11-08 DIAGNOSIS — Z79899 Other long term (current) drug therapy: Secondary | ICD-10-CM

## 2019-11-08 DIAGNOSIS — I10 Essential (primary) hypertension: Secondary | ICD-10-CM | POA: Diagnosis not present

## 2019-11-08 DIAGNOSIS — E119 Type 2 diabetes mellitus without complications: Secondary | ICD-10-CM | POA: Diagnosis not present

## 2019-11-08 DIAGNOSIS — F411 Generalized anxiety disorder: Secondary | ICD-10-CM

## 2019-11-08 DIAGNOSIS — E785 Hyperlipidemia, unspecified: Secondary | ICD-10-CM | POA: Diagnosis not present

## 2019-11-08 DIAGNOSIS — D649 Anemia, unspecified: Secondary | ICD-10-CM

## 2019-11-08 MED ORDER — METFORMIN HCL 500 MG PO TABS: 1000.0000 mg | ORAL_TABLET | Freq: Two times a day (BID) | ORAL | 5 refills | Status: DC

## 2019-11-08 MED ORDER — DUTASTERIDE 0.5 MG PO CAPS: ORAL_CAPSULE | ORAL | 5 refills | Status: DC

## 2019-11-08 MED ORDER — ESCITALOPRAM OXALATE 10 MG PO TABS: ORAL_TABLET | ORAL | 5 refills | Status: DC

## 2019-11-08 MED ORDER — OMEPRAZOLE 20 MG PO CPDR: DELAYED_RELEASE_CAPSULE | ORAL | 5 refills | Status: DC

## 2019-11-08 MED ORDER — TAMSULOSIN HCL 0.4 MG PO CAPS: 0.8000 mg | ORAL_CAPSULE | Freq: Every day | ORAL | 5 refills | Status: DC

## 2019-11-08 MED ORDER — LORAZEPAM 1 MG PO TABS: ORAL_TABLET | ORAL | 5 refills | Status: DC

## 2019-11-08 MED ORDER — GLIPIZIDE 5 MG PO TABS: ORAL_TABLET | ORAL | 5 refills | Status: DC

## 2019-11-08 MED ORDER — ATORVASTATIN CALCIUM 20 MG PO TABS: 20.0000 mg | ORAL_TABLET | Freq: Every day | ORAL | 5 refills | Status: DC

## 2019-11-08 MED ORDER — LOSARTAN POTASSIUM 50 MG PO TABS: 50.0000 mg | ORAL_TABLET | Freq: Every day | ORAL | 5 refills | Status: DC

## 2019-11-08 NOTE — Progress Notes (Signed)
   Subjective:    [name], male    DOB: [birth year], 59 y.o.   MRN: [id number]  HPI Pt here for surgical clearance. Pt is having total knee replacement. Suppose to have surgery on March 27.   Pt also wanting to discuss COVID with provider. Pt has COVID back in November. Pt is having lingering "panic attacks". Pt is wanting to know if he should get anxiety under control before surgery.   Having sig insomnia  usin the benzo each night  Patient claims compliance with diabetes medication. No obvious side effects. Reports no substantial low sugar spells. Most numbers are generally in good range when checked fasting. Generally does not miss a dose of medication. Watching diabetic diet closely  Blood pressure medicine and blood pressure levels reviewed today with patient. Compliant with blood pressure medicine. States does not miss a dose. No obvious side effects. Blood pressure generally good when checked elsewhere. Watching salt intake.   Patient continues to take lipid medication regularly. No obvious side effects from it. Generally does not miss a dose. Prior blood work results are reviewed with patient. Patient continues to work on fat intake in diet    Feeling anxious at times.  Anxiety definitely becoming more of the challenge.  Affects him every single day.  Starting to impact on his ability to do things he wants to do or needs to do.  Patient reports this is all Edmon Crape since his COVID-19 infection.  Brings in a list of medicines today wondering if it might help him including hydroxychloroquine and Endo-Mectin  Review of Systems No headache, no major weight loss or weight gain, no chest pain no back pain abdominal pain no change in bowel habits complete ROS otherwise negative     Objective:   Physical Exam Alert and oriented, vitals reviewed and stable, NAD ENT-TM's and ext canals WNL bilat via otoscopic exam Soft palate, tonsils and post pharynx WNL via  oropharyngeal exam Neck-symmetric, no masses; thyroid nonpalpable and nontender Pulmonary-no tachypnea or accessory muscle use; Clear without wheezes via auscultation Card--no abnrml murmurs, rhythm reg and rate WNL Carotid pulses symmetric, without bruits        Assessment & Plan:  Impression 1 hypertension good control discussed maintain same meds  2.  Generalized anxiety disorder.  Substantial.  Discussed will initiate Lexapro rationale discussed  3.  Type 2 diabetes status uncertain check A1c  4.  Planning to have orthopedic surgery shortly.  Patient should be able to handle well  5.  Reactive airways post Covid clinically much improved  Follow-up in 3 months for physical.  Appropriate blood work.  We will fill out presurgical form.

## 2019-11-13 DIAGNOSIS — E119 Type 2 diabetes mellitus without complications: Secondary | ICD-10-CM | POA: Diagnosis not present

## 2019-11-13 DIAGNOSIS — Z125 Encounter for screening for malignant neoplasm of prostate: Secondary | ICD-10-CM | POA: Diagnosis not present

## 2019-11-13 DIAGNOSIS — E785 Hyperlipidemia, unspecified: Secondary | ICD-10-CM | POA: Diagnosis not present

## 2019-11-13 DIAGNOSIS — Z79899 Other long term (current) drug therapy: Secondary | ICD-10-CM | POA: Diagnosis not present

## 2019-11-13 DIAGNOSIS — I10 Essential (primary) hypertension: Secondary | ICD-10-CM | POA: Diagnosis not present

## 2019-11-14 LAB — CBC WITH DIFFERENTIAL/PLATELET
Basophils Absolute: 0 10*3/uL (ref 0.0–0.2)
Basos: 1 %
EOS (ABSOLUTE): 0.1 10*3/uL (ref 0.0–0.4)
Eos: 1 %
Hematocrit: 38.4 % (ref 37.5–51.0)
Hemoglobin: 11.9 g/dL — ABNORMAL LOW (ref 13.0–17.7)
Immature Grans (Abs): 0 10*3/uL (ref 0.0–0.1)
Immature Granulocytes: 0 %
Lymphocytes Absolute: 1.7 10*3/uL (ref 0.7–3.1)
Lymphs: 25 %
MCH: 24.9 pg — ABNORMAL LOW (ref 26.6–33.0)
MCHC: 31 g/dL — ABNORMAL LOW (ref 31.5–35.7)
MCV: 80 fL (ref 79–97)
Monocytes Absolute: 0.5 10*3/uL (ref 0.1–0.9)
Monocytes: 7 %
Neutrophils Absolute: 4.3 10*3/uL (ref 1.4–7.0)
Neutrophils: 66 %
Platelets: 215 10*3/uL (ref 150–450)
RBC: 4.78 x10E6/uL (ref 4.14–5.80)
RDW: 14.5 % (ref 11.6–15.4)
WBC: 6.6 10*3/uL (ref 3.4–10.8)

## 2019-11-14 LAB — BASIC METABOLIC PANEL
BUN/Creatinine Ratio: 14 (ref 10–24)
BUN: 13 mg/dL (ref 8–27)
CO2: 23 mmol/L (ref 20–29)
Calcium: 10.2 mg/dL (ref 8.6–10.2)
Chloride: 103 mmol/L (ref 96–106)
Creatinine, Ser: 0.91 mg/dL (ref 0.76–1.27)
GFR calc Af Amer: 106 mL/min/{1.73_m2} (ref >59)
GFR calc non Af Amer: 91 mL/min/{1.73_m2} (ref >59)
Glucose: 135 mg/dL — ABNORMAL HIGH (ref 65–99)
Potassium: 4.4 mmol/L (ref 3.5–5.2)
Sodium: 141 mmol/L (ref 134–144)

## 2019-11-14 LAB — HEPATIC FUNCTION PANEL
ALT: 12 IU/L (ref 0–44)
AST: 14 IU/L (ref 0–40)
Albumin: 4.1 g/dL (ref 3.8–4.9)
Alkaline Phosphatase: 67 IU/L (ref 39–117)
Bilirubin Total: 0.8 mg/dL (ref 0.0–1.2)
Bilirubin, Direct: 0.26 mg/dL (ref 0.00–0.40)
Total Protein: 6.4 g/dL (ref 6.0–8.5)

## 2019-11-14 LAB — MICROALBUMIN / CREATININE URINE RATIO
Creatinine, Urine: 206.5 mg/dL
Microalb/Creat Ratio: 6 mg/g creat (ref 0–29)
Microalbumin, Urine: 11.7 ug/mL

## 2019-11-14 LAB — HEMOGLOBIN A1C
Est. average glucose Bld gHb Est-mCnc: 134 mg/dL
Hgb A1c MFr Bld: 6.3 % — ABNORMAL HIGH (ref 4.8–5.6)

## 2019-11-14 LAB — LIPID PANEL
Chol/HDL Ratio: 2.8 ratio (ref 0.0–5.0)
Cholesterol, Total: 105 mg/dL (ref 100–199)
HDL: 38 mg/dL — ABNORMAL LOW (ref >39)
LDL Chol Calc (NIH): 45 mg/dL (ref 0–99)
Triglycerides: 121 mg/dL (ref 0–149)
VLDL Cholesterol Cal: 22 mg/dL (ref 5–40)

## 2019-11-14 LAB — PSA: Prostate Specific Ag, Serum: 0.3 ng/mL (ref 0.0–4.0)

## 2019-11-16 ENCOUNTER — Telehealth: Payer: Self-pay | Admitting: *Deleted

## 2019-11-16 NOTE — Telephone Encounter (Signed)
Pt has completed bw and his preoperative risk evaluation form is in dr steve's folder for review.

## 2019-11-19 ENCOUNTER — Encounter: Payer: Self-pay | Admitting: Family Medicine

## 2019-11-22 DIAGNOSIS — D649 Anemia, unspecified: Secondary | ICD-10-CM | POA: Diagnosis not present

## 2019-11-22 NOTE — Telephone Encounter (Signed)
done

## 2019-11-22 NOTE — Addendum Note (Signed)
Addended by: Margaretha Sheffield on: 11/22/2019 09:55 AM   Modules accepted: Orders

## 2019-11-23 LAB — IRON AND TIBC
Iron Saturation: 10 % — ABNORMAL LOW (ref 15–55)
Iron: 38 ug/dL (ref 38–169)
Total Iron Binding Capacity: 370 ug/dL (ref 250–450)
UIBC: 332 ug/dL (ref 111–343)

## 2019-11-23 LAB — FERRITIN: Ferritin: 10 ng/mL — ABNORMAL LOW (ref 30–400)

## 2019-11-27 ENCOUNTER — Other Ambulatory Visit: Payer: Self-pay | Admitting: Family Medicine

## 2019-11-27 DIAGNOSIS — D649 Anemia, unspecified: Secondary | ICD-10-CM

## 2019-11-28 ENCOUNTER — Encounter: Payer: Self-pay | Admitting: Family Medicine

## 2019-11-28 ENCOUNTER — Other Ambulatory Visit: Payer: Self-pay | Admitting: Family Medicine

## 2019-11-28 DIAGNOSIS — D649 Anemia, unspecified: Secondary | ICD-10-CM

## 2019-11-30 ENCOUNTER — Encounter (HOSPITAL_COMMUNITY): Payer: Self-pay

## 2019-11-30 NOTE — Patient Instructions (Addendum)
DUE TO COVID-19 ONLY ONE VISITOR IS ALLOWED TO COME WITH YOU AND STAY IN THE WAITING ROOM ONLY DURING PRE OP AND PROCEDURE. THE ONE VISITOR MAY VISIT WITH YOU IN YOUR PRIVATE ROOM DURING VISITING HOURS ONLY!!   COVID SWAB TESTING MUST BE COMPLETED ON: Thursday, December 07, 2019 at  4:35 PM       [address], [place], [place] - the short stay covered drive at [facility] (Use the [employer] entrance to [facility] next to [facility]) (Must self quarantine after testing. Follow instructions on handout.)       Your procedure is scheduled on: Monday, December 11, 2019   Report to [facility] Main  Entrance   Report to Short Stay at 5:30 AM   [facility])   Call this number if you have problems the morning of surgery [phone]   Do not eat food :After Midnight.   May have liquids until 4:15 AM day of surgery   CLEAR LIQUID DIET  Foods Allowed                                                                     Foods Excluded  Water, Black Coffee and tea, regular and decaf                             liquids that you cannot  Plain Jell-O in any flavor  (No red)                                           see through such as: Fruit ices (not with fruit pulp)                                     milk, soups, orange juice  Iced Popsicles (No red)                                    All solid food Carbonated beverages, regular and diet                                    Apple juices Sports drinks like Gatorade (No red) Lightly seasoned clear broth or consume(fat free) Sugar, honey syrup  Sample Menu Breakfast                                Lunch                                     Supper Cranberry juice                    Beef broth  Chicken broth Jell-O                                     Grape juice                           Apple juice Coffee or tea                        Jell-O                                      Popsicle                                                 Coffee or tea                        Coffee or tea   Complete one G2 drink the morning of surgery at 4:15AM the day of surgery.   Oral Hygiene is also important to reduce your risk of infection.                                    Remember - BRUSH YOUR TEETH THE MORNING OF SURGERY WITH YOUR REGULAR TOOTHPASTE   Do NOT smoke after Midnight   Take these medicines the morning of surgery with A SIP OF WATER: Escitalopram, Omeprazole   DO NOT TAKE SILDENAFIL 24 HOURS PRIOR TO SURGERY   Bring Asthma Inhaler day of surgery  DO NOT TAKE ANY DIABETIC MEDICATIONS DAY OF YOUR SURGERY                               You may not have any metal on your body including jewelry, and body piercings             Do not wear lotions, powders, perfumes/cologne, or deodorant                           Men may shave face and neck.   Do not bring valuables to the hospital. [facility] IS NOT             RESPONSIBLE   FOR VALUABLES.   Contacts, dentures or bridgework may not be worn into surgery.   Bring small overnight bag day of surgery.    Patients discharged the day of surgery will not be allowed to drive home.   Name and phone number of your driver:   Special Instructions: Bring a copy of your healthcare power of attorney and living will documents         the day of surgery if you haven't scanned them in before.              Please read over the following fact sheets you were given:  How to Manage Your Diabetes Before and After Surgery  Why is it important to control my blood sugar before and after surgery? . Improving blood sugar  levels before and after surgery helps healing and can limit problems. . A way of improving blood sugar control is eating a healthy diet by: o  Eating less sugar and carbohydrates o  Increasing activity/exercise o  Talking with your doctor about reaching your blood sugar goals . High blood sugars (greater than 180 mg/dL) can  raise your risk of infections and slow your recovery, so you will need to focus on controlling your diabetes during the weeks before surgery. . Make sure that the doctor who takes care of your diabetes knows about your planned surgery including the date and location.  How do I manage my blood sugar before surgery? . Check your blood sugar at least 4 times a day, starting 2 days before surgery, to make sure that the level is not too high or low. o Check your blood sugar the morning of your surgery when you wake up and every 2 hours until you get to the Short Stay unit. . If your blood sugar is less than 70 mg/dL, you will need to treat for low blood sugar: o Do not take insulin. o Treat a low blood sugar (less than 70 mg/dL) with  cup of clear juice (cranberry or apple), 4 glucose tablets, OR glucose gel. o Recheck blood sugar in 15 minutes after treatment (to make sure it is greater than 70 mg/dL). If your blood sugar is not greater than 70 mg/dL on recheck, call [phone] for further instructions. . Report your blood sugar to the short stay nurse when you get to Short Stay.  . If you are admitted to the hospital after surgery: o Your blood sugar will be checked by the staff and you will probably be given insulin after surgery (instead of oral diabetes medicines) to make sure you have good blood sugar levels. o The goal for blood sugar control after surgery is 80-180 mg/dL.   WHAT DO I DO ABOUT MY DIABETES MEDICATION?  Marland Kitchen Do not take oral diabetes medicines (pills) the morning of surgery.  . THE DAY BEFORE SURGERY, take GLIPIZIDE IN THE MORNING OR AT LUNCH ONLY, DO NOT TAKE AN EVENING DOSE       . THE DAY BEFORE SURGERY, take METFORMIN PER NORMAL ROUTINE.   Reviewed and Endorsed by [facility] Patient Education Committee, August 2015  [facility] - Preparing for Surgery Before surgery, you can play an important role.  Because skin is not sterile, your skin needs to be as free of  germs as possible.  You can reduce the number of germs on your skin by washing with CHG (chlorahexidine gluconate) soap before surgery.  CHG is an antiseptic cleaner which kills germs and bonds with the skin to continue killing germs even after washing. Please DO NOT use if you have an allergy to CHG or antibacterial soaps.  If your skin becomes reddened/irritated stop using the CHG and inform your nurse when you arrive at Short Stay. Do not shave (including legs and underarms) for at least 48 hours prior to the first CHG shower.  You may shave your face/neck.  Please follow these instructions carefully:  1.  Shower with CHG Soap the night before surgery and the  morning of surgery.  2.  If you choose to wash your hair, wash your hair first as usual with your normal  shampoo.  3.  After you shampoo, rinse your hair and body thoroughly to remove the shampoo.  4.  Use CHG as you would any other liquid soap.  You can apply chg directly to the skin and wash.  Gently with a scrungie or clean washcloth.  5.  Apply the CHG Soap to your body ONLY FROM THE NECK DOWN.   Do   not use on face/ open                           Wound or open sores. Avoid contact with eyes, ears mouth and   genitals (private parts).                       Wash face,  Genitals (private parts) with your normal soap.             6.  Wash thoroughly, paying special attention to the area where your    surgery  will be performed.  7.  Thoroughly rinse your body with warm water from the neck down.  8.  DO NOT shower/wash with your normal soap after using and rinsing off the CHG Soap.                9.  Pat yourself dry with a clean towel.            10.  Wear clean pajamas.            11.  Place clean sheets on your bed the night of your first shower and do not  sleep with pets. Day of Surgery : Do not apply any lotions/deodorants the morning of surgery.  Please wear clean clothes to the hospital/surgery  center.  FAILURE TO FOLLOW THESE INSTRUCTIONS MAY RESULT IN THE CANCELLATION OF YOUR SURGERY  PATIENT SIGNATURE_________________________________  NURSE SIGNATURE__________________________________  ________________________________________________________________________   Adam Phenix  An incentive spirometer is a tool that can help keep your lungs clear and active. This tool measures how well you are filling your lungs with each breath. Taking long deep breaths may help reverse or decrease the chance of developing breathing (pulmonary) problems (especially infection) following:  A long period of time when you are unable to move or be active. BEFORE THE PROCEDURE   If the spirometer includes an indicator to show your best effort, your nurse or respiratory therapist will set it to a desired goal.  If possible, sit up straight or lean slightly forward. Try not to slouch.  Hold the incentive spirometer in an upright position. INSTRUCTIONS FOR USE  1. Sit on the edge of your bed if possible, or sit up as far as you can in bed or on a chair. 2. Hold the incentive spirometer in an upright position. 3. Breathe out normally. 4. Place the mouthpiece in your mouth and seal your lips tightly around it. 5. Breathe in slowly and as deeply as possible, raising the piston or the ball toward the top of the column. 6. Hold your breath for 3-5 seconds or for as long as possible. Allow the piston or ball to fall to the bottom of the column. 7. Remove the mouthpiece from your mouth and breathe out normally. 8. Rest for a few seconds and repeat Steps 1 through 7 at least 10 times every 1-2 hours when you are awake. Take your time and take a few normal breaths between deep breaths. 9. The spirometer may include an indicator to show your best effort. Use the indicator as a goal to work toward during each repetition.  10. After each set of 10 deep breaths, practice coughing to be sure your lungs are  clear. If you have an incision (the cut made at the time of surgery), support your incision when coughing by placing a pillow or rolled up towels firmly against it. Once you are able to get out of bed, walk around indoors and cough well. You may stop using the incentive spirometer when instructed by your caregiver.  RISKS AND COMPLICATIONS  Take your time so you do not get dizzy or light-headed.  If you are in pain, you may need to take or ask for pain medication before doing incentive spirometry. It is harder to take a deep breath if you are having pain. AFTER USE  Rest and breathe slowly and easily.  It can be helpful to keep track of a log of your progress. Your caregiver can provide you with a simple table to help with this. If you are using the spirometer at home, follow these instructions: [place] IF:   You are having difficultly using the spirometer.  You have trouble using the spirometer as often as instructed.  Your pain medication is not giving enough relief while using the spirometer.  You develop fever of 100.5 F (38.1 C) or higher. SEEK IMMEDIATE MEDICAL CARE IF:   You cough up bloody sputum that had not been present before.  You develop fever of 102 F (38.9 C) or greater.  You develop worsening pain at or near the incision site. MAKE SURE YOU:   Understand these instructions.  Will watch your condition.  Will get help right away if you are not doing well or get worse. Document Released: 01/18/2007 Document Revised: 11/30/2011 Document Reviewed: 03/21/2007 Exit[employer] Patient Information 2014 [employer], [place].   ________________________________________________________________________  WHAT IS A BLOOD TRANSFUSION? Blood Transfusion Information  A transfusion is the replacement of blood or some of its parts. Blood is made up of multiple cells which provide different functions.  Red blood cells carry oxygen and are used for blood loss  replacement.  White blood cells fight against infection.  Platelets control bleeding.  Plasma helps clot blood.  Other blood products are available for specialized needs, such as hemophilia or other clotting disorders. BEFORE THE TRANSFUSION  Who gives blood for transfusions?   Healthy volunteers who are fully evaluated to make sure their blood is safe. This is blood bank blood. Transfusion therapy is the safest it has ever been in the practice of medicine. Before blood is taken from a donor, a complete history is taken to make sure that person has no history of diseases nor engages in risky social behavior (examples are intravenous drug use or sexual activity with multiple partners). The donor's travel history is screened to minimize risk of transmitting infections, such as malaria. The donated blood is tested for signs of infectious diseases, such as HIV and hepatitis. The blood is then tested to be sure it is compatible with you in order to minimize the chance of a transfusion reaction. If you or a relative donates blood, this is often done in anticipation of surgery and is not appropriate for emergency situations. It takes many days to process the donated blood. RISKS AND COMPLICATIONS Although transfusion therapy is very safe and saves many lives, the main dangers of transfusion include:   Getting an infectious disease.  Developing a transfusion reaction. This is an allergic reaction to something in the blood you were given. Every precaution is taken to prevent this. The  decision to have a blood transfusion has been considered carefully by your caregiver before blood is given. Blood is not given unless the benefits outweigh the risks. AFTER THE TRANSFUSION  Right after receiving a blood transfusion, you will usually feel much better and more energetic. This is especially true if your red blood cells have gotten low (anemic). The transfusion raises the level of the red blood cells which  carry oxygen, and this usually causes an energy increase.  The nurse administering the transfusion will monitor you carefully for complications. HOME CARE INSTRUCTIONS  No special instructions are needed after a transfusion. You may find your energy is better. Speak with your caregiver about any limitations on activity for underlying diseases you may have. SEEK MEDICAL CARE IF:   Your condition is not improving after your transfusion.  You develop redness or irritation at the intravenous (IV) site. SEEK IMMEDIATE MEDICAL CARE IF:  Any of the following symptoms occur over the next 12 hours:  Shaking chills.  You have a temperature by mouth above 102 F (38.9 C), not controlled by medicine.  Chest, back, or muscle pain.  People around you feel you are not acting correctly or are confused.  Shortness of breath or difficulty breathing.  Dizziness and fainting.  You get a rash or develop hives.  You have a decrease in urine output.  Your urine turns a dark color or changes to pink, red, or brown. Any of the following symptoms occur over the next 10 days:  You have a temperature by mouth above 102 F (38.9 C), not controlled by medicine.  Shortness of breath.  Weakness after normal activity.  The white part of the eye turns yellow (jaundice).  You have a decrease in the amount of urine or are urinating less often.  Your urine turns a dark color or changes to pink, red, or brown. Document Released: 09/04/2000 Document Revised: 11/30/2011 Document Reviewed: 04/23/2008 [facility] Patient Information 2014 [place], [place].  _______________________________________________________________________

## 2019-12-01 NOTE — Telephone Encounter (Signed)
[name] Drought, do you know if pt got form. If so let me know so I can close this message. thanks

## 2019-12-04 ENCOUNTER — Other Ambulatory Visit: Payer: Self-pay

## 2019-12-04 ENCOUNTER — Encounter (HOSPITAL_COMMUNITY)
Admission: RE | Admit: 2019-12-04 | Discharge: 2019-12-04 | Disposition: A | Discharge status: Home / Self Care | Payer: BC Managed Care – PPO | Source: Ambulatory Visit | Attending: Orthopedic Surgery | Admitting: Orthopedic Surgery

## 2019-12-04 ENCOUNTER — Encounter (HOSPITAL_COMMUNITY): Payer: Self-pay

## 2019-12-04 DIAGNOSIS — Z01818 Encounter for other preprocedural examination: Secondary | ICD-10-CM | POA: Insufficient documentation

## 2019-12-04 DIAGNOSIS — R9431 Abnormal electrocardiogram [ECG] [EKG]: Secondary | ICD-10-CM | POA: Insufficient documentation

## 2019-12-04 HISTORY — DX: Anesthesia of skin: R20.2

## 2019-12-04 HISTORY — DX: Male erectile dysfunction, unspecified: N52.9

## 2019-12-04 HISTORY — DX: Hyperlipidemia, unspecified: E78.5

## 2019-12-04 HISTORY — DX: Personal history of other diseases of the digestive system: Z87.19

## 2019-12-04 HISTORY — DX: Unspecified osteoarthritis, unspecified site: M19.90

## 2019-12-04 HISTORY — DX: Anesthesia of skin: R20.0

## 2019-12-04 HISTORY — DX: Abnormal level of blood mineral: R79.0

## 2019-12-04 HISTORY — DX: Personal history of other diseases of the nervous system and sense organs: Z86.69

## 2019-12-04 HISTORY — DX: Fatty (change of) liver, not elsewhere classified: K76.0

## 2019-12-04 HISTORY — DX: Essential (primary) hypertension: I10

## 2019-12-04 HISTORY — DX: Insomnia, unspecified: G47.00

## 2019-12-04 HISTORY — DX: Diverticulosis of intestine, part unspecified, without perforation or abscess without bleeding: K57.90

## 2019-12-04 HISTORY — DX: Gastro-esophageal reflux disease without esophagitis: K21.9

## 2019-12-04 HISTORY — DX: Type 2 diabetes mellitus without complications: E11.9

## 2019-12-04 HISTORY — DX: Anxiety disorder, unspecified: F41.9

## 2019-12-04 LAB — COMPREHENSIVE METABOLIC PANEL
ALT: 16 U/L (ref 0–44)
AST: 15 U/L (ref 15–41)
Albumin: 3.7 g/dL (ref 3.5–5.0)
Alkaline Phosphatase: 52 U/L (ref 38–126)
Anion gap: 11 (ref 5–15)
BUN: 14 mg/dL (ref 6–20)
CO2: 27 mmol/L (ref 22–32)
Calcium: 9.7 mg/dL (ref 8.9–10.3)
Chloride: 102 mmol/L (ref 98–111)
Creatinine, Ser: 0.98 mg/dL (ref 0.61–1.24)
GFR calc Af Amer: >60 mL/min (ref >60)
GFR calc non Af Amer: >60 mL/min (ref >60)
Glucose, Bld: 188 mg/dL — ABNORMAL HIGH (ref 70–99)
Potassium: 3.7 mmol/L (ref 3.5–5.1)
Sodium: 140 mmol/L (ref 135–145)
Total Bilirubin: 0.9 mg/dL (ref 0.3–1.2)
Total Protein: 6.4 g/dL — ABNORMAL LOW (ref 6.5–8.1)

## 2019-12-04 LAB — APTT: aPTT: 35 seconds (ref 24–36)

## 2019-12-04 LAB — SURGICAL PCR SCREEN
MRSA, PCR: NEGATIVE
Staphylococcus aureus: NEGATIVE

## 2019-12-04 LAB — PROTIME-INR
INR: 1.2 (ref 0.8–1.2)
Prothrombin Time: 14.6 seconds (ref 11.4–15.2)

## 2019-12-04 LAB — CBC
HCT: 39.2 % (ref 39.0–52.0)
Hemoglobin: 11.9 g/dL — ABNORMAL LOW (ref 13.0–17.0)
MCH: 24.9 pg — ABNORMAL LOW (ref 26.0–34.0)
MCHC: 30.4 g/dL (ref 30.0–36.0)
MCV: 82.2 fL (ref 80.0–100.0)
Platelets: 196 10*3/uL (ref 150–400)
RBC: 4.77 MIL/uL (ref 4.22–5.81)
RDW: 15.1 % (ref 11.5–15.5)
WBC: 8.2 10*3/uL (ref 4.0–10.5)
nRBC: 0 % (ref 0.0–0.2)

## 2019-12-04 LAB — ABO/RH: ABO/RH(D): O NEG

## 2019-12-04 LAB — GLUCOSE, CAPILLARY: Glucose-Capillary: 186 mg/dL — ABNORMAL HIGH (ref 70–99)

## 2019-12-04 NOTE — Progress Notes (Signed)
PCP - Dr. Romero Belling Last office visit 11/08/19 in epic clearance 11/18/19 in chart Cardiologist - N/A  Chest x-ray - greater than 1 year EKG - 12/04/19 Stress Test - N/A ECHO - N/A Cardiac Cath - N/A  Sleep Study - N/A CPAP - N/A  Fasting Blood Sugar - 100-130, Hgb A1c 6.3 on 11/13/19 in chart Checks Blood Sugar occasionally not daily  Blood Thinner Instructions: N/A Aspirin Instructions:N/A Last Dose:N/A  Anesthesia review: N/A  Patient denies shortness of breath, fever, cough and chest pain at PAT appointment   Patient verbalized understanding of instructions that were given to them at the PAT appointment. Patient was also instructed that they will need to review over the PAT instructions again at home before surgery.

## 2019-12-06 NOTE — H&P (Signed)
TOTAL KNEE ADMISSION H&P  Patient is being admitted for left total knee arthroplasty.  Subjective:  Chief Complaint: Left knee pain.  HPI: [name] [name], 59 y.o. male has a history of pain and functional disability in the left knee due to arthritis and has failed non-surgical conservative treatments for greater than 12 weeks to include corticosteriod injections, viscosupplementation injections and activity modification. Onset of symptoms was gradual, starting 8 years ago with gradually worsening course since that time. The patient noted no past surgery on the left knee.  Patient currently rates pain in the left knee at 8 out of 10 with activity. Patient has worsening of pain with activity and weight bearing, pain that interferes with activities of daily living and crepitus. Patient has evidence of bone-on-bone arthritis in the medial compartment with significant patellofemoral narrowing by imaging studies. There is no active infection.  Patient Active Problem List   Diagnosis Date Noted  . Lab test positive for detection of COVID-19 virus 08/19/2019  . Hyperlipidemia LDL goal <100 06/20/2018  . Erectile dysfunction 12/16/2016  . Essential hypertension 06/19/2016  . Neuropathy due to type 2 diabetes mellitus ([place]) 06/19/2016  . Diabetes ([place]) 03/20/2015  . Diverticulosis of colon without hemorrhage   . Elevated blood pressure 08/01/2014  . Prostate hypertrophy 06/25/2014  . Esophageal reflux 06/25/2014    Past Medical History:  Diagnosis Date  . Anxiety   . Arthritis   . Diabetes mellitus without complication ([place])   . Diverticulosis    pt unaware  . ED (erectile dysfunction)   . Fatty liver   . GERD (gastroesophageal reflux disease)   . History of COVID-19 07/2019  . History of left inguinal hernia   . History of retinal detachment    Right  . Hyperlipidemia   . Hypertension   . Insomnia   . Low ferritin level   . Numbness and tingling of both feet   . Shortness of breath  dyspnea    since having COVID 07/2019 with exertions    Past Surgical History:  Procedure Laterality Date  . CHOLECYSTECTOMY    . COLONOSCOPY N/A 11/30/2014   Procedure: COLONOSCOPY;  Surgeon: Daneil Dolin, MD;  Location: [facility];  Service: Endoscopy;  Laterality: N/A;  7:30 Am  . ESOPHAGEAL DILATION    . HERNIA REPAIR     1981  . RETINAL DETACHMENT SURGERY Right   . VASCULAR SURGERY      Prior to Admission medications   Medication Sig Start Date End Date Taking? Authorizing Provider  albuterol (VENTOLIN HFA) 108 (90 Base) MCG/ACT inhaler Inhale 2 puffs into the lungs every 6 (six) hours as needed for wheezing or shortness of breath. 08/16/19  Yes Mikey Kirschner, MD  atorvastatin (LIPITOR) 20 MG tablet Take 1 tablet (20 mg total) by mouth at bedtime. 11/08/19  Yes Mikey Kirschner, MD  dutasteride (AVODART) 0.5 MG capsule TAKE 1 CAPSULE DAILY. Patient taking differently: Take 0.5 mg by mouth every evening.  11/08/19  Yes Mikey Kirschner, MD  escitalopram (LEXAPRO) 10 MG tablet Take one tablet po each morning Patient taking differently: Take 10 mg by mouth daily.  11/08/19  Yes Mikey Kirschner, MD  fluticasone (FLONASE) 50 MCG/ACT nasal spray USE 2 SPRAYS IN EACH NOSTRIL EVERY DAY, AS NEEDED. Patient taking differently: Place 2 sprays into both nostrils daily as needed (congestion.).  09/04/19  Yes Mikey Kirschner, MD  glipiZIDE (GLUCOTROL) 5 MG tablet TAKE 2 TABLETS TWICE DAILY BEFORE A  MEAL. Patient taking differently: Take 10 mg by mouth in the morning and at bedtime.  11/08/19  Yes Mikey Kirschner, MD  LORazepam (ATIVAN) 1 MG tablet Take one tablet po qhs prn anxiety or sleep Patient taking differently: Take 1 mg by mouth at bedtime.  11/08/19  Yes Mikey Kirschner, MD  losartan (COZAAR) 50 MG tablet Take 1 tablet (50 mg total) by mouth daily. 11/08/19  Yes Mikey Kirschner, MD  metFORMIN (GLUCOPHAGE) 500 MG tablet Take 2 tablets (1,000 mg total) by mouth 2 (two)  times daily. 11/08/19  Yes Mikey Kirschner, MD  naproxen sodium (ANAPROX) 220 MG tablet Take 440 mg by mouth daily.    Yes [provider]  omeprazole (PRILOSEC) 20 MG capsule TAKE 1 CAPSULE TWICE DAILY BEFORE MEALS., Patient taking differently: Take 20 mg by mouth See admin instructions. Take 1 capsule (20 mg) by mouth scheduled in the morning, may take an additional dose if needed in the evening. 11/08/19  Yes Mikey Kirschner, MD  sildenafil (REVATIO) 20 MG tablet TAKE 2 OR 3 TABLETS BY MOUTH 2 HOURS BEFORE SEX. Patient taking differently: Take 40-60 mg by mouth daily as needed (ED). Take 2 hours before sex 08/01/19  Yes Mikey Kirschner, MD  tamsulosin (FLOMAX) 0.4 MG CAPS capsule Take 2 capsules (0.8 mg total) by mouth at bedtime. 11/08/19  Yes Mikey Kirschner, MD  blood glucose meter kit and supplies KIT Dispense based on patient and insurance preference. Test sugar up to one time a day 03/20/15   Mikey Kirschner, MD  ferrous sulfate 325 (65 FE) MG tablet Take 325 mg by mouth 2 (two) times daily with a meal.    [provider]  Jonetta Speak LANCETS [address] [place]  03/20/15   [provider]  [facility] VERIO test strip  03/20/15   [provider]    Allergies  Allergen Reactions  . Dilaudid [Hydromorphone Hcl] Other (See Comments)    PT SAID HE PASSED OUT WHEN HE TOOK IT    Social History   Socioeconomic History  . Marital status: Married    Spouse name: Not on file  . Number of children: Not on file  . Years of education: Not on file  . Highest education level: Not on file  Occupational History  . Not on file  Tobacco Use  . Smoking status: Never Smoker  . Smokeless tobacco: Never Used  Substance and Sexual Activity  . Alcohol use: No  . Drug use: No  . Sexual activity: Never  Other Topics Concern  . Not on file  Social History Narrative  . Not on file   Social Determinants of Health   Financial Resource Strain:   . Difficulty of  Paying Living Expenses:   Food Insecurity:   . Worried About Charity fundraiser in the Last Year:   . Arboriculturist in the Last Year:   Transportation Needs:   . Film/video editor (Medical):   Marland Kitchen Lack of Transportation (Non-Medical):   Physical Activity:   . Days of Exercise per Week:   . Minutes of Exercise per Session:   Stress:   . Feeling of Stress :   Social Connections:   . Frequency of Communication with Friends and Family:   . Frequency of Social Gatherings with Friends and Family:   . Attends Religious Services:   . Active Member of Clubs or Organizations:   . Attends Archivist Meetings:   .  Marital Status:   Intimate Partner Violence:   . Fear of Current or Ex-Partner:   . Emotionally Abused:   Marland Kitchen Physically Abused:   . Sexually Abused:       Tobacco Use: Low Risk   . Smoking Tobacco Use: Never Smoker  . Smokeless Tobacco Use: Never Used   Social History   Substance and Sexual Activity  Alcohol Use No    No family history on file.  Review of Systems  Constitutional: Negative for chills and fever.  HENT: Negative for congestion, sore throat and tinnitus.   Eyes: Negative for double vision, photophobia and pain.  Respiratory: Negative for cough, shortness of breath and wheezing.   Cardiovascular: Negative for chest pain, palpitations and orthopnea.  Gastrointestinal: Negative for heartburn, nausea and vomiting.  Genitourinary: Negative for dysuria, frequency and urgency.  Musculoskeletal: Positive for joint pain.  Neurological: Negative for dizziness, weakness and headaches.    Objective:  Physical Exam: Well nourished and well developed.  General: Alert and oriented x3, cooperative and pleasant, no acute distress.  Head: normocephalic, atraumatic, neck supple.  Eyes: EOMI.  Respiratory: breath sounds clear in all fields, no wheezing, rales, or rhonchi. Cardiovascular: Regular rate and rhythm, no murmurs, gallops or rubs.  Abdomen:  non-tender to palpation and soft, normoactive bowel sounds. Musculoskeletal:  Left Knee Exam: There is a Baker's cyst. Range of motion is 0-125 degrees. No crepitus on range of motion of the knee. Positive medial joint line tenderness. No lateral joint line tenderness. Stable knee.  Calves soft and nontender. Motor function intact in LE. Strength 5/5 LE bilaterally. Neuro: Distal pulses 2+. Sensation to light touch intact in LE.   Vital signs in last 24 hours:  Blood pressure: 124/84 mmHg  Imaging Review Plain radiographs demonstrate severe degenerative joint disease of the left knee. The overall alignment is neutral. The bone quality appears to be adequate for age and reported activity level.  Assessment/Plan:  End stage arthritis, left knee   The patient history, physical examination, clinical judgment of the provider and imaging studies are consistent with end stage degenerative joint disease of the left knee and total knee arthroplasty is deemed medically necessary. The treatment options including medical management, injection therapy arthroscopy and arthroplasty were discussed at length. The risks and benefits of total knee arthroplasty were presented and reviewed. The risks due to aseptic loosening, infection, stiffness, patella tracking problems, thromboembolic complications and other imponderables were discussed. The patient acknowledged the explanation, agreed to proceed with the plan and consent was signed. Patient is being admitted for inpatient treatment for surgery, pain control, PT, OT, prophylactic antibiotics, VTE prophylaxis, progressive ambulation and ADLs and discharge planning. The patient is planning to be discharged home.  Anticipated LOS equal to or greater than 2 midnights due to - Age 58 and older with one or more of the following:  - Obesity  - Expected need for hospital services (PT, OT, Nursing) required for safe  discharge  - Anticipated need for  postoperative skilled nursing care or inpatient rehab  - Active co-morbidities: Diabetes OR   - Unanticipated findings during/Post Surgery: None  - Patient is a high risk of re-admission due to: None   Therapy Plans: Outpatient therapy at [facility] Linna Hoff) Disposition: Home with wife Planned DVT Prophylaxis: Aspirin 325 mg BID DME Needed: None PCP: Baltazar Apo, MD Gastroenterology: Maryelizabeth Kaufmann, MD TXA: IV Allergies: Dilaudid (passed out) Anesthesia Concerns: None BMI: 25.4 Last HgbA1c: 6.5%  Other:  - Plan for 23 hour  observation - Most recent hemoglobin 11.9, ordered by Dr. Wolfgang Phoenix. Referred to Dr. Laural Golden for evaluation, currently awaiting call to make appointment.  - Patient was instructed on what medications to stop prior to surgery. - Follow-up visit in 2 weeks with Dr. Wynelle Link - Begin physical therapy following surgery - Pre-operative lab work as pre-surgical testing - Prescriptions will be provided in hospital at time of discharge  Theresa Duty, PA-C Orthopedic Surgery [facility] [place]

## 2019-12-07 ENCOUNTER — Other Ambulatory Visit: Payer: Self-pay

## 2019-12-07 ENCOUNTER — Other Ambulatory Visit (HOSPITAL_COMMUNITY)
Admission: RE | Admit: 2019-12-07 | Discharge: 2019-12-07 | Disposition: A | Discharge status: Home / Self Care | Payer: BC Managed Care – PPO | Source: Ambulatory Visit | Attending: Orthopedic Surgery | Admitting: Orthopedic Surgery

## 2019-12-07 DIAGNOSIS — Z20822 Contact with and (suspected) exposure to covid-19: Secondary | ICD-10-CM | POA: Insufficient documentation

## 2019-12-07 DIAGNOSIS — Z01812 Encounter for preprocedural laboratory examination: Secondary | ICD-10-CM | POA: Diagnosis not present

## 2019-12-08 LAB — SARS CORONAVIRUS 2 (TAT 6-24 HRS): SARS Coronavirus 2: NEGATIVE

## 2019-12-10 ENCOUNTER — Encounter (HOSPITAL_COMMUNITY): Payer: Self-pay | Admitting: Orthopedic Surgery

## 2019-12-10 MED ORDER — BUPIVACAINE LIPOSOME 1.3 % IJ SUSP
20.0000 mL | INTRAMUSCULAR | Status: DC
  Filled 2019-12-10: qty 20

## 2019-12-10 NOTE — Anesthesia Preprocedure Evaluation (Addendum)
Anesthesia Evaluation  Patient identified by MRN, date of birth, ID band Patient awake    Reviewed: Allergy & Precautions, NPO status , Patient's Chart, lab work & pertinent test results  Airway Mallampati: I       Dental no notable dental hx. (+) Teeth Intact   Pulmonary asthma ,    Pulmonary exam normal breath sounds clear to auscultation       Cardiovascular hypertension, Pt. on medications Normal cardiovascular exam Rhythm:Regular Rate:Normal     Neuro/Psych PSYCHIATRIC DISORDERS Anxiety negative neurological ROS     GI/Hepatic GERD  Medicated,  Endo/Other  diabetes, Type 2, Oral Hypoglycemic Agents  Renal/GU   negative genitourinary   Musculoskeletal  (+) Arthritis , Osteoarthritis,    Abdominal Normal abdominal exam  (+)   Peds  Hematology negative hematology ROS (+)   Anesthesia Other Findings   Reproductive/Obstetrics                            Anesthesia Physical Anesthesia Plan  ASA: II  Anesthesia Plan: Spinal   Post-op Pain Management:  Regional for Post-op pain   Induction:   PONV Risk Score and Plan: 1 and Ondansetron, Dexamethasone and Midazolam  Airway Management Planned: Nasal Cannula, Natural Airway and Simple Face Mask  Additional Equipment: None  Intra-op Plan:   Post-operative Plan:   Informed Consent: I have reviewed the patients History and Physical, chart, labs and discussed the procedure including the risks, benefits and alternatives for the proposed anesthesia with the patient or authorized representative who has indicated his/her understanding and acceptance.     Dental advisory given  Plan Discussed with: CRNA  Anesthesia Plan Comments:        Anesthesia Quick Evaluation

## 2019-12-11 ENCOUNTER — Inpatient Hospital Stay (HOSPITAL_COMMUNITY): Payer: BC Managed Care – PPO | Admitting: Physician Assistant

## 2019-12-11 ENCOUNTER — Inpatient Hospital Stay (HOSPITAL_COMMUNITY): Payer: BC Managed Care – PPO | Admitting: Anesthesiology

## 2019-12-11 ENCOUNTER — Inpatient Hospital Stay (HOSPITAL_COMMUNITY)
Admission: RE | Admit: 2019-12-11 | Discharge: 2019-12-12 | DRG: 470 | Disposition: A | Discharge status: Home / Self Care | Payer: BC Managed Care – PPO | Attending: Orthopedic Surgery | Admitting: Orthopedic Surgery

## 2019-12-11 ENCOUNTER — Encounter (HOSPITAL_COMMUNITY): Payer: Self-pay | Admitting: Orthopedic Surgery

## 2019-12-11 ENCOUNTER — Other Ambulatory Visit: Payer: Self-pay

## 2019-12-11 ENCOUNTER — Encounter (HOSPITAL_COMMUNITY)
Admission: RE | Disposition: A | Discharge status: Home / Self Care | Payer: Self-pay | Source: Home / Self Care | Attending: Orthopedic Surgery

## 2019-12-11 DIAGNOSIS — F419 Anxiety disorder, unspecified: Secondary | ICD-10-CM | POA: Diagnosis present

## 2019-12-11 DIAGNOSIS — Z79899 Other long term (current) drug therapy: Secondary | ICD-10-CM

## 2019-12-11 DIAGNOSIS — K760 Fatty (change of) liver, not elsewhere classified: Secondary | ICD-10-CM | POA: Diagnosis present

## 2019-12-11 DIAGNOSIS — K219 Gastro-esophageal reflux disease without esophagitis: Secondary | ICD-10-CM | POA: Diagnosis present

## 2019-12-11 DIAGNOSIS — M25762 Osteophyte, left knee: Secondary | ICD-10-CM | POA: Diagnosis not present

## 2019-12-11 DIAGNOSIS — N400 Enlarged prostate without lower urinary tract symptoms: Secondary | ICD-10-CM | POA: Diagnosis present

## 2019-12-11 DIAGNOSIS — E119 Type 2 diabetes mellitus without complications: Secondary | ICD-10-CM | POA: Diagnosis present

## 2019-12-11 DIAGNOSIS — E785 Hyperlipidemia, unspecified: Secondary | ICD-10-CM | POA: Diagnosis not present

## 2019-12-11 DIAGNOSIS — Z20822 Contact with and (suspected) exposure to covid-19: Secondary | ICD-10-CM | POA: Diagnosis not present

## 2019-12-11 DIAGNOSIS — M179 Osteoarthritis of knee, unspecified: Secondary | ICD-10-CM

## 2019-12-11 DIAGNOSIS — N529 Male erectile dysfunction, unspecified: Secondary | ICD-10-CM | POA: Diagnosis present

## 2019-12-11 DIAGNOSIS — M1710 Unilateral primary osteoarthritis, unspecified knee: Secondary | ICD-10-CM

## 2019-12-11 DIAGNOSIS — Z7984 Long term (current) use of oral hypoglycemic drugs: Secondary | ICD-10-CM | POA: Diagnosis not present

## 2019-12-11 DIAGNOSIS — M1712 Unilateral primary osteoarthritis, left knee: Secondary | ICD-10-CM | POA: Diagnosis not present

## 2019-12-11 DIAGNOSIS — I10 Essential (primary) hypertension: Secondary | ICD-10-CM | POA: Diagnosis present

## 2019-12-11 DIAGNOSIS — G8918 Other acute postprocedural pain: Secondary | ICD-10-CM | POA: Diagnosis not present

## 2019-12-11 DIAGNOSIS — J45909 Unspecified asthma, uncomplicated: Secondary | ICD-10-CM | POA: Diagnosis not present

## 2019-12-11 HISTORY — PX: TOTAL KNEE ARTHROPLASTY: SHX125

## 2019-12-11 LAB — GLUCOSE, CAPILLARY
Glucose-Capillary: 131 mg/dL — ABNORMAL HIGH (ref 70–99)
Glucose-Capillary: 137 mg/dL — ABNORMAL HIGH (ref 70–99)
Glucose-Capillary: 167 mg/dL — ABNORMAL HIGH (ref 70–99)
Glucose-Capillary: 300 mg/dL — ABNORMAL HIGH (ref 70–99)
Glucose-Capillary: 286 mg/dL — ABNORMAL HIGH (ref 70–99)

## 2019-12-11 LAB — TYPE AND SCREEN
ABO/RH(D): O NEG
Antibody Screen: NEGATIVE

## 2019-12-11 SURGERY — ARTHROPLASTY, KNEE, TOTAL
Anesthesia: Spinal | Site: Knee | Laterality: Left

## 2019-12-11 MED ORDER — ONDANSETRON HCL 4 MG PO TABS: 4.0000 mg | ORAL_TABLET | Freq: Four times a day (QID) | ORAL | Status: DC | PRN

## 2019-12-11 MED ORDER — METOCLOPRAMIDE HCL 5 MG/ML IJ SOLN: 5.0000 mg | Freq: Three times a day (TID) | INTRAMUSCULAR | Status: DC | PRN

## 2019-12-11 MED ORDER — POLYETHYLENE GLYCOL 3350 17 G PO PACK: 17.0000 g | PACK | Freq: Every day | ORAL | Status: DC | PRN

## 2019-12-11 MED ORDER — DEXAMETHASONE SODIUM PHOSPHATE 10 MG/ML IJ SOLN
INTRAMUSCULAR | Status: AC
  Filled 2019-12-11: qty 1

## 2019-12-11 MED ORDER — OXYCODONE HCL 5 MG PO TABS
10.0000 mg | ORAL_TABLET | ORAL | Status: DC | PRN
  Administered 2019-12-11 – 2019-12-12 (×5): 10 mg via ORAL
  Filled 2019-12-11 (×5): qty 2

## 2019-12-11 MED ORDER — METHOCARBAMOL 500 MG PO TABS
500.0000 mg | ORAL_TABLET | Freq: Four times a day (QID) | ORAL | Status: DC | PRN
  Administered 2019-12-12: 500 mg via ORAL
  Filled 2019-12-11: qty 1

## 2019-12-11 MED ORDER — FLUTICASONE PROPIONATE 50 MCG/ACT NA SUSP
2.0000 | Freq: Every day | NASAL | Status: DC | PRN
  Filled 2019-12-11: qty 16

## 2019-12-11 MED ORDER — DUTASTERIDE 0.5 MG PO CAPS
0.5000 mg | ORAL_CAPSULE | Freq: Every evening | ORAL | Status: DC
  Administered 2019-12-11: 0.5 mg via ORAL
  Filled 2019-12-11 (×2): qty 1

## 2019-12-11 MED ORDER — PROPOFOL 500 MG/50ML IV EMUL
INTRAVENOUS | Status: AC
  Filled 2019-12-11: qty 50

## 2019-12-11 MED ORDER — ONDANSETRON HCL 4 MG/2ML IJ SOLN: 4.0000 mg | Freq: Four times a day (QID) | INTRAMUSCULAR | Status: DC | PRN

## 2019-12-11 MED ORDER — ROPIVACAINE HCL 5 MG/ML IJ SOLN
INTRAMUSCULAR | Status: DC | PRN
  Administered 2019-12-11 (×2): 5 mL via PERINEURAL

## 2019-12-11 MED ORDER — ESCITALOPRAM OXALATE 10 MG PO TABS
10.0000 mg | ORAL_TABLET | Freq: Every day | ORAL | Status: DC
  Administered 2019-12-12: 10 mg via ORAL
  Filled 2019-12-11: qty 1

## 2019-12-11 MED ORDER — LORAZEPAM 1 MG PO TABS
1.0000 mg | ORAL_TABLET | Freq: Every day | ORAL | Status: DC
  Administered 2019-12-11: 1 mg via ORAL
  Filled 2019-12-11: qty 1

## 2019-12-11 MED ORDER — MIDAZOLAM HCL 2 MG/2ML IJ SOLN
INTRAMUSCULAR | Status: AC
  Filled 2019-12-11: qty 2

## 2019-12-11 MED ORDER — PANTOPRAZOLE SODIUM 40 MG PO TBEC
40.0000 mg | DELAYED_RELEASE_TABLET | Freq: Every day | ORAL | Status: DC
  Administered 2019-12-12: 40 mg via ORAL
  Filled 2019-12-11: qty 1

## 2019-12-11 MED ORDER — MEPERIDINE HCL 50 MG/ML IJ SOLN: 6.2500 mg | INTRAMUSCULAR | Status: DC | PRN

## 2019-12-11 MED ORDER — EPHEDRINE 5 MG/ML INJ
INTRAVENOUS | Status: AC
  Filled 2019-12-11: qty 10

## 2019-12-11 MED ORDER — TAMSULOSIN HCL 0.4 MG PO CAPS
0.8000 mg | ORAL_CAPSULE | Freq: Every day | ORAL | Status: DC
  Administered 2019-12-11: 0.8 mg via ORAL
  Filled 2019-12-11: qty 2

## 2019-12-11 MED ORDER — PROPOFOL 10 MG/ML IV BOLUS
INTRAVENOUS | Status: AC
  Filled 2019-12-11: qty 40

## 2019-12-11 MED ORDER — PHENOL 1.4 % MT LIQD: 1.0000 | OROMUCOSAL | Status: DC | PRN

## 2019-12-11 MED ORDER — ATORVASTATIN CALCIUM 20 MG PO TABS
20.0000 mg | ORAL_TABLET | Freq: Every day | ORAL | Status: DC
  Administered 2019-12-11: 20 mg via ORAL
  Filled 2019-12-11: qty 1

## 2019-12-11 MED ORDER — BISACODYL 10 MG RE SUPP: 10.0000 mg | Freq: Every day | RECTAL | Status: DC | PRN

## 2019-12-11 MED ORDER — KETOROLAC TROMETHAMINE 15 MG/ML IJ SOLN: 15.0000 mg | Freq: Once | INTRAMUSCULAR | Status: DC

## 2019-12-11 MED ORDER — PROPOFOL 500 MG/50ML IV EMUL
INTRAVENOUS | Status: DC | PRN
  Administered 2019-12-11: 50 ug/kg/min via INTRAVENOUS

## 2019-12-11 MED ORDER — SODIUM CHLORIDE (PF) 0.9 % IJ SOLN
INTRAMUSCULAR | Status: AC
  Filled 2019-12-11: qty 50

## 2019-12-11 MED ORDER — FENTANYL CITRATE (PF) 100 MCG/2ML IJ SOLN
INTRAMUSCULAR | Status: AC
  Filled 2019-12-11: qty 2

## 2019-12-11 MED ORDER — ONDANSETRON HCL 4 MG/2ML IJ SOLN
INTRAMUSCULAR | Status: AC
  Filled 2019-12-11: qty 2

## 2019-12-11 MED ORDER — PROMETHAZINE HCL 25 MG/ML IJ SOLN: 6.2500 mg | INTRAMUSCULAR | Status: DC | PRN

## 2019-12-11 MED ORDER — MIDAZOLAM HCL 5 MG/5ML IJ SOLN
INTRAMUSCULAR | Status: DC | PRN
  Administered 2019-12-11 (×2): 1 mg via INTRAVENOUS

## 2019-12-11 MED ORDER — ONDANSETRON HCL 4 MG/2ML IJ SOLN
INTRAMUSCULAR | Status: DC | PRN
  Administered 2019-12-11: 4 mg via INTRAVENOUS

## 2019-12-11 MED ORDER — LOSARTAN POTASSIUM 50 MG PO TABS
50.0000 mg | ORAL_TABLET | Freq: Every day | ORAL | Status: DC
  Administered 2019-12-12: 50 mg via ORAL
  Filled 2019-12-11: qty 1

## 2019-12-11 MED ORDER — CEFAZOLIN SODIUM-DEXTROSE 2-4 GM/100ML-% IV SOLN
INTRAVENOUS | Status: AC
  Filled 2019-12-11: qty 100

## 2019-12-11 MED ORDER — PHENYLEPHRINE 40 MCG/ML (10ML) SYRINGE FOR IV PUSH (FOR BLOOD PRESSURE SUPPORT)
PREFILLED_SYRINGE | INTRAVENOUS | Status: DC | PRN
  Administered 2019-12-11 (×6): 80 ug via INTRAVENOUS
  Administered 2019-12-11: 120 ug via INTRAVENOUS
  Administered 2019-12-11 (×2): 80 ug via INTRAVENOUS

## 2019-12-11 MED ORDER — BUPIVACAINE IN DEXTROSE 0.75-8.25 % IT SOLN
INTRATHECAL | Status: DC | PRN
  Administered 2019-12-11: 1.6 mL via INTRATHECAL

## 2019-12-11 MED ORDER — FERROUS SULFATE 325 (65 FE) MG PO TABS
325.0000 mg | ORAL_TABLET | Freq: Two times a day (BID) | ORAL | Status: DC
  Administered 2019-12-11 – 2019-12-12 (×2): 325 mg via ORAL
  Filled 2019-12-11 (×2): qty 1

## 2019-12-11 MED ORDER — OXYCODONE HCL 5 MG PO TABS
5.0000 mg | ORAL_TABLET | ORAL | Status: DC | PRN
  Administered 2019-12-11: 5 mg via ORAL
  Filled 2019-12-11: qty 1

## 2019-12-11 MED ORDER — PHENYLEPHRINE 40 MCG/ML (10ML) SYRINGE FOR IV PUSH (FOR BLOOD PRESSURE SUPPORT)
PREFILLED_SYRINGE | INTRAVENOUS | Status: AC
  Filled 2019-12-11: qty 10

## 2019-12-11 MED ORDER — STERILE WATER FOR IRRIGATION IR SOLN
Status: DC | PRN
  Administered 2019-12-11: 2000 mL

## 2019-12-11 MED ORDER — INSULIN ASPART 100 UNIT/ML ~~LOC~~ SOLN
0.0000 [IU] | Freq: Three times a day (TID) | SUBCUTANEOUS | Status: DC
  Administered 2019-12-11: 8 [IU] via SUBCUTANEOUS
  Administered 2019-12-11: 3 [IU] via SUBCUTANEOUS
  Administered 2019-12-12: 5 [IU] via SUBCUTANEOUS
  Administered 2019-12-12: 2 [IU] via SUBCUTANEOUS

## 2019-12-11 MED ORDER — SODIUM CHLORIDE (PF) 0.9 % IJ SOLN
INTRAMUSCULAR | Status: AC
  Filled 2019-12-11: qty 10

## 2019-12-11 MED ORDER — SODIUM CHLORIDE 0.9 % IV SOLN: INTRAVENOUS | Status: DC

## 2019-12-11 MED ORDER — ALBUTEROL SULFATE (2.5 MG/3ML) 0.083% IN NEBU: 3.0000 mL | INHALATION_SOLUTION | Freq: Four times a day (QID) | RESPIRATORY_TRACT | Status: DC | PRN

## 2019-12-11 MED ORDER — SODIUM CHLORIDE (PF) 0.9 % IJ SOLN
INTRAMUSCULAR | Status: DC | PRN
  Administered 2019-12-11: 60 mL

## 2019-12-11 MED ORDER — HYDROMORPHONE HCL 1 MG/ML IJ SOLN: 0.2500 mg | INTRAMUSCULAR | Status: DC | PRN

## 2019-12-11 MED ORDER — CLONIDINE HCL (ANALGESIA) 100 MCG/ML EP SOLN
EPIDURAL | Status: DC | PRN
  Administered 2019-12-11: 100 ug

## 2019-12-11 MED ORDER — DIPHENHYDRAMINE HCL 12.5 MG/5ML PO ELIX: 12.5000 mg | ORAL_SOLUTION | ORAL | Status: DC | PRN

## 2019-12-11 MED ORDER — GLIPIZIDE 10 MG PO TABS
10.0000 mg | ORAL_TABLET | Freq: Two times a day (BID) | ORAL | Status: DC
  Administered 2019-12-11 – 2019-12-12 (×2): 10 mg via ORAL
  Filled 2019-12-11 (×2): qty 1

## 2019-12-11 MED ORDER — CHLORHEXIDINE GLUCONATE 4 % EX LIQD: 60.0000 mL | Freq: Once | CUTANEOUS | Status: DC

## 2019-12-11 MED ORDER — CEFAZOLIN SODIUM-DEXTROSE 2-4 GM/100ML-% IV SOLN
2.0000 g | Freq: Four times a day (QID) | INTRAVENOUS | Status: AC
  Administered 2019-12-11 (×2): 2 g via INTRAVENOUS
  Filled 2019-12-11 (×2): qty 100

## 2019-12-11 MED ORDER — BUPIVACAINE LIPOSOME 1.3 % IJ SUSP
INTRAMUSCULAR | Status: DC | PRN
  Administered 2019-12-11: 20 mL

## 2019-12-11 MED ORDER — EPHEDRINE SULFATE-NACL 50-0.9 MG/10ML-% IV SOSY
PREFILLED_SYRINGE | INTRAVENOUS | Status: DC | PRN
  Administered 2019-12-11: 5 mg via INTRAVENOUS
  Administered 2019-12-11 (×3): 10 mg via INTRAVENOUS

## 2019-12-11 MED ORDER — LACTATED RINGERS IV SOLN
INTRAVENOUS | Status: DC
  Administered 2019-12-11: 1000 mL via INTRAVENOUS

## 2019-12-11 MED ORDER — GABAPENTIN 300 MG PO CAPS
300.0000 mg | ORAL_CAPSULE | Freq: Three times a day (TID) | ORAL | Status: DC
  Administered 2019-12-11 – 2019-12-12 (×4): 300 mg via ORAL
  Filled 2019-12-11 (×4): qty 1

## 2019-12-11 MED ORDER — ACETAMINOPHEN 10 MG/ML IV SOLN
INTRAVENOUS | Status: AC
  Filled 2019-12-11: qty 100

## 2019-12-11 MED ORDER — DEXAMETHASONE SODIUM PHOSPHATE 10 MG/ML IJ SOLN
8.0000 mg | Freq: Once | INTRAMUSCULAR | Status: AC
  Administered 2019-12-11: 8 mg via INTRAVENOUS

## 2019-12-11 MED ORDER — ACETAMINOPHEN 10 MG/ML IV SOLN
1000.0000 mg | Freq: Four times a day (QID) | INTRAVENOUS | Status: DC
  Administered 2019-12-11: 1000 mg via INTRAVENOUS

## 2019-12-11 MED ORDER — METHOCARBAMOL 500 MG IVPB - SIMPLE MED
500.0000 mg | Freq: Four times a day (QID) | INTRAVENOUS | Status: DC | PRN
  Filled 2019-12-11: qty 50

## 2019-12-11 MED ORDER — SODIUM CHLORIDE 0.9 % IR SOLN
Status: DC | PRN
  Administered 2019-12-11: 1000 mL

## 2019-12-11 MED ORDER — 0.9 % SODIUM CHLORIDE (POUR BTL) OPTIME
TOPICAL | Status: DC | PRN
  Administered 2019-12-11: 1000 mL

## 2019-12-11 MED ORDER — CEFAZOLIN SODIUM-DEXTROSE 2-4 GM/100ML-% IV SOLN
2.0000 g | INTRAVENOUS | Status: AC
  Administered 2019-12-11: 2 g via INTRAVENOUS
  Filled 2019-12-11: qty 100

## 2019-12-11 MED ORDER — ROPIVACAINE HCL 7.5 MG/ML IJ SOLN
INTRAMUSCULAR | Status: DC | PRN
  Administered 2019-12-11 (×4): 5 mL via PERINEURAL

## 2019-12-11 MED ORDER — PROPOFOL 10 MG/ML IV BOLUS
INTRAVENOUS | Status: DC | PRN
  Administered 2019-12-11: 20 mg via INTRAVENOUS
  Administered 2019-12-11: 30 mg via INTRAVENOUS

## 2019-12-11 MED ORDER — FLEET ENEMA 7-19 GM/118ML RE ENEM: 1.0000 | ENEMA | Freq: Once | RECTAL | Status: DC | PRN

## 2019-12-11 MED ORDER — ASPIRIN EC 325 MG PO TBEC
325.0000 mg | DELAYED_RELEASE_TABLET | Freq: Two times a day (BID) | ORAL | Status: DC
  Administered 2019-12-12 (×2): 325 mg via ORAL
  Filled 2019-12-11 (×2): qty 1

## 2019-12-11 MED ORDER — DOCUSATE SODIUM 100 MG PO CAPS
100.0000 mg | ORAL_CAPSULE | Freq: Two times a day (BID) | ORAL | Status: DC
  Administered 2019-12-12: 100 mg via ORAL
  Filled 2019-12-11: qty 1

## 2019-12-11 MED ORDER — METOCLOPRAMIDE HCL 5 MG PO TABS: 5.0000 mg | ORAL_TABLET | Freq: Three times a day (TID) | ORAL | Status: DC | PRN

## 2019-12-11 MED ORDER — MORPHINE SULFATE (PF) 2 MG/ML IV SOLN: 0.5000 mg | INTRAVENOUS | Status: DC | PRN

## 2019-12-11 MED ORDER — TRANEXAMIC ACID-NACL 1000-0.7 MG/100ML-% IV SOLN
INTRAVENOUS | Status: AC
  Filled 2019-12-11: qty 100

## 2019-12-11 MED ORDER — TRANEXAMIC ACID-NACL 1000-0.7 MG/100ML-% IV SOLN
1000.0000 mg | INTRAVENOUS | Status: AC
  Administered 2019-12-11: 1000 mg via INTRAVENOUS

## 2019-12-11 MED ORDER — ACETAMINOPHEN 500 MG PO TABS
1000.0000 mg | ORAL_TABLET | Freq: Four times a day (QID) | ORAL | Status: AC
  Administered 2019-12-11 – 2019-12-12 (×4): 1000 mg via ORAL
  Filled 2019-12-11 (×4): qty 2

## 2019-12-11 MED ORDER — MENTHOL 3 MG MT LOZG: 1.0000 | LOZENGE | OROMUCOSAL | Status: DC | PRN

## 2019-12-11 MED ORDER — FENTANYL CITRATE (PF) 100 MCG/2ML IJ SOLN
INTRAMUSCULAR | Status: DC | PRN
  Administered 2019-12-11 (×2): 50 ug via INTRAVENOUS

## 2019-12-11 MED ORDER — POVIDONE-IODINE 10 % EX SWAB
2.0000 "application " | Freq: Once | CUTANEOUS | Status: AC
  Administered 2019-12-11: 2 via TOPICAL

## 2019-12-11 SURGICAL SUPPLY — 58 items
ATTUNE MED DOME PAT 41 KNEE (Knees) ×2 IMPLANT
ATTUNE PS FEM LT SZ 7 CEM KNEE (Femur) ×2 IMPLANT
ATTUNE PSRP INSR SZ7 10 KNEE (Insert) ×2 IMPLANT
BAG ZIPLOCK 12X15 (MISCELLANEOUS) ×2 IMPLANT
BASE TIBIAL ROT PLAT SZ 7 KNEE (Knees) ×1 IMPLANT
BLADE SAG 18X100X1.27 (BLADE) ×2 IMPLANT
BLADE SAW SGTL 11.0X1.19X90.0M (BLADE) ×2 IMPLANT
BLADE SURG SZ10 CARB STEEL (BLADE) ×4 IMPLANT
BNDG ELASTIC 6X5.8 VLCR STR LF (GAUZE/BANDAGES/DRESSINGS) ×2 IMPLANT
BOWL SMART MIX CTS (DISPOSABLE) ×2 IMPLANT
CEMENT HV SMART SET (Cement) ×4 IMPLANT
CLSR STERI-STRIP ANTIMIC 1/2X4 (GAUZE/BANDAGES/DRESSINGS) ×2 IMPLANT
COVER SURGICAL LIGHT HANDLE (MISCELLANEOUS) ×2 IMPLANT
COVER WAND RF STERILE (DRAPES) IMPLANT
CUFF TOURN SGL QUICK 34 (TOURNIQUET CUFF) ×2
CUFF TRNQT CYL 34X4.125X (TOURNIQUET CUFF) ×1 IMPLANT
DECANTER SPIKE VIAL GLASS SM (MISCELLANEOUS) ×2 IMPLANT
DRAPE U-SHAPE 47X51 STRL (DRAPES) ×2 IMPLANT
DRESSING AQUACEL AG SP 3.5X10 (GAUZE/BANDAGES/DRESSINGS) ×1 IMPLANT
DRSG AQUACEL AG ADV 3.5X10 (GAUZE/BANDAGES/DRESSINGS) ×2 IMPLANT
DRSG AQUACEL AG SP 3.5X10 (GAUZE/BANDAGES/DRESSINGS) ×2
DURAPREP 26ML APPLICATOR (WOUND CARE) ×2 IMPLANT
ELECT REM PT RETURN 15FT ADLT (MISCELLANEOUS) ×2 IMPLANT
EVACUATOR 1/8 PVC DRAIN (DRAIN) IMPLANT
GAUZE SPONGE 2X2 8PLY STRL LF (GAUZE/BANDAGES/DRESSINGS) ×1 IMPLANT
GLOVE BIO SURGEON STRL SZ7 (GLOVE) ×2 IMPLANT
GLOVE BIO SURGEON STRL SZ8 (GLOVE) ×2 IMPLANT
GLOVE BIOGEL PI IND STRL 7.0 (GLOVE) ×1 IMPLANT
GLOVE BIOGEL PI IND STRL 8 (GLOVE) ×1 IMPLANT
GLOVE BIOGEL PI INDICATOR 7.0 (GLOVE) ×1
GLOVE BIOGEL PI INDICATOR 8 (GLOVE) ×1
GOWN STRL REUS W/TWL LRG LVL3 (GOWN DISPOSABLE) ×4 IMPLANT
HANDPIECE INTERPULSE COAX TIP (DISPOSABLE) ×2
HOLDER FOLEY CATH W/STRAP (MISCELLANEOUS) IMPLANT
IMMOBILIZER KNEE 20 (SOFTGOODS) ×2
IMMOBILIZER KNEE 20 THIGH 36 (SOFTGOODS) ×1 IMPLANT
KIT TURNOVER KIT A (KITS) IMPLANT
MANIFOLD NEPTUNE II (INSTRUMENTS) ×2 IMPLANT
NS IRRIG 1000ML POUR BTL (IV SOLUTION) ×2 IMPLANT
PACK TOTAL KNEE CUSTOM (KITS) ×2 IMPLANT
PADDING CAST COTTON 6X4 STRL (CAST SUPPLIES) ×2 IMPLANT
PENCIL SMOKE EVACUATOR (MISCELLANEOUS) IMPLANT
PIN DRILL FIX HALF THREAD (BIT) ×2 IMPLANT
PIN STEINMAN FIXATION KNEE (PIN) ×2 IMPLANT
PROTECTOR NERVE ULNAR (MISCELLANEOUS) ×2 IMPLANT
SET HNDPC FAN SPRY TIP SCT (DISPOSABLE) ×1 IMPLANT
SPONGE GAUZE 2X2 STER 10/PKG (GAUZE/BANDAGES/DRESSINGS) ×1
STRIP CLOSURE SKIN 1/2X4 (GAUZE/BANDAGES/DRESSINGS) ×4 IMPLANT
SUT MNCRL AB 4-0 PS2 18 (SUTURE) ×2 IMPLANT
SUT STRATAFIX 0 PDS 27 VIOLET (SUTURE) ×2
SUT VIC AB 2-0 CT1 27 (SUTURE) ×6
SUT VIC AB 2-0 CT1 TAPERPNT 27 (SUTURE) ×3 IMPLANT
SUTURE STRATFX 0 PDS 27 VIOLET (SUTURE) ×1 IMPLANT
TIBIAL BASE ROT PLAT SZ 7 KNEE (Knees) ×2 IMPLANT
TRAY FOLEY MTR SLVR 16FR STAT (SET/KITS/TRAYS/PACK) ×2 IMPLANT
WATER STERILE IRR 1000ML POUR (IV SOLUTION) ×4 IMPLANT
WRAP KNEE MAXI GEL POST OP (GAUZE/BANDAGES/DRESSINGS) ×2 IMPLANT
YANKAUER SUCT BULB TIP 10FT TU (MISCELLANEOUS) ×2 IMPLANT

## 2019-12-11 NOTE — Plan of Care (Signed)
Plan of care 

## 2019-12-11 NOTE — Op Note (Signed)
OPERATIVE REPORT-TOTAL KNEE ARTHROPLASTY   Pre-operative diagnosis- Osteoarthritis  Left knee(s)  Post-operative diagnosis- Osteoarthritis Left knee(s)  Procedure-  Left  Total Knee Arthroplasty  Surgeon- Gus Rankin. Manmeet Arzola, MD  Assistant- Arther Abbott, PA-C   Anesthesia-  Adductor canal block and spinal  EBL-25 mL   Drains Hemovac  Tourniquet time-  Total Tourniquet Time Documented: Thigh (Left) - 33 minutes Total: Thigh (Left) - 33 minutes     Complications- None  Condition-PACU - hemodynamically stable.   Brief Clinical Note   [name] [name] is a 59 y.o. year old male with end stage OA of his left knee with progressively worsening pain and dysfunction. He has constant pain, with activity and at rest and significant functional deficits with difficulties even with ADLs. He has had extensive non-op management including analgesics, injections of cortisone and viscosupplements, and home exercise program, but remains in significant pain with significant dysfunction. Radiographs show bone on bone arthritis medial and patellofemoral. He presents now for left Total Knee Arthroplasty.    Procedure in detail---   The patient is brought into the operating room and positioned supine on the operating table. After successful administration of  Adductor canal block and spinal,   a tourniquet is placed high on the  Left thigh(s) and the lower extremity is prepped and draped in the usual sterile fashion. Time out is performed by the operating team and then the  Left lower extremity is wrapped in Esmarch, knee flexed and the tourniquet inflated to 300 mmHg.       A midline incision is made with a ten blade through the subcutaneous tissue to the level of the extensor mechanism. A fresh blade is used to make a medial parapatellar arthrotomy. Soft tissue over the proximal medial tibia is subperiosteally elevated to the joint line with a knife and into the semimembranosus bursa with a Cobb  elevator. Soft tissue over the proximal lateral tibia is elevated with attention being paid to avoiding the patellar tendon on the tibial tubercle. The patella is everted, knee flexed 90 degrees and the ACL and PCL are removed. Findings are bone on bone medial and patellofemoral with large global osteophytes        The drill is used to create a starting hole in the distal femur and the canal is thoroughly irrigated with sterile saline to remove the fatty contents. The 5 degree Left  valgus alignment guide is placed into the femoral canal and the distal femoral cutting block is pinned to remove 9 mm off the distal femur. Resection is made with an oscillating saw.      The tibia is subluxed forward and the menisci are removed. The extramedullary alignment guide is placed referencing proximally at the medial aspect of the tibial tubercle and distally along the second metatarsal axis and tibial crest. The block is pinned to remove 8mm off the more deficient medial  side. Resection is made with an oscillating saw. Size 7is the most appropriate size for the tibia and the proximal tibia is prepared with the modular drill and keel punch for that size.      The femoral sizing guide is placed and size 7 is most appropriate. Rotation is marked off the epicondylar axis and confirmed by creating a rectangular flexion gap at 90 degrees. The size 7 cutting block is pinned in this rotation and the anterior, posterior and chamfer cuts are made with the oscillating saw. The intercondylar block is then placed and that cut is made.  Trial size 7 tibial component, trial size 7 posterior stabilized femur and a 10  mm posterior stabilized rotating platform insert trial is placed. Full extension is achieved with excellent varus/valgus and anterior/posterior balance throughout full range of motion. The patella is everted and thickness measured to be 27  mm. Free hand resection is taken to 15 mm, a 41 template is placed, lug holes  are drilled, trial patella is placed, and it tracks normally. Osteophytes are removed off the posterior femur with the trial in place. All trials are removed and the cut bone surfaces prepared with pulsatile lavage. Cement is mixed and once ready for implantation, the size 7 tibial implant, size  7 posterior stabilized femoral component, and the size 41 patella are cemented in place and the patella is held with the clamp. The trial insert is placed and the knee held in full extension. The Exparel (20 ml mixed with 60 ml saline) is injected into the extensor mechanism, posterior capsule, medial and lateral gutters and subcutaneous tissues.  All extruded cement is removed and once the cement is hard the permanent 10 mm posterior stabilized rotating platform insert is placed into the tibial tray.      The wound is copiously irrigated with saline solution and the extensor mechanism closed over a hemovac drain with #1 V-loc suture. The tourniquet is released for a total tourniquet time of 33  minutes. Flexion against gravity is 140 degrees and the patella tracks normally. Subcutaneous tissue is closed with 2.0 vicryl and subcuticular with running 4.0 Monocryl. The incision is cleaned and dried and steri-strips and a bulky sterile dressing are applied. The limb is placed into a knee immobilizer and the patient is awakened and transported to recovery in stable condition.      Please note that a surgical assistant was a medical necessity for this procedure in order to perform it in a safe and expeditious manner. Surgical assistant was necessary to retract the ligaments and vital neurovascular structures to prevent injury to them and also necessary for proper positioning of the limb to allow for anatomic placement of the prosthesis.   Dione Plover Josephine Rudnick, MD    12/11/2019, 8:23 AM

## 2019-12-11 NOTE — Interval H&P Note (Signed)
History and Physical Interval Note:  12/11/2019 6:47 AM  [name] [name]  has presented today for surgery, with the diagnosis of left knee osteoarthritis.  The various methods of treatment have been discussed with the patient and family. After consideration of risks, benefits and other options for treatment, the patient has consented to  Procedure(s) with comments: TOTAL KNEE ARTHROPLASTY (Left) - as a surgical intervention.  The patient's history has been reviewed, patient examined, no change in status, stable for surgery.  I have reviewed the patient's chart and labs.  Questions were answered to the patient's satisfaction.     [name]

## 2019-12-11 NOTE — Anesthesia Procedure Notes (Addendum)
Spinal  Patient location during procedure: OR Start time: 12/11/2019 7:13 AM End time: 12/11/2019 7:17 AM Reason for block: at surgeon's request Staffing Performed: resident/CRNA  Anesthesiologist: Lyn Hollingshead, MD Resident/CRNA: West Pugh, CRNA Preanesthetic Checklist Completed: patient identified, IV checked, site marked, risks and benefits discussed, surgical consent, monitors and equipment checked, pre-op evaluation and timeout performed Spinal Block Patient position: sitting Prep: DuraPrep Patient monitoring: heart rate, continuous pulse ox and blood pressure Approach: midline Location: L3-4 Injection technique: single-shot Needle Needle type: Pencan  Needle gauge: 24 G Needle length: 9 cm Assessment Sensory level: T6 Additional Notes Expiration of kit checked and confirmed. Patient tolerated procedure well,without complications with noted clear CSF. Loss of motor and sensory on exam post injection. Dr Jillyn Hidden present for procedure.

## 2019-12-11 NOTE — Anesthesia Postprocedure Evaluation (Signed)
Anesthesia Post Note  Patient: [name] [name]  Procedure(s) Performed: TOTAL KNEE ARTHROPLASTY (Left Knee)     Patient location during evaluation: PACU Anesthesia Type: Spinal Level of consciousness: awake Pain management: pain level controlled Vital Signs Assessment: post-procedure vital signs reviewed and stable Respiratory status: spontaneous breathing Cardiovascular status: stable Postop Assessment: no headache, no backache, spinal receding, patient able to bend at knees and no apparent nausea or vomiting Anesthetic complications: no    Last Vitals:  Vitals:   12/11/19 0845 12/11/19 0945  BP: 119/83   Pulse: 72   Resp: 14   Temp: 36.5 C 36.7 C  SpO2: 100%     Last Pain:  Vitals:   12/11/19 0945  TempSrc:   PainSc: 0-No pain   Pain Goal:    LLE Motor Response: Purposeful movement (12/11/19 0945) LLE Sensation: Decreased (12/11/19 0945) RLE Motor Response: Purposeful movement (12/11/19 0945) RLE Sensation: Decreased (12/11/19 0945) L Sensory Level: S1-Sole of foot, small toes (12/11/19 0945) R Sensory Level: S1-Sole of foot, small toes (12/11/19 0945) Epidural/Spinal Function Cutaneous sensation: Able to Wiggle Toes (12/11/19 0945), Patient able to flex knees: Yes (12/11/19 0945)  Caren Macadam

## 2019-12-11 NOTE — Discharge Instructions (Addendum)
 [name] Aluisio, MD Total Joint Specialist [facility] [address] [place], [place] [address] [phone]  TOTAL KNEE REPLACEMENT POSTOPERATIVE DIRECTIONS    Knee Rehabilitation, Guidelines Following Surgery  Results after knee surgery are often greatly improved when you follow the exercise, range of motion and muscle strengthening exercises prescribed by your doctor. Safety measures are also important to protect the knee from further injury. If any of these exercises cause you to have increased pain or swelling in your knee joint, decrease the amount until you are comfortable again and slowly increase them. If you have problems or questions, call your caregiver or physical therapist for advice.   BLOOD CLOT PREVENTION . Take a 325 mg Aspirin two times a day for three weeks following surgery. Then take an 81 mg Aspirin once a day for three weeks. Then discontinue Aspirin. . You may resume your vitamins/supplements upon discharge from the hospital. . Do not take any NSAIDs (Advil, Aleve, Ibuprofen, Meloxicam, etc.) until you have discontinued the 325 mg Aspirin.  HOME CARE INSTRUCTIONS  . Remove items at home which could result in a fall. This includes throw rugs or furniture in walking pathways.  . ICE to the affected knee as much as tolerated. Icing helps control swelling. If the swelling is well controlled you will be more comfortable and rehab easier. Continue to use ice on the knee for pain and swelling from surgery. You may notice swelling that will progress down to the foot and ankle. This is normal after surgery. Elevate the leg when you are not up walking on it.    . Continue to use the breathing machine which will help keep your temperature down. It is common for your temperature to cycle up and down following surgery, especially at night when you are not up moving around and exerting yourself. The breathing machine keeps your lungs expanded and your  temperature down. . Do not place pillow under the operative knee, focus on keeping the knee straight while resting  DIET You may resume your previous home diet once you are discharged from the hospital.  DRESSING / WOUND CARE / SHOWERING . Keep your bulky bandage on for 2 days. On the third post-operative day you may remove the Ace bandage and gauze. There is a waterproof adhesive bandage on your skin which will stay in place until your first follow-up appointment. Once you remove this you will not need to place another bandage . You may begin showering 3 days following surgery, but do not submerge the incision under water.  ACTIVITY For the first 5 days, the key is rest and control of pain and swelling . Do your home exercises twice a day starting on post-operative day 3. On the days you go to physical therapy, just do the home exercises once that day. . You should rest, ice and elevate the leg for 50 minutes out of every hour. Get up and walk/stretch for 10 minutes per hour. After 5 days you can increase your activity slowly as tolerated. . Walk with your walker as instructed. Use the walker until you are comfortable transitioning to a cane. Walk with the cane in the opposite hand of the operative leg. You may discontinue the cane once you are comfortable and walking steadily. . Avoid periods of inactivity such as sitting longer than an hour when not asleep. This helps prevent blood clots.  . You may discontinue the knee immobilizer once you are able to perform a straight   leg raise while lying down. . You may resume a sexual relationship in one month or when given the OK by your doctor.  . You may return to work once you are cleared by your doctor.  . Do not drive a car for 6 weeks or until released by your surgeon.  . Do not drive while taking narcotics.  TED HOSE STOCKINGS Wear the elastic stockings on both legs for three weeks following surgery during the day. You may remove them at night  for sleeping.  WEIGHT BEARING Weight bearing as tolerated with assist device (walker, cane, etc) as directed, use it as long as suggested by your surgeon or therapist, typically at least 4-6 weeks.  POSTOPERATIVE CONSTIPATION PROTOCOL Constipation - defined medically as fewer than three stools per week and severe constipation as less than one stool per week.  One of the most common issues patients have following surgery is constipation.  Even if you have a regular bowel pattern at home, your normal regimen is likely to be disrupted due to multiple reasons following surgery.  Combination of anesthesia, postoperative narcotics, change in appetite and fluid intake all can affect your bowels.  In order to avoid complications following surgery, here are some recommendations in order to help you during your recovery period.  . Colace (docusate) - Pick up an over-the-counter form of Colace or another stool softener and take twice a day as long as you are requiring postoperative pain medications.  Take with a full glass of water daily.  If you experience loose stools or diarrhea, hold the colace until you stool forms back up. If your symptoms do not get better within 1 week or if they get worse, check with your doctor. . Dulcolax (bisacodyl) - Pick up over-the-counter and take as directed by the product packaging as needed to assist with the movement of your bowels.  Take with a full glass of water.  Use this product as needed if not relieved by Colace only.  . MiraLax (polyethylene glycol) - Pick up over-the-counter to have on hand. MiraLax is a solution that will increase the amount of water in your bowels to assist with bowel movements.  Take as directed and can mix with a glass of water, juice, soda, coffee, or tea. Take if you go more than two days without a movement. Do not use MiraLax more than once per day. Call your doctor if you are still constipated or irregular after using this medication for 7 days  in a row.  If you continue to have problems with postoperative constipation, please contact the office for further assistance and recommendations.  If you experience "the worst abdominal pain ever" or develop nausea or vomiting, please contact the office immediatly for further recommendations for treatment.  ITCHING If you experience itching with your medications, try taking only a single pain pill, or even half a pain pill at a time.  You can also use Benadryl over the counter for itching or also to help with sleep.   MEDICATIONS See your medication summary on the "After Visit Summary" that the nursing staff will review with you prior to discharge.  You may have some home medications which will be placed on hold until you complete the course of blood thinner medication.  It is important for you to complete the blood thinner medication as prescribed by your surgeon.  Continue your approved medications as instructed at time of discharge.  PRECAUTIONS . If you experience chest pain or shortness of   breath - call 911 immediately for transfer to the hospital emergency department.  . If you develop a fever greater that 101 F, purulent drainage from wound, increased redness or drainage from wound, foul odor from the wound/dressing, or calf pain - CONTACT YOUR SURGEON.                                                   FOLLOW-UP APPOINTMENTS Make sure you keep all of your appointments after your operation with your surgeon and caregivers. You should call the office at the above phone number and make an appointment for approximately two weeks after the date of your surgery or on the date instructed by your surgeon outlined in the "After Visit Summary".  RANGE OF MOTION AND STRENGTHENING EXERCISES  Rehabilitation of the knee is important following a knee injury or an operation. After just a few days of immobilization, the muscles of the thigh which control the knee become weakened and shrink (atrophy). Knee  exercises are designed to build up the tone and strength of the thigh muscles and to improve knee motion. Often times heat used for twenty to thirty minutes before working out will loosen up your tissues and help with improving the range of motion but do not use heat for the first two weeks following surgery. These exercises can be done on a training (exercise) mat, on the floor, on a table or on a bed. Use what ever works the best and is most comfortable for you Knee exercises include:  . Leg Lifts - While your knee is still immobilized in a splint or cast, you can do straight leg raises. Lift the leg to 60 degrees, hold for 3 sec, and slowly lower the leg. Repeat 10-20 times 2-3 times daily. Perform this exercise against resistance later as your knee gets better.  . Quad and Hamstring Sets - Tighten up the muscle on the front of the thigh (Quad) and hold for 5-10 sec. Repeat this 10-20 times hourly. Hamstring sets are done by pushing the foot backward against an object and holding for 5-10 sec. Repeat as with quad sets.   Leg Slides: Lying on your back, slowly slide your foot toward your buttocks, bending your knee up off the floor (only go as far as is comfortable). Then slowly slide your foot back down until your leg is flat on the floor again.  Angel Wings: Lying on your back spread your legs to the side as far apart as you can without causing discomfort.  A rehabilitation program following serious knee injuries can speed recovery and prevent re-injury in the future due to weakened muscles. Contact your doctor or a physical therapist for more information on knee rehabilitation.   IF YOU ARE TRANSFERRED TO A SKILLED REHAB FACILITY If the patient is transferred to a skilled rehab facility following release from the hospital, a list of the current medications will be sent to the facility for the patient to continue.  When discharged from the skilled rehab facility, please have the facility set up the  patient's Home Health Physical Therapy prior to being released. Also, the skilled facility will be responsible for providing the patient with their medications at time of release from the facility to include their pain medication, the muscle relaxants, and their blood thinner medication. If the patient is still at the   rehab facility at time of the two week follow up appointment, the skilled rehab facility will also need to assist the patient in arranging follow up appointment in our office and any transportation needs.  MAKE SURE YOU:  . Understand these instructions.  . Get help right away if you are not doing well or get worse.    Pick up stool softner and laxative for home use following surgery while on pain medications. Do not submerge incision under water. Please use good hand washing techniques while changing dressing each day. May shower starting three days after surgery. Please use a clean towel to pat the incision dry following showers. Continue to use ice for pain and swelling after surgery. Do not use any lotions or creams on the incision until instructed by your surgeon.  

## 2019-12-11 NOTE — Anesthesia Procedure Notes (Signed)
Anesthesia Regional Block: Adductor canal block   Pre-Anesthetic Checklist: ,, timeout performed, Correct Patient, Correct Site, Correct Laterality, Correct Procedure, Correct Position, site marked, Risks and benefits discussed,  Surgical consent,  Pre-op evaluation,  At surgeon's request and post-op pain management  Laterality: Lower and Left  Prep: chloraprep       Needles:  Injection technique: Single-shot  Needle Type: Echogenic Stimulator Needle     Needle Length: 10cm  Needle Gauge: 21   Needle insertion depth: 2 cm   Additional Needles:   Procedures:,,,, ultrasound used (permanent image in chart),,,,  Narrative:  Start time: 12/11/2019 7:00 AM End time: 12/11/2019 7:07 AM Injection made incrementally with aspirations every 5 mL.  Events: blood aspirated,,,,,,,,,,  Performed by: Personally  Anesthesiologist: Leilani Able, MD

## 2019-12-11 NOTE — Transfer of Care (Signed)
Immediate Anesthesia Transfer of Care Note  Patient: [name] [name]  Procedure(s) Performed: TOTAL KNEE ARTHROPLASTY (Left Knee)  Patient Location: PACU  Anesthesia Type:Spinal and MAC combined with regional for post-op pain  Level of Consciousness: awake, alert , oriented and patient cooperative  Airway & Oxygen Therapy: Patient Spontanous Breathing and Patient connected to face mask oxygen  Post-op Assessment: Report given to RN and Post -op Vital signs reviewed and stable  Post vital signs: Reviewed and stable  Last Vitals:  Vitals Value Taken Time  BP    Temp    Pulse    Resp    SpO2      Last Pain:  Vitals:   12/11/19 0626  TempSrc: Oral         Complications: No apparent anesthesia complications

## 2019-12-11 NOTE — Evaluation (Signed)
Physical Therapy Evaluation Patient Details Name: [name] [name] MRN: [id number] DOB: 01-21-1961 Today's Date: 12/11/2019   History of Present Illness  Patient is 59 y.o. male s/p Lt TKA on 12/11/19 with PMH significant for COVID-19 in 07/2019, HTN, HLD, GERD, DM, OA, anxiety.    Clinical Impression  JAYSHAWN COLSTON is a 59 y.o. male  POD 0 s/p Lt TKA. Patient reports indpendence with mobility at baseline. Patient is now limited by functional impairments (see PT problem list below) and requires min assist for transfers and gait with RW. Patient was able to ambulate ~60 feet with RW and min assist. Patient instructed in exercise to facilitate ROM and circulation. Patient will benefit from continued skilled PT interventions to address impairments and progress towards PLOF. Acute PT will follow to progress mobility and stair training in preparation for safe discharge home.     Follow Up Recommendations Follow surgeon's recommendation for DC plan and follow-up therapies;Outpatient PT(pt reports getting OPPT)    Equipment Recommendations  None recommended by PT    Recommendations for Other Services       Precautions / Restrictions Precautions Precautions: Fall Restrictions Weight Bearing Restrictions: No      Mobility  Bed Mobility Overal bed mobility: Needs Assistance Bed Mobility: Supine to Sit     Supine to sit: Supervision;HOB elevated     General bed mobility comments: cues for walking LE's to EOB, no assist required, pt able to scoot forward to bring feet on floot  Transfers Overall transfer level: Needs assistance Equipment used: Rolling walker (2 wheeled) Transfers: Sit to/from Stand Sit to Stand: Min assist;From elevated surface         General transfer comment: cues for hand placement/technique with RW, assist required to complete power up to rise.  Ambulation/Gait Ambulation/Gait assistance: Min assist Gait Distance (Feet): 60 Feet Assistive device: Rolling  walker (2 wheeled) Gait Pattern/deviations: Step-to pattern;Decreased stride length Gait velocity: decreased   General Gait Details: cues at start for safe step pattern and to maintain safe proximity to RW, no overt LOB noted however intermittent light assist provided to steady.  Stairs       Wheelchair Mobility    Modified Rankin (Stroke Patients Only)       Balance Overall balance assessment: Needs assistance Sitting-balance support: Feet supported Sitting balance-Leahy Scale: Good     Standing balance support: During functional activity;Bilateral upper extremity supported Standing balance-Leahy Scale: Poor            Pertinent Vitals/Pain Pain Assessment: No/denies pain    Home Living Family/patient expects to be discharged to:: Private residence Living Arrangements: Spouse/significant other Available Help at Discharge: Family;Available 24 hours/day Type of Home: House Home Access: Stairs to enter Entrance Stairs-Rails: Right Entrance Stairs-Number of Steps: 3 Home Layout: One level Home Equipment: Walker - 2 wheels;Cane - single point;Bedside commode;Shower seat      Prior Function Level of Independence: Independent         Comments: 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 [employer]   Dominant Hand: Right    Extremity/Trunk Assessment   Upper Extremity Assessment Upper Extremity Assessment: Overall WFL for tasks assessed    Lower Extremity Assessment Lower Extremity Assessment: LLE deficits/detail LLE Deficits / Details: limited quad activation, extensor lag noted with SLR(Rt LE able to complete SLR and 4+/5 for quad strength) LLE: Unable to fully assess due to immobilization    Cervical / Trunk Assessment Cervical / Trunk Assessment: Normal  Communication   Communication: No difficulties  Cognition Arousal/Alertness: Awake/alert Behavior During Therapy: WFL for tasks assessed/performed Overall Cognitive Status: Within  Functional Limits for tasks assessed     General Comments      Exercises Total Joint Exercises Ankle Circles/Pumps: AROM;Both;15 reps;Seated Quad Sets: AROM;Left;5 reps;Seated Heel Slides: AAROM;Left;5 reps;Seated   Assessment/Plan    PT Assessment Patient needs continued PT services  PT Problem List Decreased strength;Decreased range of motion;Decreased activity tolerance;Decreased balance;Decreased mobility;Decreased knowledge of use of DME;Decreased knowledge of precautions       PT Treatment Interventions DME instruction;Gait training;Stair training;Functional mobility training;Therapeutic activities;Therapeutic exercise;Balance training;Patient/family education    PT Goals (Current goals can be found in the Care Plan section)  Acute Rehab PT Goals Patient Stated 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 PT Goal Formulation: With patient Time For Goal Achievement: 12/18/19 Potential to Achieve Goals: Good    Frequency 7X/week    AM-PAC PT "6 Clicks" Mobility  Outcome Measure Help needed turning from your back to your side while in a flat bed without using bedrails?: None Help needed moving from lying on your back to sitting on the side of a flat bed without using bedrails?: A Little Help needed moving to and from a bed to a chair (including a wheelchair)?: A Little Help needed standing up from a chair using your arms (e.g., wheelchair or bedside chair)?: A Little Help needed to walk in hospital room?: A Little Help needed climbing 3-5 steps with a railing? : A Little 6 Click Score: 19    End of Session Equipment Utilized During Treatment: Gait belt;Left knee immobilizer Activity Tolerance: Patient tolerated treatment well Patient left: in chair;with call bell/phone within reach;with chair alarm set;with family/visitor present Nurse Communication: Mobility status PT Visit Diagnosis: Muscle weakness (generalized) (M62.81);Difficulty in walking, not elsewhere  classified (R26.2)    Time: 1202-1236 PT Time Calculation (min) (ACUTE ONLY): 34 min   Charges:   PT Evaluation $PT Eval Low Complexity: 1 Low PT Treatments $Therapeutic Exercise: 8-22 mins        Verner Mould, DPT Physical Therapist with [facility] [phone]  12/11/2019 12:49 PM

## 2019-12-11 NOTE — Plan of Care (Signed)

## 2019-12-11 NOTE — Anesthesia Procedure Notes (Signed)
Procedure Name: MAC Date/Time: 12/11/2019 7:11 AM Performed by: West Pugh, CRNA Pre-anesthesia Checklist: Patient identified, Emergency Drugs available, Suction available, Patient being monitored and Timeout performed Patient Re-evaluated:Patient Re-evaluated prior to induction Oxygen Delivery Method: Simple face mask Preoxygenation: Pre-oxygenation with 100% oxygen Placement Confirmation: positive ETCO2 Dental Injury: Teeth and Oropharynx as per pre-operative assessment

## 2019-12-12 ENCOUNTER — Encounter (HOSPITAL_COMMUNITY): Payer: Self-pay | Admitting: Orthopedic Surgery

## 2019-12-12 LAB — CBC
HCT: 34.4 % — ABNORMAL LOW (ref 39.0–52.0)
Hemoglobin: 10.4 g/dL — ABNORMAL LOW (ref 13.0–17.0)
MCH: 25.6 pg — ABNORMAL LOW (ref 26.0–34.0)
MCHC: 30.2 g/dL (ref 30.0–36.0)
MCV: 84.7 fL (ref 80.0–100.0)
Platelets: 144 10*3/uL — ABNORMAL LOW (ref 150–400)
RBC: 4.06 MIL/uL — ABNORMAL LOW (ref 4.22–5.81)
RDW: 15.9 % — ABNORMAL HIGH (ref 11.5–15.5)
WBC: 14.9 10*3/uL — ABNORMAL HIGH (ref 4.0–10.5)
nRBC: 0 % (ref 0.0–0.2)

## 2019-12-12 LAB — GLUCOSE, CAPILLARY
Glucose-Capillary: 141 mg/dL — ABNORMAL HIGH (ref 70–99)
Glucose-Capillary: 204 mg/dL — ABNORMAL HIGH (ref 70–99)

## 2019-12-12 LAB — BASIC METABOLIC PANEL
Anion gap: 7 (ref 5–15)
BUN: 14 mg/dL (ref 6–20)
CO2: 27 mmol/L (ref 22–32)
Calcium: 8.6 mg/dL — ABNORMAL LOW (ref 8.9–10.3)
Chloride: 104 mmol/L (ref 98–111)
Creatinine, Ser: 0.98 mg/dL (ref 0.61–1.24)
GFR calc Af Amer: >60 mL/min (ref >60)
GFR calc non Af Amer: >60 mL/min (ref >60)
Glucose, Bld: 161 mg/dL — ABNORMAL HIGH (ref 70–99)
Potassium: 4.4 mmol/L (ref 3.5–5.1)
Sodium: 138 mmol/L (ref 135–145)

## 2019-12-12 MED ORDER — OXYCODONE HCL 5 MG PO TABS: 5.0000 mg | ORAL_TABLET | Freq: Four times a day (QID) | ORAL | 0 refills | Status: DC | PRN

## 2019-12-12 MED ORDER — GABAPENTIN 300 MG PO CAPS: 300.0000 mg | ORAL_CAPSULE | Freq: Three times a day (TID) | ORAL | 0 refills | Status: DC

## 2019-12-12 MED ORDER — METHOCARBAMOL 500 MG PO TABS: 500.0000 mg | ORAL_TABLET | Freq: Four times a day (QID) | ORAL | 0 refills | Status: DC | PRN

## 2019-12-12 MED ORDER — ASPIRIN 325 MG PO TBEC: 325.0000 mg | DELAYED_RELEASE_TABLET | Freq: Two times a day (BID) | ORAL | 0 refills | Status: AC

## 2019-12-12 NOTE — Progress Notes (Signed)
Physical Therapy Treatment Patient Details Name: [name] [name] MRN: [id number] DOB: 02/09/1961 Today's Date: 12/12/2019    History of Present Illness Patient is 59 y.o. male s/p Lt TKA on 12/11/19 with PMH significant for COVID-19 in 07/2019, HTN, HLD, GERD, DM, OA, anxiety.    PT Comments    POD # 1 am session Assisted OOB.  General bed mobility comments: increased time and demonstarted/educated how to use belt loop to self assist LE off bed.  Assisted with amb in hallway.  General transfer comment: 25% VC's on proper hand placement, to extend LE prior to sit and safety with turns. General Gait Details: 25% VC's on proper sequencing, proper walker to self distance and upright posture.Then returned to room to perform some TE's following HEP handout.  Instructed on proper tech, freq as well as use of ICE.   Pt will need another PT session to complete HEP and address stairs with spouse.   Follow Up Recommendations  No PT follow up;Outpatient PT     Equipment Recommendations  None recommended by PT    Recommendations for Other Services       Precautions / Restrictions Precautions Precautions: Fall;Knee Precaution Comments: instructed no pillow under knee Restrictions Weight Bearing Restrictions: No Other Position/Activity Restrictions: WBAT    Mobility  Bed Mobility Overal bed mobility: Needs Assistance Bed Mobility: Supine to Sit     Supine to sit: Supervision;Min guard     General bed mobility comments: increased time and demonstarted/educated how to use belt loop to self assist LE off bed  Transfers Overall transfer level: Needs assistance Equipment used: Rolling walker (2 wheeled) Transfers: Sit to/from Stand Sit to Stand: Supervision;Min guard         General transfer comment: 25% VC's on proper hand placement, to extend LE prior to sit and safety with turns.  Ambulation/Gait Ambulation/Gait assistance: Supervision;Min guard Gait Distance (Feet): 55  Feet Assistive device: Rolling walker (2 wheeled) Gait Pattern/deviations: Step-to pattern;Decreased stride length Gait velocity: decreased   General Gait Details: 25% VC's on proper sequencing, proper walker to self distance and upright posture.   Stairs             Wheelchair Mobility    Modified Rankin (Stroke Patients Only)       Balance                                            Cognition Arousal/Alertness: Awake/alert Behavior During Therapy: WFL for tasks assessed/performed Overall Cognitive Status: Within Functional Limits for tasks assessed                                 General Comments: AxO x 4 pleasant      Exercises   Total Knee Replacement TE's 10 reps B LE ankle pumps 10 reps towel squeezes 10 reps knee presses 10 reps heel slides  10 reps SAQ's 10 reps SLR's 10 reps ABD Followed by ICE     General Comments        Pertinent Vitals/Pain Pain Assessment: 0-10 Pain Score: 4  Pain Location: L knee Pain Descriptors / Indicators: Tightness;Discomfort;Operative site guarding;Tender Pain Intervention(s): Monitored during session;Premedicated before session;Repositioned;Ice applied    Home Living  Prior Function            PT Goals (current goals can now be found in the care plan section) Progress towards PT goals: Progressing toward goals    Frequency    7X/week      PT Plan Current plan remains appropriate    Co-evaluation              AM-PAC PT "6 Clicks" Mobility   Outcome Measure  Help needed turning from your back to your side while in a flat bed without using bedrails?: A Little Help needed moving from lying on your back to sitting on the side of a flat bed without using bedrails?: A Little Help needed moving to and from a bed to a chair (including a wheelchair)?: A Little Help needed standing up from a chair using your arms (e.g., wheelchair or  bedside chair)?: A Little Help needed to walk in hospital room?: A Little Help needed climbing 3-5 steps with a railing? : A Little 6 Click Score: 18    End of Session Equipment Utilized During Treatment: Gait belt Activity Tolerance: Patient tolerated treatment well Patient left: in chair;with call bell/phone within reach;with chair alarm set Nurse Communication: Mobility status(pt will need PT sessions today) PT Visit Diagnosis: Muscle weakness (generalized) (M62.81);Difficulty in walking, not elsewhere classified (R26.2)     Time: 9233-0076 PT Time Calculation (min) (ACUTE ONLY): 25 min  Charges:  $Gait Training: 8-22 mins $Therapeutic Exercise: 8-22 mins                     Felecia Shelling  PTA Acute  Rehabilitation Services Pager      [phone] Office      [phone]

## 2019-12-12 NOTE — Progress Notes (Signed)
Physical Therapy Treatment Patient Details Name: [name] [name] MRN: [id number] DOB: 02/01/1961 Today's Date: 12/12/2019    History of Present Illness Patient is 58 y.o. male s/p Lt TKA on 12/11/19 with PMH significant for COVID-19 in 07/2019, HTN, HLD, GERD, DM, OA, anxiety.    PT Comments    POD # 1 pm session. Spouse Mary present for education.  Assisted with amb in hallway, practiced stairs then assisted back to bed per pt request to rest prior to D/C.  General stair comments: with spouse Mary present for "hands on" instruction on proper safe handling.  Pt practiced 2 steps one rail using a cane opposite of rail with 50% VC's on proper sequencing.  Perfomed well. Revived handout HEP with spouse.  Applied ICE Pt has met goals to D/C to home today.    Follow Up Recommendations  No PT follow up;Outpatient PT     Equipment Recommendations  None recommended by PT    Recommendations for Other Services       Precautions / Restrictions Precautions Precautions: Fall;Knee Precaution Comments: instructed no pillow under knee Restrictions Weight Bearing Restrictions: No Other Position/Activity Restrictions: WBAT    Mobility  Bed Mobility Overal bed mobility: Needs Assistance Bed Mobility: Sit to Supine     Supine to sit: Supervision;Min guard Sit to supine: Min assist   General bed mobility comments: assisted back to bed per pt request to rest prior to going home  Transfers Overall transfer level: Needs assistance Equipment used: Rolling walker (2 wheeled) Transfers: Sit to/from Stand Sit to Stand: Supervision;Min guard         General transfer comment: 25% VC's on proper hand placement, to extend LE prior to sit and safety with turns.  Ambulation/Gait Ambulation/Gait assistance: Supervision;Min guard Gait Distance (Feet): 28 Feet Assistive device: Rolling walker (2 wheeled) Gait Pattern/deviations: Step-to pattern;Decreased stride length Gait velocity: decreased    General Gait Details: 25% VC's on proper sequencing, proper walker to self distance and upright posture. Decreased amb distance to focus on stairs.   Stairs Stairs: Yes Stairs assistance: Supervision;Min guard Stair Management: One rail Right;Step to pattern;Forwards;With cane Number of Stairs: 2 General stair comments: with spouse Mary present for "hands on" instruction on proper safe handling.  Pt practiced 2 steps one rail using a cane opposite of rail with 50% VC's on proper sequencing.  Perfomed well.   Wheelchair Mobility    Modified Rankin (Stroke Patients Only)       Balance                                            Cognition Arousal/Alertness: Awake/alert Behavior During Therapy: WFL for tasks assessed/performed Overall Cognitive Status: Within Functional Limits for tasks assessed                                 General Comments: AxO x 4 pleasant      Exercises      General Comments        Pertinent Vitals/Pain Pain Assessment: 0-10 Pain Score: 8  Pain Location: L knee Pain Descriptors / Indicators: Tightness;Discomfort;Operative site guarding;Tender Pain Intervention(s): Monitored during session;Premedicated before session;Repositioned;Ice applied    Home Living                        Prior Function            PT Goals (current goals can now be found in the care plan section) Progress towards PT goals: Progressing toward goals    Frequency    7X/week      PT Plan Current plan remains appropriate    Co-evaluation              AM-PAC PT "6 Clicks" Mobility   Outcome Measure  Help needed turning from your back to your side while in a flat bed without using bedrails?: A Little Help needed moving from lying on your back to sitting on the side of a flat bed without using bedrails?: A Little Help needed moving to and from a bed to a chair (including a wheelchair)?: A Little Help needed standing  up from a chair using your arms (e.g., wheelchair or bedside chair)?: A Little Help needed to walk in hospital room?: A Little Help needed climbing 3-5 steps with a railing? : A Little 6 Click Score: 18    End of Session Equipment Utilized During Treatment: Gait belt Activity Tolerance: Patient tolerated treatment well Patient left: in bed Nurse Communication: (pt has completed 2 PT sessions but plans to D/C to home closer to 4pm awaiting on another family memberr to help him get into house.) PT Visit Diagnosis: Muscle weakness (generalized) (M62.81);Difficulty in walking, not elsewhere classified (R26.2)     Time: 1130-1155 PT Time Calculation (min) (ACUTE ONLY): 25 min  Charges:  $Gait Training: 8-22 mins $Therapeutic Activity: 8-22 mins                     Rica Koyanagi  PTA Acute  Rehabilitation Services Pager      [phone] Office      [phone]

## 2019-12-12 NOTE — Progress Notes (Signed)
Subjective: 1 Day Post-Op Procedure(s) (LRB): TOTAL KNEE ARTHROPLASTY (Left) Patient reports pain as mild.   Patient seen in rounds by Dr. Wynelle Link. Patient is well, and has had no acute complaints or problems other than pain in the left knee. No issues overnight. Denies chest pain, SOB, or calf pain. Foley catheter to be removed this AM. We will continue therapy today, ambulated 61' yesterday.   Objective: Vital signs in last 24 hours: Temp:  [97.6 F (36.4 C)-98.1 F (36.7 C)] 97.6 F (36.4 C) (03/23 1740) Pulse Rate:  [61-88] 64 (03/23 0614) Resp:  [14-20] 20 (03/23 0614) BP: (112-139)/(73-93) 134/87 (03/23 0614) SpO2:  [96 %-100 %] 96 % (03/23 0614) Weight:  [87.5 kg] 87.5 kg (03/22 1010)  Intake/Output from previous day:  Intake/Output Summary (Last 24 hours) at 12/12/2019 0715 Last data filed at 12/12/2019 8144 Gross per 24 hour  Intake 4799.27 ml  Output 3195 ml  Net 1604.27 ml     Intake/Output this shift: No intake/output data recorded.  Labs: Recent Labs    12/12/19 0302  HGB 10.4*   Recent Labs    12/12/19 0302  WBC 14.9*  RBC 4.06*  HCT 34.4*  PLT 144*   Recent Labs    12/12/19 0302  NA 138  K 4.4  CL 104  CO2 27  BUN 14  CREATININE 0.98  GLUCOSE 161*  CALCIUM 8.6*   Exam: General - Patient is Alert and Oriented Extremity - Neurologically intact Neurovascular intact Sensation intact distally Dorsiflexion/Plantar flexion intact Dressing - dressing C/D/I Motor Function - intact, moving foot and toes well on exam.   Past Medical History:  Diagnosis Date  . Anxiety   . Arthritis   . Diabetes mellitus without complication ([place])   . Diverticulosis    pt unaware  . ED (erectile dysfunction)   . Fatty liver   . GERD (gastroesophageal reflux disease)   . History of COVID-19 07/2019  . History of left inguinal hernia   . History of retinal detachment    Right  . Hyperlipidemia   . Hypertension   . Insomnia   . Low ferritin level    . Numbness and tingling of both feet   . Shortness of breath dyspnea    since having COVID 07/2019 with exertions    Assessment/Plan: 1 Day Post-Op Procedure(s) (LRB): TOTAL KNEE ARTHROPLASTY (Left) Principal Problem:   OA (osteoarthritis) of knee Active Problems:   Osteoarthritis of left knee  Estimated body mass index is 25.45 kg/m as calculated from the following:   Height as of this encounter: 6\' 1"  (1.854 m).   Weight as of this encounter: 87.5 kg. Advance diet Up with therapy D/C IV fluids  Anticipated LOS equal to or greater than 2 midnights due to - Age 69 and older with one or more of the following:  - Obesity  - Expected need for hospital services (PT, OT, Nursing) required for safe  discharge  - Anticipated need for postoperative skilled nursing care or inpatient rehab  - Active co-morbidities: Diabetes OR   - Unanticipated findings during/Post Surgery: None  - Patient is a high risk of re-admission due to: None    DVT Prophylaxis - Aspirin Weight bearing as tolerated. D/C O2 and pulse ox and try on room air. Hemovac pulled without difficulty, will continue therapy today.  Plan is to go Home after hospital stay. Plan for discharge later today if meeting goals with therapy. Scheduled for outpatient therapy at [facility] in [place].  Follow-up in the office in 2 weeks.   Arther Abbott, PA-C Orthopedic Surgery [phone] 12/12/2019, 7:15 AM

## 2019-12-13 NOTE — Discharge Summary (Signed)
Physician Discharge Summary   Patient ID: [name] MRN: [id number] DOB/AGE: 04-09-1961 59 y.o.  Admit date: 12/11/2019 Discharge date: 12/12/2019  Primary Diagnosis: Osteoarthritis, left knee   Admission Diagnoses:  Past Medical History:  Diagnosis Date  . Anxiety   . Arthritis   . Diabetes mellitus without complication ([place])   . Diverticulosis    pt unaware  . ED (erectile dysfunction)   . Fatty liver   . GERD (gastroesophageal reflux disease)   . History of COVID-19 07/2019  . History of left inguinal hernia   . History of retinal detachment    Right  . Hyperlipidemia   . Hypertension   . Insomnia   . Low ferritin level   . Numbness and tingling of both feet   . Shortness of breath dyspnea    since having COVID 07/2019 with exertions   Discharge Diagnoses:   Principal Problem:   OA (osteoarthritis) of knee Active Problems:   Osteoarthritis of left knee  Estimated body mass index is 25.45 kg/m as calculated from the following:   Height as of this encounter: '6\' 1"'$  (1.854 m).   Weight as of this encounter: 87.5 kg.  Procedure:  Procedure(s) (LRB): TOTAL KNEE ARTHROPLASTY (Left)   Consults: None  HPI: [name] [name] is a 59 y.o. year old male with end stage OA of his left knee with progressively worsening pain and dysfunction. He has constant pain, with activity and at rest and significant functional deficits with difficulties even with ADLs. He has had extensive non-op management including analgesics, injections of cortisone and viscosupplements, and home exercise program, but remains in significant pain with significant dysfunction. Radiographs show bone on bone arthritis medial and patellofemoral. He presents now for left Total Knee Arthroplasty  Laboratory Data: Admission on 12/11/2019, Discharged on 12/12/2019  Component Date Value Ref Range Status  . Glucose-Capillary 12/11/2019 131* 70 - 99 mg/dL Final   Glucose reference range applies only to samples  taken after fasting for at least 8 hours.  . Comment 1 12/11/2019 Notify RN   Final  . Comment 2 12/11/2019 Document in Chart   Final  . Glucose-Capillary 12/11/2019 137* 70 - 99 mg/dL Final   Glucose reference range applies only to samples taken after fasting for at least 8 hours.  . Glucose-Capillary 12/11/2019 167* 70 - 99 mg/dL Final   Glucose reference range applies only to samples taken after fasting for at least 8 hours.  . WBC 12/12/2019 14.9* 4.0 - 10.5 K/uL Final  . RBC 12/12/2019 4.06* 4.22 - 5.81 MIL/uL Final  . Hemoglobin 12/12/2019 10.4* 13.0 - 17.0 g/dL Final  . HCT 12/12/2019 34.4* 39.0 - 52.0 % Final  . MCV 12/12/2019 84.7  80.0 - 100.0 fL Final  . MCH 12/12/2019 25.6* 26.0 - 34.0 pg Final  . MCHC 12/12/2019 30.2  30.0 - 36.0 g/dL Final  . RDW 12/12/2019 15.9* 11.5 - 15.5 % Final  . Platelets 12/12/2019 144* 150 - 400 K/uL Final  . nRBC 12/12/2019 0.0  0.0 - 0.2 % Final   Performed at [facility], [place] [address], [place], [place] [address]  . Sodium 12/12/2019 138  135 - 145 mmol/L Final  . Potassium 12/12/2019 4.4  3.5 - 5.1 mmol/L Final  . Chloride 12/12/2019 104  98 - 111 mmol/L Final  . CO2 12/12/2019 27  22 - 32 mmol/L Final  . Glucose, Bld 12/12/2019 161* 70 - 99 mg/dL Final   Glucose reference range applies  only to samples taken after fasting for at least 8 hours.  . BUN 12/12/2019 14  6 - 20 mg/dL Final  . Creatinine, Ser 12/12/2019 0.98  0.61 - 1.24 mg/dL Final  . Calcium 12/12/2019 8.6* 8.9 - 10.3 mg/dL Final  . GFR calc non Af Amer 12/12/2019 >60  >60 mL/min Final  . GFR calc Af Amer 12/12/2019 >60  >60 mL/min Final  . Anion gap 12/12/2019 7  5 - 15 Final   Performed at [facility], [place] [address] [place], [place] [address]  . Glucose-Capillary 12/11/2019 300* 70 - 99 mg/dL Final   Glucose reference range applies only to samples taken after fasting for at least 8 hours.  . Glucose-Capillary 12/11/2019 286* 70 - 99  mg/dL Final   Glucose reference range applies only to samples taken after fasting for at least 8 hours.  . Glucose-Capillary 12/12/2019 141* 70 - 99 mg/dL Final   Glucose reference range applies only to samples taken after fasting for at least 8 hours.  . Glucose-Capillary 12/12/2019 204* 70 - 99 mg/dL Final   Glucose reference range applies only to samples taken after fasting for at least 8 hours.  Hospital Outpatient Visit on 12/07/2019  Component Date Value Ref Range Status  . SARS Coronavirus 2 12/07/2019 NEGATIVE  NEGATIVE Final   Comment: (NOTE) SARS-CoV-2 target nucleic acids are NOT DETECTED. The SARS-CoV-2 RNA is generally detectable in upper and lower respiratory specimens during the acute phase of infection. Negative results do not preclude SARS-CoV-2 infection, do not rule out co-infections with other pathogens, and should not be used as the sole basis for treatment or other patient management decisions. Negative results must be combined with clinical observations, patient history, and epidemiological information. The expected result is Negative. Fact Sheet for Patients: SugarRoll.be Fact Sheet for Healthcare Providers: https://www.woods-mathews.com/ This test is not yet approved or cleared by the [place] FDA and  has been authorized for detection and/or diagnosis of SARS-CoV-2 by FDA under an Emergency Use Authorization (EUA). This EUA will remain  in effect (meaning this test can be used) for the duration of the COVID-19 declaration under Section 56                          4(b)(1) of the Act, 21 U.S.C. section 360bbb-3(b)(1), unless the authorization is terminated or revoked sooner. Performed at [facility], [place] [address] [place], [place] [address]   Hospital Outpatient Visit on 12/04/2019  Component Date Value Ref Range Status  . MRSA, PCR 12/04/2019 NEGATIVE  NEGATIVE Final  . Staphylococcus aureus 12/04/2019  NEGATIVE  NEGATIVE Final   Comment: (NOTE) The Xpert SA Assay (FDA approved for NASAL specimens in patients 48 years of age and older), is one component of a comprehensive surveillance program. It is not intended to diagnose infection nor to guide or monitor treatment. Performed at [facility], [place] [address], [place], [place] [address]   . aPTT 12/04/2019 35  24 - 36 seconds Final   Performed at [facility], [place] [address] [place], [place] [address]  . WBC 12/04/2019 8.2  4.0 - 10.5 K/uL Final  . RBC 12/04/2019 4.77  4.22 - 5.81 MIL/uL Final  . Hemoglobin 12/04/2019 11.9* 13.0 - 17.0 g/dL Final  . HCT 12/04/2019 39.2  39.0 - 52.0 % Final  . MCV 12/04/2019 82.2  80.0 - 100.0 fL Final  . MCH 12/04/2019 24.9* 26.0 -  34.0 pg Final  . MCHC 12/04/2019 30.4  30.0 - 36.0 g/dL Final  . RDW 12/04/2019 15.1  11.5 - 15.5 % Final  . Platelets 12/04/2019 196  150 - 400 K/uL Final  . nRBC 12/04/2019 0.0  0.0 - 0.2 % Final   Performed at [facility], [place] [address] [place], [place] [address]  . Sodium 12/04/2019 140  135 - 145 mmol/L Final  . Potassium 12/04/2019 3.7  3.5 - 5.1 mmol/L Final  . Chloride 12/04/2019 102  98 - 111 mmol/L Final  . CO2 12/04/2019 27  22 - 32 mmol/L Final  . Glucose, Bld 12/04/2019 188* 70 - 99 mg/dL Final   Glucose reference range applies only to samples taken after fasting for at least 8 hours.  . BUN 12/04/2019 14  6 - 20 mg/dL Final  . Creatinine, Ser 12/04/2019 0.98  0.61 - 1.24 mg/dL Final  . Calcium 12/04/2019 9.7  8.9 - 10.3 mg/dL Final  . Total Protein 12/04/2019 6.4* 6.5 - 8.1 g/dL Final  . Albumin 12/04/2019 3.7  3.5 - 5.0 g/dL Final  . AST 12/04/2019 15  15 - 41 U/L Final  . ALT 12/04/2019 16  0 - 44 U/L Final  . Alkaline Phosphatase 12/04/2019 52  38 - 126 U/L Final  . Total Bilirubin 12/04/2019 0.9  0.3 - 1.2 mg/dL Final  . GFR calc non Af Amer 12/04/2019 >60  >60 mL/min Final  . GFR calc Af  Amer 12/04/2019 >60  >60 mL/min Final  . Anion gap 12/04/2019 11  5 - 15 Final   Performed at [facility], [place] [address], [place], [place] [address]  . Prothrombin Time 12/04/2019 14.6  11.4 - 15.2 seconds Final  . INR 12/04/2019 1.2  0.8 - 1.2 Final   Comment: (NOTE) INR goal varies based on device and disease states. Performed at [facility], [place] [address] [place], [place] [address]   . ABO/RH(D) 12/04/2019 O NEG   Final  . Antibody Screen 12/04/2019 NEG   Final  . Sample Expiration 12/04/2019 12/14/2019,2359   Final  . Extend sample reason 12/04/2019    Final                   Value:NO TRANSFUSIONS OR PREGNANCY IN THE PAST 3 MONTHS Performed at [facility], [place] [address], [place], [place] [address]   . Glucose-Capillary 12/04/2019 186* 70 - 99 mg/dL Final   Glucose reference range applies only to samples taken after fasting for at least 8 hours.  . ABO/RH(D) 12/04/2019    Final                   Value:O NEG Performed at [facility], [place] [address] [place], [place] [address]   Office Visit on 11/08/2019  Component Date Value Ref Range Status  . WBC 11/13/2019 6.6  3.4 - 10.8 x10E3/uL Final  . RBC 11/13/2019 4.78  4.14 - 5.80 x10E6/uL Final  . Hemoglobin 11/13/2019 11.9* 13.0 - 17.7 g/dL Final  . Hematocrit 11/13/2019 38.4  37.5 - 51.0 % Final  . MCV 11/13/2019 80  79 - 97 fL Final  . MCH 11/13/2019 24.9* 26.6 - 33.0 pg Final  . MCHC 11/13/2019 31.0* 31.5 - 35.7 g/dL Final  . RDW 11/13/2019 14.5  11.6 - 15.4 % Final  . Platelets 11/13/2019 215  150 - 450 x10E3/uL Final  . Neutrophils 11/13/2019 66  Not Estab. %  Final  . Lymphs 11/13/2019 25  Not Estab. % Final  . Monocytes 11/13/2019 7  Not Estab. % Final  . Eos 11/13/2019 1  Not Estab. % Final  . Basos 11/13/2019 1  Not Estab. % Final  . Neutrophils Absolute 11/13/2019 4.3  1.4 - 7.0 x10E3/uL Final  . Lymphocytes Absolute 11/13/2019 1.7  0.7  - 3.1 x10E3/uL Final  . Monocytes Absolute 11/13/2019 0.5  0.1 - 0.9 x10E3/uL Final  . EOS (ABSOLUTE) 11/13/2019 0.1  0.0 - 0.4 x10E3/uL Final  . Basophils Absolute 11/13/2019 0.0  0.0 - 0.2 x10E3/uL Final  . Immature Granulocytes 11/13/2019 0  Not Estab. % Final  . Immature Grans (Abs) 11/13/2019 0.0  0.0 - 0.1 x10E3/uL Final  . Prostate Specific Ag, Serum 11/13/2019 0.3  0.0 - 4.0 ng/mL Final   Comment: Roche ECLIA methodology. According to the American Urological Association, Serum PSA should decrease and remain at undetectable levels after radical prostatectomy. The AUA defines biochemical recurrence as an initial PSA value 0.2 ng/mL or greater followed by a subsequent confirmatory PSA value 0.2 ng/mL or greater. Values obtained with different assay methods or kits cannot be used interchangeably. Results cannot be interpreted as absolute evidence of the presence or absence of malignant disease.   . Cholesterol, Total 11/13/2019 105  100 - 199 mg/dL Final  . Triglycerides 11/13/2019 121  0 - 149 mg/dL Final  . HDL 11/13/2019 38* >39 mg/dL Final  . VLDL Cholesterol Cal 11/13/2019 22  5 - 40 mg/dL Final  . LDL Chol Calc (NIH) 11/13/2019 45  0 - 99 mg/dL Final  . Chol/HDL Ratio 11/13/2019 2.8  0.0 - 5.0 ratio Final   Comment:                                   T. Chol/HDL Ratio                                             Men  Women                               1/2 Avg.Risk  3.4    3.3                                   Avg.Risk  5.0    4.4                                2X Avg.Risk  9.6    7.1                                3X Avg.Risk 23.4   11.0   . Total Protein 11/13/2019 6.4  6.0 - 8.5 g/dL Final  . Albumin 11/13/2019 4.1  3.8 - 4.9 g/dL Final  . Bilirubin Total 11/13/2019 0.8  0.0 - 1.2 mg/dL Final  . Bilirubin, Direct 11/13/2019 0.26  0.00 - 0.40 mg/dL Final  . Alkaline Phosphatase 11/13/2019 67  39 - 117 IU/L Final  . AST 11/13/2019 14  0 - 40 IU/L Final  .  ALT 11/13/2019  12  0 - 44 IU/L Final  . Glucose 11/13/2019 135* 65 - 99 mg/dL Final  . BUN 11/13/2019 13  8 - 27 mg/dL Final  . Creatinine, Ser 11/13/2019 0.91  0.76 - 1.27 mg/dL Final  . GFR calc non Af Amer 11/13/2019 91  >59 mL/min/1.73 Final  . GFR calc Af Amer 11/13/2019 106  >59 mL/min/1.73 Final  . BUN/Creatinine Ratio 11/13/2019 14  10 - 24 Final  . Sodium 11/13/2019 141  134 - 144 mmol/L Final  . Potassium 11/13/2019 4.4  3.5 - 5.2 mmol/L Final  . Chloride 11/13/2019 103  96 - 106 mmol/L Final  . CO2 11/13/2019 23  20 - 29 mmol/L Final  . Calcium 11/13/2019 10.2  8.6 - 10.2 mg/dL Final  . Hgb A1c MFr Bld 11/13/2019 6.3* 4.8 - 5.6 % Final   Comment:          Prediabetes: 5.7 - 6.4          Diabetes: >6.4          Glycemic control for adults with diabetes: <7.0   . Est. average glucose Bld gHb Est-m* 11/13/2019 134  mg/dL Final  . Creatinine, Urine 11/13/2019 206.5  Not Estab. mg/dL Final  . Microalbumin, Urine 11/13/2019 11.7  Not Estab. ug/mL Final  . Microalb/Creat Ratio 11/13/2019 6  0 - 29 mg/g creat Final   Comment:                        Normal:                0 -  29                        Moderately increased: 30 - 300                        Severely increased:       >300   . Ferritin 11/22/2019 10* 30 - 400 ng/mL Final  . Total Iron Binding Capacity 11/22/2019 370  250 - 450 ug/dL Final  . UIBC 11/22/2019 332  111 - 343 ug/dL Final  . Iron 11/22/2019 38  38 - 169 ug/dL Final  . Iron Saturation 11/22/2019 10* 15 - 55 % Final     X-Rays:No results found.  EKG: Orders placed or performed during the hospital encounter of 12/04/19  . EKG 12 lead  . EKG 12 lead     Hospital Course: [name] [name] is a 59 y.o. who was admitted to [facility]. They were brought to the operating room on 12/11/2019 and underwent Procedure(s): TOTAL KNEE ARTHROPLASTY.  Patient tolerated the procedure well and was later transferred to the recovery room and then to the orthopaedic floor for  postoperative care. They were given PO and IV analgesics for pain control following their surgery. They were given 24 hours of postoperative antibiotics of  Anti-infectives (From admission, onward)   Start     Dose/Rate Route Frequency Ordered Stop   12/11/19 1400  ceFAZolin (ANCEF) IVPB 2g/100 mL premix     2 g 200 mL/hr over 30 Minutes Intravenous Every 6 hours 12/11/19 1017 12/11/19 2011   12/11/19 0600  ceFAZolin (ANCEF) IVPB 2g/100 mL premix     2 g 200 mL/hr over 30 Minutes Intravenous On call to O.R. 12/11/19 0535 12/11/19 0756   12/11/19 0540  ceFAZolin (ANCEF) 2-4 GM/100ML-%  IVPB    Note to Pharmacy: Charmayne Sheer   : cabinet override      12/11/19 0540 12/11/19 0740     and started on DVT prophylaxis in the form of Aspirin.   PT and OT were ordered for total joint protocol. Discharge planning consulted to help with postop disposition and equipment needs.  Patient had a good night on the evening of surgery. They started to get up OOB with therapy on POD #0. Pt was seen during rounds and was ready to go home pending progress with therapy. Hemovac drain was pulled without difficulty. He worked with therapy on POD #1 and was meeting his goals. Pt was discharged to home later that day in stable condition.  Diet: Diabetic diet Activity: WBAT Follow-up: in 2 weeks Disposition: Home with outpatient physical therapy at [facility] in [place] Discharged Condition: stable   Discharge Instructions    Call MD / Call 911   Complete by: As directed    If you experience chest pain or shortness of breath, CALL 911 and be transported to the hospital emergency room.  If you develope a fever above 101 F, pus (white drainage) or increased drainage or redness at the wound, or calf pain, call your surgeon's office.   Change dressing   Complete by: As directed    You may remove the bulky bandage (ACE wrap and gauze) two days after surgery. You will have an adhesive waterproof bandage underneath. Leave  this in place until your first follow-up appointment.   Constipation Prevention   Complete by: As directed    Drink plenty of fluids.  Prune juice may be helpful.  You may use a stool softener, such as Colace (over the counter) 100 mg twice a day.  Use MiraLax (over the counter) for constipation as needed.   Diet - low sodium heart healthy   Complete by: As directed    Do not put a pillow under the knee. Place it under the heel.   Complete by: As directed    Driving restrictions   Complete by: As directed    No driving for two weeks   TED hose   Complete by: As directed    Use stockings (TED hose) for three weeks on both leg(s).  You may remove them at night for sleeping.   Weight bearing as tolerated   Complete by: As directed      Allergies as of 12/12/2019      Reactions   Dilaudid [hydromorphone Hcl] Other (See Comments)   PT SAID HE PASSED OUT WHEN HE TOOK IT      Medication List    STOP taking these medications   ferrous sulfate 325 (65 FE) MG tablet   naproxen sodium 220 MG tablet Commonly known as: ALEVE     TAKE these medications   albuterol 108 (90 Base) MCG/ACT inhaler Commonly known as: VENTOLIN HFA Inhale 2 puffs into the lungs every 6 (six) hours as needed for wheezing or shortness of breath.   aspirin 325 MG EC tablet Take 1 tablet (325 mg total) by mouth 2 (two) times daily for 20 days. Then take one 81 mg aspirin once a day for three weeks. Then discontinue aspirin.   atorvastatin 20 MG tablet Commonly known as: LIPITOR Take 1 tablet (20 mg total) by mouth at bedtime.   blood glucose meter kit and supplies Kit Dispense based on patient and insurance preference. Test sugar up to one time a day   dutasteride  0.5 MG capsule Commonly known as: AVODART TAKE 1 CAPSULE DAILY. What changed:   how much to take  how to take this  when to take this  additional instructions   escitalopram 10 MG tablet Commonly known as: Lexapro Take one tablet po  each morning What changed:   how much to take  how to take this  when to take this  additional instructions   fluticasone 50 MCG/ACT nasal spray Commonly known as: FLONASE USE 2 SPRAYS IN EACH NOSTRIL EVERY DAY, AS NEEDED. What changed:   how much to take  how to take this  when to take this  reasons to take this  additional instructions   gabapentin 300 MG capsule Commonly known as: NEURONTIN Take 1 capsule (300 mg total) by mouth 3 (three) times daily. Take a 300 mg capsule three times a day for two weeks following surgery.Then take a 300 mg capsule two times a day for two weeks. Then take a 300 mg capsule once a day for two weeks. Then discontinue.   glipiZIDE 5 MG tablet Commonly known as: GLUCOTROL TAKE 2 TABLETS TWICE DAILY BEFORE A MEAL. What changed:   how much to take  how to take this  when to take this  additional instructions   LORazepam 1 MG tablet Commonly known as: ATIVAN Take one tablet po qhs prn anxiety or sleep What changed:   how much to take  how to take this  when to take this  additional instructions   losartan 50 MG tablet Commonly known as: COZAAR Take 1 tablet (50 mg total) by mouth daily.   metFORMIN 500 MG tablet Commonly known as: GLUCOPHAGE Take 2 tablets (1,000 mg total) by mouth 2 (two) times daily.   methocarbamol 500 MG tablet Commonly known as: ROBAXIN Take 1 tablet (500 mg total) by mouth every 6 (six) hours as needed for muscle spasms.   omeprazole 20 MG capsule Commonly known as: PRILOSEC TAKE 1 CAPSULE TWICE DAILY BEFORE MEALS., What changed:   how much to take  how to take this  when to take this  additional instructions   OneTouch Delica Lancets 67H Misc   OneTouch Verio test strip Generic drug: glucose blood   oxyCODONE 5 MG immediate release tablet Commonly known as: Oxy IR/ROXICODONE Take 1-2 tablets (5-10 mg total) by mouth every 6 (six) hours as needed for moderate pain or severe  pain.   sildenafil 20 MG tablet Commonly known as: REVATIO TAKE 2 OR 3 TABLETS BY MOUTH 2 HOURS BEFORE SEX. What changed: See the new instructions.   tamsulosin 0.4 MG Caps capsule Commonly known as: FLOMAX Take 2 capsules (0.8 mg total) by mouth at bedtime.            Discharge Care Instructions  (From admission, onward)         Start     Ordered   12/12/19 0000  Weight bearing as tolerated     12/12/19 0723   12/12/19 0000  Change dressing    Comments: You may remove the bulky bandage (ACE wrap and gauze) two days after surgery. You will have an adhesive waterproof bandage underneath. Leave this in place until your first follow-up appointment.   12/12/19 4193         Follow-up Information    Gaynelle Arabian, MD. Schedule an appointment as soon as possible for a visit on 12/26/2019.   Specialty: Orthopedic Surgery Contact information: [address] [place] [address] [phone]  Signed: Theresa Duty, PA-C Orthopedic Surgery 12/13/2019, 9:51 AM

## 2019-12-18 ENCOUNTER — Ambulatory Visit (HOSPITAL_COMMUNITY): Payer: BC Managed Care – PPO | Attending: Orthopedic Surgery | Admitting: Physical Therapy

## 2019-12-18 ENCOUNTER — Encounter (HOSPITAL_COMMUNITY): Payer: Self-pay | Admitting: Physical Therapy

## 2019-12-18 ENCOUNTER — Other Ambulatory Visit: Payer: Self-pay

## 2019-12-18 DIAGNOSIS — R29898 Other symptoms and signs involving the musculoskeletal system: Secondary | ICD-10-CM

## 2019-12-18 DIAGNOSIS — M25562 Pain in left knee: Secondary | ICD-10-CM | POA: Insufficient documentation

## 2019-12-18 DIAGNOSIS — M6281 Muscle weakness (generalized): Secondary | ICD-10-CM | POA: Insufficient documentation

## 2019-12-18 DIAGNOSIS — R2689 Other abnormalities of gait and mobility: Secondary | ICD-10-CM | POA: Diagnosis not present

## 2019-12-18 NOTE — Therapy (Signed)
[facility] [address] [place], [place], [address] Phone: [phone]   Fax:  [phone]  Physical Therapy Evaluation  Patient Details  Name: ROWE WARMAN MRN: [id number] Date of Birth: 08-15-1961 Referring Provider (PT): Ollen Gross MD   Encounter Date: 12/18/2019  PT End of Session - 12/18/19 1056    Visit Number  1    Number of Visits  18    Date for PT Re-Evaluation  01/29/20    Authorization Type  BSBS (30 visit limit no auth)    Progress Note Due on Visit  10    PT Start Time  [phone]    PT Stop Time  1030    PT Time Calculation (min)  44 min    Activity Tolerance  Patient tolerated treatment well;Patient limited by pain    Behavior During Therapy  [facility] for tasks assessed/performed       Past Medical History:  Diagnosis Date  . Anxiety   . Arthritis   . Diabetes mellitus without complication (HCC)   . Diverticulosis    pt unaware  . ED (erectile dysfunction)   . Fatty liver   . GERD (gastroesophageal reflux disease)   . History of COVID-19 07/2019  . History of left inguinal hernia   . History of retinal detachment    Right  . Hyperlipidemia   . Hypertension   . Insomnia   . Low ferritin level   . Numbness and tingling of both feet   . Shortness of breath dyspnea    since having COVID 07/2019 with exertions    Past Surgical History:  Procedure Laterality Date  . CHOLECYSTECTOMY    . COLONOSCOPY N/A 11/30/2014   Procedure: COLONOSCOPY;  Surgeon: Corbin Ade, MD;  [facility];  Service: Endoscopy;  Laterality: N/A;  7:30 Am  . ESOPHAGEAL DILATION    . HERNIA REPAIR     1981  . RETINAL DETACHMENT SURGERY Right   . TOTAL KNEE ARTHROPLASTY Left 12/11/2019   Procedure: TOTAL KNEE ARTHROPLASTY;  Surgeon: Ollen Gross, MD;  Location: [facility];  Service: Orthopedics;  Laterality: Left;   . VASCULAR SURGERY      There were no vitals filed for this visit.   Subjective Assessment - 12/18/19  0957    Subjective  Patient is a 59 y.o male who presents to physical therapy s/p L TKA 12/11/19. Patient reports he has not been able to sleep in his bed since surgery due to pain and wanting to keep it straight. His pain is a 5/10 currently. He has been doing exercises at least 1x/day that were given to him in hospital. Pain meds have been helping. He has been doing leg lifts, heel slides, and ankle pumps. He has been using ice a lot. His main goal is to get back to work.    Limitations  Walking;House hold activities;Standing    How long can you stand comfortably?  unable    How long can you walk comfortably?  unable    Patient Stated Goals  To get back to work    Currently in Pain?  Yes    Pain Score  5     Pain Location  Knee    Pain Orientation  Left    Pain Descriptors / Indicators  Aching    Pain Type  Surgical pain         [facility] PT Assessment - 12/18/19 0001      Assessment  Medical Diagnosis  L TKA    Referring Provider (PT)  Ollen Gross MD    Onset Date/Surgical Date  12/11/19    Next MD Visit  12/31/19    Prior Therapy  None      Precautions   Precautions  Knee      Restrictions   Weight Bearing Restrictions  No      Balance Screen   Has the patient fallen in the past 6 months  No    Has the patient had a decrease in activity level because of a fear of falling?   No    Is the patient reluctant to leave their home because of a fear of falling?   No      Prior Function   Level of Independence  Independent    Vocation  Full time employment    Vocation Requirements  Build Cabinets      Cognition   Overall Cognitive Status  Within Functional Limits for tasks assessed      Observation/Other Assessments   Observations  Patient ambulates with RW, limited knee flexion/extension ROM, seated in mid range flexion, requies UE use for tranfers and relies on RLE, unable to observe incision due to bandage being in tact    Focus on Therapeutic Outcomes (FOTO)   85% limited       Sensation   Light Touch  Appears Intact      ROM / Strength   AROM / PROM / Strength  AROM;Strength      AROM   Overall AROM   Deficits;Due to pain    Overall AROM Comments  LLE limited, unable to tolerate over pressure due to pain    AROM Assessment Site  Knee    Right/Left Knee  Right;Left    Right Knee Extension  0    Right Knee Flexion  140    Left Knee Extension  12   lacking   Left Knee Flexion  97      Strength   Overall Strength  Deficits;Due to pain    Strength Assessment Site  Hip;Knee;Ankle    Right/Left Hip  Right;Left    Right Hip Flexion  4-/5    Left Hip Flexion  3/5    Right/Left Knee  Right;Left    Right Knee Flexion  4-/5    Right Knee Extension  5/5    Left Knee Flexion  3-/5    Left Knee Extension  3-/5    Right/Left Ankle  Right;Left    Right Ankle Dorsiflexion  4+/5    Left Ankle Dorsiflexion  5/5      Palpation   Palpation comment  No extreme tenderness to L quads, hamstings, gastroc; bruising throughout LE       Bed Mobility   Bed Mobility  Sit to Supine    Sit to Supine  Minimal Assistance - Patient > 75%   assist for LE due to pain and weakness     Transfers   Five time sit to stand comments   23.69 Seconds    Comments  requires UE use, relies on RLE      Ambulation/Gait   Ambulation/Gait  Yes    Ambulation/Gait Assistance  6: Modified independent (Device/Increase time)    Ambulation Distance (Feet)  100 Feet    Assistive device  Rolling walker    Gait Pattern  Antalgic;Left flexed knee in stance;Decreased hip/knee flexion - left;Decreased stance time - left    Ambulation Surface  Level;Indoor  Gait velocity  decreased                Objective measurements completed on examination: See above findings.              PT Education - 12/18/19 [phone]    Education Details  Patient educated on exam findings, POC, scope of PT, and initial HEP, not leaving knee in mid range positions and having knee in end range  extension/flexion, gradually increasing activity at home, using RW for safety, taking pain medication as prescribed    Person(s) Educated  Patient    Methods  Explanation    Comprehension  Verbalized understanding       PT Short Term Goals - 12/18/19 1112      PT SHORT TERM GOAL #1   Title  Patient will be independent with HEP in order to improve functional outcomes.    Time  3    Period  Weeks    Status  New    Target Date  01/08/20      PT SHORT TERM GOAL #2   Title  Patient will report at least 25% improvement in symptoms for improved quality of life.    Time  3    Period  Weeks    Status  New    Target Date  01/08/20        PT Long Term Goals - 12/18/19 1113      PT LONG TERM GOAL #1   Title  Patient will report at least 75% improvement in symptoms for improved quality of life.    Time  6    Period  Weeks    Status  New    Target Date  01/29/20      PT LONG TERM GOAL #2   Title  Patient will improve FOTO score by at least 10 points in order to indicate improved tolerance to activity.    Time  6    Period  Weeks    Status  New    Target Date  01/29/20      PT LONG TERM GOAL #3   Title  Patient will be able to ambulate for at least 60 minutes with pain no greater than 1/10 in order to demonstrate improved ability to ambulate for exercise.    Time  6    Period  Weeks    Status  New    Target Date  01/29/20      PT LONG TERM GOAL #4   Title  Patient will be able to complete 5x STS in under 11.4 seconds in order to reduce the risk of falls.    Time  6    Period  Weeks    Status  New    Target Date  01/29/20             Plan - 12/18/19 1106    Clinical Impression Statement  Patient is a 59 y.o male who presents to physical therapy s/p L TKA 12/11/19. He presents with pain limited deficits in L knee strength, ROM, endurance, gait, balance, activity tolerance and functional mobility with ADL. He is having to modify and restrict ADL as indicated by FOTO  score as well as subjective information and objective measures which is affecting overall participation. Patient will benefit from skilled physical therapy in order to improve function and reduce impairment.    Personal Factors and Comorbidities  Behavior Pattern;Comorbidity 2;Fitness;Past/Current Experience;Profession    Comorbidities  diabetes, high blood pressure  Examination-Activity Limitations  Bathing;Bed Mobility;Bend;Carry;Caring for Others;Dressing;Hygiene/Grooming;Lift;Locomotion Level;Sit;Sleep;Squat;Stairs;Stand;Toileting;Transfers    Examination-Participation Restrictions  Church;Cleaning;Community Activity;Driving;Laundry;Volunteer;Yard Work    Stability/Clinical Decision Making  Stable/Uncomplicated    Optometrist  Low    Rehab Potential  Good    PT Frequency  3x / week    PT Duration  6 weeks    PT Treatment/Interventions  ADLs/Self Care Home Management;Aquatic Therapy;Electrical Stimulation;Cryotherapy;Biofeedback;Iontophoresis 4mg /ml Dexamethasone;Moist Heat;Traction;Ultrasound;DME Instruction;Contrast Bath;Gait training;Stair training;Functional mobility training;Therapeutic activities;Therapeutic exercise;Balance training;Neuromuscular re-education;Patient/family education;Manual techniques;Manual lymph drainage;Compression bandaging;Scar mobilization;Passive range of motion;Dry needling;Energy conservation;Splinting;Taping;Vasopneumatic Device;Spinal Manipulations;Joint Manipulations    PT Next Visit Plan  initiate HEP, begin knee flexion/extension ROM exercises, begin LLE strengthening exercises as able    PT Home Exercise Plan  initiate next session    Consulted and Agree with Plan of Care  Patient       Patient will benefit from skilled therapeutic intervention in order to improve the following deficits and impairments:  Abnormal gait, Decreased activity tolerance, Decreased balance, Decreased mobility, Decreased knowledge of use of DME, Decreased endurance,  Decreased range of motion, Decreased strength, Difficulty walking, Hypomobility, Increased edema, Increased fascial restricitons, Increased muscle spasms, Impaired flexibility, Improper body mechanics, Pain  Visit Diagnosis: Left knee pain, unspecified chronicity  Other abnormalities of gait and mobility  Muscle weakness (generalized)  Other symptoms and signs involving the musculoskeletal system     Problem List Patient Active Problem List   Diagnosis Date Noted  . OA (osteoarthritis) of knee 12/11/2019  . Osteoarthritis of left knee 12/11/2019  . Lab test positive for detection of COVID-19 virus 08/19/2019  . Hyperlipidemia LDL goal <100 06/20/2018  . Erectile dysfunction 12/16/2016  . Essential hypertension 06/19/2016  . Neuropathy due to type 2 diabetes mellitus (HCC) 06/19/2016  . Diabetes (HCC) 03/20/2015  . Diverticulosis of colon without hemorrhage   . Elevated blood pressure 08/01/2014  . Prostate hypertrophy 06/25/2014  . Esophageal reflux 06/25/2014    12:14 PM, 12/18/19 12/20/19 PT, DPT Physical Therapist at [facility]   [facility] [address] [place], [place], [place] Phone: [phone]   Fax:  [phone]  Name: PERKINS MOLINA MRN: Rozanna Box Date of Birth: Jun 23, 1961

## 2019-12-20 ENCOUNTER — Ambulatory Visit (HOSPITAL_COMMUNITY): Payer: BC Managed Care – PPO | Admitting: Physical Therapy

## 2019-12-20 ENCOUNTER — Encounter (HOSPITAL_COMMUNITY): Payer: Self-pay | Admitting: Physical Therapy

## 2019-12-20 ENCOUNTER — Other Ambulatory Visit: Payer: Self-pay

## 2019-12-20 DIAGNOSIS — M25562 Pain in left knee: Secondary | ICD-10-CM | POA: Diagnosis not present

## 2019-12-20 DIAGNOSIS — M6281 Muscle weakness (generalized): Secondary | ICD-10-CM

## 2019-12-20 DIAGNOSIS — R29898 Other symptoms and signs involving the musculoskeletal system: Secondary | ICD-10-CM | POA: Diagnosis not present

## 2019-12-20 DIAGNOSIS — R2689 Other abnormalities of gait and mobility: Secondary | ICD-10-CM

## 2019-12-20 NOTE — Therapy (Signed)
[facility] [address] [place], [place], [address] Phone: [phone]   Fax:  [phone]  Physical Therapy Treatment  Patient Details  Name: [name] [name] MRN: [id number] Date of Birth: 1961-07-25 Referring Provider (PT): Ollen Gross MD   Encounter Date: 12/20/2019  PT End of Session - 12/20/19 1024    Visit Number  2    Number of Visits  18    Date for PT Re-Evaluation  01/29/20    Authorization Type  BSBS (30 visit limit no auth)    Progress Note Due on Visit  10    PT Start Time  [phone]    PT Stop Time  1027    PT Time Calculation (min)  40 min    Activity Tolerance  Patient tolerated treatment well;Patient limited by pain    Behavior During Therapy  [facility] for tasks assessed/performed       Past Medical History:  Diagnosis Date  . Anxiety   . Arthritis   . Diabetes mellitus without complication (HCC)   . Diverticulosis    pt unaware  . ED (erectile dysfunction)   . Fatty liver   . GERD (gastroesophageal reflux disease)   . History of COVID-19 07/2019  . History of left inguinal hernia   . History of retinal detachment    Right  . Hyperlipidemia   . Hypertension   . Insomnia   . Low ferritin level   . Numbness and tingling of both feet   . Shortness of breath dyspnea    since having COVID 07/2019 with exertions    Past Surgical History:  Procedure Laterality Date  . CHOLECYSTECTOMY    . COLONOSCOPY N/A 11/30/2014   Procedure: COLONOSCOPY;  Surgeon: Corbin Ade, MD;  [facility];  Service: Endoscopy;  Laterality: N/A;  7:30 Am  . ESOPHAGEAL DILATION    . HERNIA REPAIR     1981  . RETINAL DETACHMENT SURGERY Right   . TOTAL KNEE ARTHROPLASTY Left 12/11/2019   Procedure: TOTAL KNEE ARTHROPLASTY;  Surgeon: Ollen Gross, MD;  Location[facility];  Service: Orthopedics;  Laterality: Left;   . VASCULAR SURGERY      There were no vitals filed for this visit.  Subjective Assessment - 12/20/19  0948    Subjective  Patient wondering about how to straighten knee more while sitting in recliner. He has not been able to get more pain pills yet. Walking has been getting better. Prior exercises are going well.    Limitations  Walking;House hold activities;Standing    How long can you stand comfortably?  unable    How long can you walk comfortably?  unable    Patient Stated Goals  To get back to work    Currently in Pain?  Yes    Pain Score  5     Pain Location  Knee    Pain Orientation  Left                       [facility] Adult PT Treatment/Exercise - 12/20/19 0001      Exercises   Exercises  Knee/Hip      Knee/Hip Exercises: Stretches   Gastroc Stretch  3 reps;30 seconds    Gastroc Stretch Limitations  slant board      Knee/Hip Exercises: Seated   Long Arc Quad  Left;10 reps    Long Arc Quad Limitations  5-10 second holds    Heel  Slides  10 reps    Heel Slides Limitations  10 second holds      Knee/Hip Exercises: Supine   Quad Sets  10 reps    Quad Sets Limitations  10 second holds    Heel Slides  10 reps;Left    Heel Slides Limitations  10 sec hold    Knee Extension  AROM    Knee Extension Limitations  lacking 12    Knee Flexion  AROM    Knee Flexion Limitations  88             PT Education - 12/20/19 1023    Education Details  Patient educated on positioning of knee at rest, mechanics of exercise, importance of HEP, frequency of HEP    Person(s) Educated  Patient    Methods  Explanation;Handout    Comprehension  Verbalized understanding       PT Short Term Goals - 12/20/19 1028      PT SHORT TERM GOAL #1   Title  Patient will be independent with HEP in order to improve functional outcomes.    Time  3    Period  Weeks    Status  On-going    Target Date  01/08/20      PT SHORT TERM GOAL #2   Title  Patient will report at least 25% improvement in symptoms for improved quality of life.    Time  3    Period  Weeks    Status  On-going     Target Date  01/08/20        PT Long Term Goals - 12/20/19 1028      PT LONG TERM GOAL #1   Title  Patient will report at least 75% improvement in symptoms for improved quality of life.    Time  6    Period  Weeks    Status  On-going      PT LONG TERM GOAL #2   Title  Patient will improve FOTO score by at least 10 points in order to indicate improved tolerance to activity.    Time  6    Period  Weeks    Status  On-going      PT LONG TERM GOAL #3   Title  Patient will be able to ambulate for at least 60 minutes with pain no greater than 1/10 in order to demonstrate improved ability to ambulate for exercise.    Time  6    Period  Weeks    Status  On-going      PT LONG TERM GOAL #4   Title  Patient will be able to complete 5x STS in under 11.4 seconds in order to reduce the risk of falls.    Time  6    Period  Weeks    Status  On-going            Plan - 12/20/19 1027    Clinical Impression Statement  Patient ROM same as initial evaluation today. He is ambulating with improving gait pattern and easier with RW compared to evaluation and appears to have improved knee extension with gait despite ROM being same upon measurement. He experiences pain with heel sides but demonstrates improved knee ROM to 95 degrees of flexion following. He demonstrates He shows good quad activation with quad sets and SAQ and LAQ but lacks terminal knee extension. He fatigues quickly with quad strengthening exercises today. Patient will continue to benefit from skilled physical therapy in order  to reduce impairment and improve function.    Personal Factors and Comorbidities  Behavior Pattern;Comorbidity 2;Fitness;Past/Current Experience;Profession    Comorbidities  diabetes, high blood pressure    Examination-Activity Limitations  Bathing;Bed Mobility;Bend;Carry;Caring for Others;Dressing;Hygiene/Grooming;Lift;Locomotion Level;Sit;Sleep;Squat;Stairs;Stand;Toileting;Transfers     Examination-Participation Restrictions  Church;Cleaning;Community Activity;Driving;Laundry;Volunteer;Yard Work    Stability/Clinical Decision Making  Stable/Uncomplicated    Rehab Potential  Good    PT Frequency  3x / week    PT Duration  6 weeks    PT Treatment/Interventions  ADLs/Self Care Home Management;Aquatic Therapy;Electrical Stimulation;Cryotherapy;Biofeedback;Iontophoresis 4mg /ml Dexamethasone;Moist Heat;Traction;Ultrasound;DME Instruction;Contrast Bath;Gait training;Stair training;Functional mobility training;Therapeutic activities;Therapeutic exercise;Balance training;Neuromuscular re-education;Patient/family education;Manual techniques;Manual lymph drainage;Compression bandaging;Scar mobilization;Passive range of motion;Dry needling;Energy conservation;Splinting;Taping;Vasopneumatic Device;Spinal Manipulations;Joint Manipulations    PT Next Visit Plan  continue knee flexion/extension ROM exercises, continue LLE strengthening exercises as able    PT Home Exercise Plan  12/20/19 quad sets, SAQ, LAQ, heel slides in seated and supine    Consulted and Agree with Plan of Care  Patient       Patient will benefit from skilled therapeutic intervention in order to improve the following deficits and impairments:  Abnormal gait, Decreased activity tolerance, Decreased balance, Decreased mobility, Decreased knowledge of use of DME, Decreased endurance, Decreased range of motion, Decreased strength, Difficulty walking, Hypomobility, Increased edema, Increased fascial restricitons, Increased muscle spasms, Impaired flexibility, Improper body mechanics, Pain  Visit Diagnosis: Left knee pain, unspecified chronicity  Other abnormalities of gait and mobility  Muscle weakness (generalized)  Other symptoms and signs involving the musculoskeletal system     Problem List Patient Active Problem List   Diagnosis Date Noted  . OA (osteoarthritis) of knee 12/11/2019  . Osteoarthritis of left knee  12/11/2019  . Lab test positive for detection of COVID-19 virus 08/19/2019  . Hyperlipidemia LDL goal <100 06/20/2018  . Erectile dysfunction 12/16/2016  . Essential hypertension 06/19/2016  . Neuropathy due to type 2 diabetes mellitus (HCC) 06/19/2016  . Diabetes (HCC) 03/20/2015  . Diverticulosis of colon without hemorrhage   . Elevated blood pressure 08/01/2014  . Prostate hypertrophy 06/25/2014  . Esophageal reflux 06/25/2014   10:30 AM, 12/20/19 12/22/19 PT, DPT Physical Therapist at [facility]   [facility] [address] [place], [place], [place] Phone: [phone]   Fax:  [phone]  Name: [name] [name] MRN: [id number] Date of Birth: 03/19/1961

## 2019-12-22 ENCOUNTER — Other Ambulatory Visit: Payer: Self-pay

## 2019-12-22 ENCOUNTER — Encounter (HOSPITAL_COMMUNITY): Payer: Self-pay

## 2019-12-22 ENCOUNTER — Other Ambulatory Visit: Payer: Self-pay | Admitting: Family Medicine

## 2019-12-22 ENCOUNTER — Ambulatory Visit (HOSPITAL_COMMUNITY): Payer: BC Managed Care – PPO | Attending: Orthopedic Surgery

## 2019-12-22 DIAGNOSIS — M6281 Muscle weakness (generalized): Secondary | ICD-10-CM | POA: Insufficient documentation

## 2019-12-22 DIAGNOSIS — R2689 Other abnormalities of gait and mobility: Secondary | ICD-10-CM | POA: Insufficient documentation

## 2019-12-22 DIAGNOSIS — R29898 Other symptoms and signs involving the musculoskeletal system: Secondary | ICD-10-CM | POA: Diagnosis not present

## 2019-12-22 DIAGNOSIS — M25562 Pain in left knee: Secondary | ICD-10-CM | POA: Insufficient documentation

## 2019-12-22 NOTE — Therapy (Signed)
[facility] [address] [place], [place], [address] Phone: [phone]   Fax:  [phone]  Physical Therapy Treatment  Patient Details  Name: [name] [name] MRN: [id number] Date of Birth: 1961/03/19 Referring Provider (PT): Ollen Gross MD   Encounter Date: 12/22/2019  PT End of Session - 12/22/19 1637    Visit Number  3    Number of Visits  18    Date for PT Re-Evaluation  01/29/20    Authorization Type  BSBS (30 visit limit no auth)    Authorization Time Period  3/29-->01/29/20    PT Start Time  1620    PT Stop Time  1658    PT Time Calculation (min)  38 min    Equipment Utilized During Treatment  Gait belt    Activity Tolerance  Patient tolerated treatment well;Patient limited by pain;No increased pain    Behavior During Therapy  WFL for tasks assessed/performed       Past Medical History:  Diagnosis Date  . Anxiety   . Arthritis   . Diabetes mellitus without complication (HCC)   . Diverticulosis    pt unaware  . ED (erectile dysfunction)   . Fatty liver   . GERD (gastroesophageal reflux disease)   . History of COVID-19 07/2019  . History of left inguinal hernia   . History of retinal detachment    Right  . Hyperlipidemia   . Hypertension   . Insomnia   . Low ferritin level   . Numbness and tingling of both feet   . Shortness of breath dyspnea    since having COVID 07/2019 with exertions    Past Surgical History:  Procedure Laterality Date  . CHOLECYSTECTOMY    . COLONOSCOPY N/A 11/30/2014   Procedure: COLONOSCOPY;  Surgeon: Corbin Ade, MD;  [facility];  Service: Endoscopy;  Laterality: N/A;  7:30 Am  . ESOPHAGEAL DILATION    . HERNIA REPAIR     1981  . RETINAL DETACHMENT SURGERY Right   . TOTAL KNEE ARTHROPLASTY Left 12/11/2019   Procedure: TOTAL KNEE ARTHROPLASTY;  Surgeon: Ollen Gross, MD;  Location: [facility];  Service: Orthopedics;  Laterality: Left;   . VASCULAR SURGERY       There were no vitals filed for this visit.  Subjective Assessment - 12/22/19 1623    Subjective  Pt arrived with RW and walked into restroom without AD, stated he only uses the walker when out of house.  Pt stated he has been applying ice regularly and sleeping in recliner with ice machine all night long.  Wondering how to straigthen knee more while sitting in recliner.    Patient Stated Goals  To get back to work    Currently in Pain?  Yes    Pain Score  4     Pain Location  Knee    Pain Orientation  Left    Pain Descriptors / Indicators  Aching;Sore;Tightness    Pain Type  Surgical pain         [facility] PT Assessment - 12/22/19 0001      Assessment   Medical Diagnosis  L TKA    Referring Provider (PT)  Ollen Gross MD    Onset Date/Surgical Date  12/11/19    Next MD Visit  12/31/19    Prior Therapy  None      Precautions   Precautions  Knee  [facility] Adult PT Treatment/Exercise - 12/22/19 0001      Ambulation/Gait   Ambulation/Gait  Yes    Ambulation/Gait Assistance  6: Modified independent (Device/Increase time)    Ambulation Distance (Feet)  200 Feet    Assistive device  Straight cane    Gait Pattern  Antalgic;Left flexed knee in stance;Decreased hip/knee flexion - left;Decreased stance time - left    Ambulation Surface  Level;Indoor    Gait velocity  decreased    Gait Comments  Pt able to demonstrate appropriate sequence, cueing for heel strike and equal stride length      Exercises   Exercises  Knee/Hip      Knee/Hip Exercises: Stretches   Knee: Self-Stretch to increase Flexion  5 reps;10 seconds    Knee: Self-Stretch Limitations  knee drive on 87OM step 5x 10" holds    Gastroc Stretch  3 reps;30 seconds    Gastroc Stretch Limitations  slant board      Knee/Hip Exercises: Standing   Heel Raises  10 reps    Heel Raises Limitations  toe raise    Gait Training  29ft with SPC      Knee/Hip Exercises: Seated   Other Seated Knee/Hip  Exercises  leg propped on chair x 2 min to assist with knee extension in seated position      Knee/Hip Exercises: Supine   Quad Sets  10 reps    Quad Sets Limitations  5-10" holds    Short Arc [employer]  10 reps    Short Arc Quad Sets Limitations  5" holds    Heel Slides  10 reps;Left    Heel Slides Limitations  10 sec hold    Knee Extension  AROM    Knee Extension Limitations  lacking 12   was lacking 12   Knee Flexion  AROM    Knee Flexion Limitations  88   was 88              PT Short Term Goals - 12/20/19 1028      PT SHORT TERM GOAL #1   Title  Patient will be independent with HEP in order to improve functional outcomes.    Time  3    Period  Weeks    Status  On-going    Target Date  01/08/20      PT SHORT TERM GOAL #2   Title  Patient will report at least 25% improvement in symptoms for improved quality of life.    Time  3    Period  Weeks    Status  On-going    Target Date  01/08/20        PT Long Term Goals - 12/20/19 1028      PT LONG TERM GOAL #1   Title  Patient will report at least 75% improvement in symptoms for improved quality of life.    Time  6    Period  Weeks    Status  On-going      PT LONG TERM GOAL #2   Title  Patient will improve FOTO score by at least 10 points in order to indicate improved tolerance to activity.    Time  6    Period  Weeks    Status  On-going      PT LONG TERM GOAL #3   Title  Patient will be able to ambulate for at least 60 minutes with pain no greater than 1/10 in order to demonstrate improved ability to  ambulate for exercise.    Time  6    Period  Weeks    Status  On-going      PT LONG TERM GOAL #4   Title  Patient will be able to complete 5x STS in under 11.4 seconds in order to reduce the risk of falls.    Time  6    Period  Weeks    Status  On-going            Plan - 12/22/19 1653    Clinical Impression Statement  Entrance to dept to ambulated without AD into restroom, reports he does this  around the house- noted antalgic gait mechanics.  Pt educated on benefits of use of cane to improve stabilty and balalnce during gait.  Min cueing initially for proper hand use and sequence as well as heel to toe mechanics then able to demonstrate good mechanics.  Pt session focus on knee mobility with additional standing stretches and gait assocated exercises.  Pt improved mobility with AROM at 10-96 degrees.  Discussed returning to bed vs recliner for comfort and ice application, also discussed propping LE onto stool or placing pillow under heel while sitting in recliner to assist with extension.  No reports of increased pain through session.    Personal Factors and Comorbidities  Behavior Pattern;Comorbidity 2;Fitness;Past/Current Experience;Profession    Comorbidities  diabetes, high blood pressure    Examination-Activity Limitations  Bathing;Bed Mobility;Bend;Carry;Caring for Others;Dressing;Hygiene/Grooming;Lift;Locomotion Level;Sit;Sleep;Squat;Stairs;Stand;Toileting;Transfers    Examination-Participation Restrictions  Church;Cleaning;Community Activity;Driving;Laundry;Volunteer;Yard Work    Optometrist  Low    Rehab Potential  Good    PT Frequency  3x / week    PT Duration  6 weeks    PT Treatment/Interventions  ADLs/Self Care Home Management;Aquatic Therapy;Electrical Stimulation;Cryotherapy;Biofeedback;Iontophoresis 4mg /ml Dexamethasone;Moist Heat;Traction;Ultrasound;DME Instruction;Contrast Bath;Gait training;Stair training;Functional mobility training;Therapeutic activities;Therapeutic exercise;Balance training;Neuromuscular re-education;Patient/family education;Manual techniques;Manual lymph drainage;Compression bandaging;Scar mobilization;Passive range of motion;Dry needling;Energy conservation;Splinting;Taping;Vasopneumatic Device;Spinal Manipulations;Joint Manipulations    PT Next Visit Plan  MD progress note prior apt on 12/26/19.  continue knee flexion/extension ROM exercises,  continue LLE strengthening exercises as able    PT Home Exercise Plan  12/20/19 quad sets, SAQ, LAQ, heel slides in seated and supine       Patient will benefit from skilled therapeutic intervention in order to improve the following deficits and impairments:  Abnormal gait, Decreased activity tolerance, Decreased balance, Decreased mobility, Decreased knowledge of use of DME, Decreased endurance, Decreased range of motion, Decreased strength, Difficulty walking, Hypomobility, Increased edema, Increased fascial restricitons, Increased muscle spasms, Impaired flexibility, Improper body mechanics, Pain  Visit Diagnosis: Left knee pain, unspecified chronicity  Other abnormalities of gait and mobility  Muscle weakness (generalized)  Other symptoms and signs involving the musculoskeletal system     Problem List Patient Active Problem List   Diagnosis Date Noted  . OA (osteoarthritis) of knee 12/11/2019  . Osteoarthritis of left knee 12/11/2019  . Lab test positive for detection of COVID-19 virus 08/19/2019  . Hyperlipidemia LDL goal <100 06/20/2018  . Erectile dysfunction 12/16/2016  . Essential hypertension 06/19/2016  . Neuropathy due to type 2 diabetes mellitus (HCC) 06/19/2016  . Diabetes (HCC) 03/20/2015  . Diverticulosis of colon without hemorrhage   . Elevated blood pressure 08/01/2014  . Prostate hypertrophy 06/25/2014  . Esophageal reflux 06/25/2014   08/25/2014, LPTA/CLT; CBIS [phone]  [phone] 12/22/2019, 6:05 PM  [facility] [address] [place], [place], [place] Phone: [phone]   Fax:  [phone]  Name: ERYX ZANE MRN: [id number] Date of Birth: [birth year]

## 2019-12-25 ENCOUNTER — Encounter (HOSPITAL_COMMUNITY): Payer: Self-pay | Admitting: Physical Therapy

## 2019-12-25 ENCOUNTER — Other Ambulatory Visit: Payer: Self-pay

## 2019-12-25 ENCOUNTER — Ambulatory Visit (HOSPITAL_COMMUNITY): Payer: BC Managed Care – PPO | Admitting: Physical Therapy

## 2019-12-25 DIAGNOSIS — R29898 Other symptoms and signs involving the musculoskeletal system: Secondary | ICD-10-CM

## 2019-12-25 DIAGNOSIS — R2689 Other abnormalities of gait and mobility: Secondary | ICD-10-CM

## 2019-12-25 DIAGNOSIS — M25562 Pain in left knee: Secondary | ICD-10-CM

## 2019-12-25 DIAGNOSIS — M6281 Muscle weakness (generalized): Secondary | ICD-10-CM

## 2019-12-25 NOTE — Therapy (Signed)
[place], [place], [address] Phone: [phone]   Fax:  [phone]  Physical Therapy Treatment  Patient Details  Name: [name] [name] MRN: [id number] Date of Birth: 11/17/1960 Referring Provider (PT): Gaynelle Arabian MD   Encounter Date: 12/25/2019  PT End of Session - 12/25/19 1022    Visit Number  4    Number of Visits  18    Date for PT Re-Evaluation  01/29/20    Authorization Type  BSBS (30 visit limit no auth)    Authorization Time Period  3/29-->01/29/20    PT Start Time  0949    PT Stop Time  1028    PT Time Calculation (min)  39 min    Equipment Utilized During Treatment  Gait belt    Activity Tolerance  Patient tolerated treatment well;Patient limited by pain;No increased pain    Behavior During Therapy  WFL for tasks assessed/performed       Past Medical History:  Diagnosis Date  . Anxiety   . Arthritis   . Diabetes mellitus without complication ([place])   . Diverticulosis    pt unaware  . ED (erectile dysfunction)   . Fatty liver   . GERD (gastroesophageal reflux disease)   . History of COVID-19 07/2019  . History of left inguinal hernia   . History of retinal detachment    Right  . Hyperlipidemia   . Hypertension   . Insomnia   . Low ferritin level   . Numbness and tingling of both feet   . Shortness of breath dyspnea    since having COVID 07/2019 with exertions    Past Surgical History:  Procedure Laterality Date  . CHOLECYSTECTOMY    . COLONOSCOPY N/A 11/30/2014   Procedure: COLONOSCOPY;  Surgeon: Daneil Dolin, MD;  [facility];  Service: Endoscopy;  Laterality: N/A;  7:30 Am  . ESOPHAGEAL DILATION    . HERNIA REPAIR     1981  . RETINAL DETACHMENT SURGERY Right   . TOTAL KNEE ARTHROPLASTY Left 12/11/2019   Procedure: TOTAL KNEE ARTHROPLASTY;  Surgeon: Gaynelle Arabian, MD;  [facility];  Service: Orthopedics;  Laterality: Left;  26min  . VASCULAR SURGERY       There were no vitals filed for this visit.  Subjective Assessment - 12/25/19 0950    Subjective  Patient reports he is not using his walker at home. He is having more pain in his knee especially after exercise. He is trying to keep it straight as much as he can.    Patient Stated Goals  To get back to work    Currently in Pain?  Yes    Pain Score  5     Pain Location  Knee                       [facility] Adult PT Treatment/Exercise - 12/25/19 0001      Ambulation/Gait   Ambulation/Gait  Yes    Ambulation/Gait Assistance  6: Modified independent (Device/Increase time)    Ambulation Distance (Feet)  200 Feet    Assistive device  Straight cane    Gait Pattern  Antalgic;Left flexed knee in stance;Decreased hip/knee flexion - left;Decreased stance time - left    Ambulation Surface  Level;Indoor    Gait velocity  decreased    Gait Comments  with verbal cueing for heel toe gait       Knee/Hip Exercises: Stretches  Gastroc Stretch  3 reps;30 seconds    Gastroc Stretch Limitations  slant board      Knee/Hip Exercises: Aerobic   Recumbent Bike  5 minutes rocking for flexion/extension ROM      Knee/Hip Exercises: Standing   Other Standing Knee Exercises  TKE with band and cueing for quad activation 10 x 5 second hold      Knee/Hip Exercises: Supine   Bridges  AROM;2 sets;10 reps    Knee Extension  AROM    Knee Extension Limitations  lacking 12    Knee Flexion  AROM    Knee Flexion Limitations  98      Knee/Hip Exercises: Sidelying   Clams  2x10             PT Education - 12/25/19 1022    Education Details  Patient educated on positioning at rest, completing HEP, and mechanics of exercise    Person(s) Educated  Patient    Methods  Explanation    Comprehension  Verbalized understanding       PT Short Term Goals - 12/20/19 1028      PT SHORT TERM GOAL #1   Title  Patient will be independent with HEP in order to improve functional outcomes.    Time  3     Period  Weeks    Status  On-going    Target Date  01/08/20      PT SHORT TERM GOAL #2   Title  Patient will report at least 25% improvement in symptoms for improved quality of life.    Time  3    Period  Weeks    Status  On-going    Target Date  01/08/20        PT Long Term Goals - 12/20/19 1028      PT LONG TERM GOAL #1   Title  Patient will report at least 75% improvement in symptoms for improved quality of life.    Time  6    Period  Weeks    Status  On-going      PT LONG TERM GOAL #2   Title  Patient will improve FOTO score by at least 10 points in order to indicate improved tolerance to activity.    Time  6    Period  Weeks    Status  On-going      PT LONG TERM GOAL #3   Title  Patient will be able to ambulate for at least 60 minutes with pain no greater than 1/10 in order to demonstrate improved ability to ambulate for exercise.    Time  6    Period  Weeks    Status  On-going      PT LONG TERM GOAL #4   Title  Patient will be able to complete 5x STS in under 11.4 seconds in order to reduce the risk of falls.    Time  6    Period  Weeks    Status  On-going            Plan - 12/25/19 1022    Clinical Impression Statement  Patient continues to demonstrate impaired knee flexion/extension with limited improvement over last several sessions. He ambulates with decreased knee flexion extension ROM with antalgic gait and SPC with good carryover for cueing for heel toe gait. He continues to favor LLE with transfers. He has difficulty achieving end range extension with TKE exercise and requires cueing for quad activation and hold. He is  educated on sitting with nothing under her knee to limit knee flexion and promote extension ROM. He has difficulty with bridge exercise secondary to impaired knee flexion ROM. Patient is limited by pain and c/o stiffness throughout session and improves knee flexion minimally at end of session to 99. Patient will continue to benefit from  skilled physical therapy in order to reduce impairment and improve function.    Personal Factors and Comorbidities  Behavior Pattern;Comorbidity 2;Fitness;Past/Current Experience;Profession    Comorbidities  diabetes, high blood pressure    Examination-Activity Limitations  Bathing;Bed Mobility;Bend;Carry;Caring for Others;Dressing;Hygiene/Grooming;Lift;Locomotion Level;Sit;Sleep;Squat;Stairs;Stand;Toileting;Transfers    Examination-Participation Restrictions  Church;Cleaning;Community Activity;Driving;Laundry;Volunteer;Yard Work    Rehab Potential  Good    PT Frequency  3x / week    PT Duration  6 weeks    PT Treatment/Interventions  ADLs/Self Care Home Management;Aquatic Therapy;Electrical Stimulation;Cryotherapy;Biofeedback;Iontophoresis 4mg /ml Dexamethasone;Moist Heat;Traction;Ultrasound;DME Instruction;Contrast Bath;Gait training;Stair training;Functional mobility training;Therapeutic activities;Therapeutic exercise;Balance training;Neuromuscular re-education;Patient/family education;Manual techniques;Manual lymph drainage;Compression bandaging;Scar mobilization;Passive range of motion;Dry needling;Energy conservation;Splinting;Taping;Vasopneumatic Device;Spinal Manipulations;Joint Manipulations    PT Next Visit Plan  continue knee flexion/extension ROM exercises, continue LLE strengthening exercises as able    PT Home Exercise Plan  12/20/19 quad sets, SAQ, LAQ, heel slides in seated and supine       Patient will benefit from skilled therapeutic intervention in order to improve the following deficits and impairments:  Abnormal gait, Decreased activity tolerance, Decreased balance, Decreased mobility, Decreased knowledge of use of DME, Decreased endurance, Decreased range of motion, Decreased strength, Difficulty walking, Hypomobility, Increased edema, Increased fascial restricitons, Increased muscle spasms, Impaired flexibility, Improper body mechanics, Pain  Visit Diagnosis: Left knee pain,  unspecified chronicity  Other abnormalities of gait and mobility  Muscle weakness (generalized)  Other symptoms and signs involving the musculoskeletal system     Problem List Patient Active Problem List   Diagnosis Date Noted  . OA (osteoarthritis) of knee 12/11/2019  . Osteoarthritis of left knee 12/11/2019  . Lab test positive for detection of COVID-19 virus 08/19/2019  . Hyperlipidemia LDL goal <100 06/20/2018  . Erectile dysfunction 12/16/2016  . Essential hypertension 06/19/2016  . Neuropathy due to type 2 diabetes mellitus (HCC) 06/19/2016  . Diabetes (HCC) 03/20/2015  . Diverticulosis of colon without hemorrhage   . Elevated blood pressure 08/01/2014  . Prostate hypertrophy 06/25/2014  . Esophageal reflux 06/25/2014    10:32 AM, 12/25/19 02/24/20 PT, DPT Physical Therapist at [facility]   [facility] [address] [place], [place], [place] Phone: [phone]   Fax:  [phone]  Name: ROLLINS WRIGHTSON MRN: [id number] Date of Birth: October 04, 1960

## 2019-12-27 ENCOUNTER — Other Ambulatory Visit: Payer: Self-pay

## 2019-12-27 ENCOUNTER — Encounter (HOSPITAL_COMMUNITY): Payer: Self-pay | Admitting: Physical Therapy

## 2019-12-27 ENCOUNTER — Ambulatory Visit (HOSPITAL_COMMUNITY): Payer: BC Managed Care – PPO | Admitting: Physical Therapy

## 2019-12-27 DIAGNOSIS — R29898 Other symptoms and signs involving the musculoskeletal system: Secondary | ICD-10-CM | POA: Diagnosis not present

## 2019-12-27 DIAGNOSIS — R2689 Other abnormalities of gait and mobility: Secondary | ICD-10-CM

## 2019-12-27 DIAGNOSIS — M6281 Muscle weakness (generalized): Secondary | ICD-10-CM

## 2019-12-27 DIAGNOSIS — M25562 Pain in left knee: Secondary | ICD-10-CM | POA: Diagnosis not present

## 2019-12-27 NOTE — Therapy (Signed)
[facility] [address] [place], [place], [address] Phone: [phone]   Fax:  [phone]  Physical Therapy Treatment  Patient Details  Name: [name] [name] MRN: [id number] Date of Birth: April 30, 1961 Referring Provider (PT): Ollen Gross MD   Encounter Date: 12/27/2019  PT End of Session - 12/27/19 0952    Visit Number  5    Number of Visits  18    Date for PT Re-Evaluation  01/29/20    Authorization Type  BSBS (30 visit limit no auth)    Authorization Time Period  3/29-->01/29/20    Progress Note Due on Visit  10    PT Start Time  [phone]    PT Stop Time  1028    PT Time Calculation (min)  40 min    Equipment Utilized During Treatment  Gait belt    Activity Tolerance  Patient tolerated treatment well;Patient limited by pain;No increased pain    Behavior During Therapy  WFL for tasks assessed/performed       Past Medical History:  Diagnosis Date  . Anxiety   . Arthritis   . Diabetes mellitus without complication (HCC)   . Diverticulosis    pt unaware  . ED (erectile dysfunction)   . Fatty liver   . GERD (gastroesophageal reflux disease)   . History of COVID-19 07/2019  . History of left inguinal hernia   . History of retinal detachment    Right  . Hyperlipidemia   . Hypertension   . Insomnia   . Low ferritin level   . Numbness and tingling of both feet   . Shortness of breath dyspnea    since having COVID 07/2019 with exertions    Past Surgical History:  Procedure Laterality Date  . CHOLECYSTECTOMY    . COLONOSCOPY N/A 11/30/2014   Procedure: COLONOSCOPY;  Surgeon: Corbin Ade, MD;  [facility];  Service: Endoscopy;  Laterality: N/A;  7:30 Am  . ESOPHAGEAL DILATION    . HERNIA REPAIR     1981  . RETINAL DETACHMENT SURGERY Right   . TOTAL KNEE ARTHROPLASTY Left 12/11/2019   Procedure: TOTAL KNEE ARTHROPLASTY;  Surgeon: Ollen Gross, MD;  Location: [facility];  Service: Orthopedics;  Laterality: Left;    . VASCULAR SURGERY      There were no vitals filed for this visit.  Subjective Assessment - 12/27/19 0947    Subjective  Patient reports he has been really sore in his knee and had to take pain meds last night. He states his MD was pleased with progress and he is bending it well.    Patient Stated Goals  To get back to work    Currently in Pain?  Yes    Pain Score  6     Pain Location  Knee    Pain Orientation  Left                       [facility] Adult PT Treatment/Exercise - 12/27/19 0001      Ambulation/Gait   Ambulation/Gait  Yes    Ambulation/Gait Assistance  6: Modified independent (Device/Increase time)    Ambulation Distance (Feet)  200 Feet    Assistive device  Straight cane    Gait Pattern  Antalgic;Left flexed knee in stance;Decreased hip/knee flexion - left;Decreased stance time - left    Ambulation Surface  Level;Indoor    Gait velocity  decreased    Gait Comments  with verbal cueing for heel toe gait       Knee/Hip Exercises: Stretches   Gastroc Stretch  3 reps;30 seconds    Gastroc Stretch Limitations  at wall      Knee/Hip Exercises: Aerobic   Recumbent Bike  5 minutes rocking for flexion/extension ROM      Knee/Hip Exercises: Seated   Long Arc Quad  Left;10 reps    Long Arc Quad Limitations  5-10 second holds      Knee/Hip Exercises: Supine   Quad Sets  10 reps    Quad Sets Limitations  5-10 second holds    Knee Extension  AROM    Knee Extension Limitations  lacking 12    Knee Flexion  AROM    Knee Flexion Limitations  105      Knee/Hip Exercises: Sidelying   Clams  2x10      Manual Therapy   Manual Therapy  Edema management;Joint mobilization;Soft tissue mobilization    Manual therapy comments  completed independently from all other aspects of treatment    Edema Management  edema massage of L knee for decreased swelling    Joint Mobilization  overpressure for extensions tibial ER with AP grade II-III    Soft tissue  mobilization  L quad and hamstring stm             PT Education - 12/27/19 0952    Education Details  Patient educated on positioning at rest, completing HEP, and mechanics of exercise    Person(s) Educated  Patient    Methods  Explanation    Comprehension  Verbalized understanding       PT Short Term Goals - 12/20/19 1028      PT SHORT TERM GOAL #1   Title  Patient will be independent with HEP in order to improve functional outcomes.    Time  3    Period  Weeks    Status  On-going    Target Date  01/08/20      PT SHORT TERM GOAL #2   Title  Patient will report at least 25% improvement in symptoms for improved quality of life.    Time  3    Period  Weeks    Status  On-going    Target Date  01/08/20        PT Long Term Goals - 12/20/19 1028      PT LONG TERM GOAL #1   Title  Patient will report at least 75% improvement in symptoms for improved quality of life.    Time  6    Period  Weeks    Status  On-going      PT LONG TERM GOAL #2   Title  Patient will improve FOTO score by at least 10 points in order to indicate improved tolerance to activity.    Time  6    Period  Weeks    Status  On-going      PT LONG TERM GOAL #3   Title  Patient will be able to ambulate for at least 60 minutes with pain no greater than 1/10 in order to demonstrate improved ability to ambulate for exercise.    Time  6    Period  Weeks    Status  On-going      PT LONG TERM GOAL #4   Title  Patient will be able to complete 5x STS in under 11.4 seconds in order to reduce the risk of falls.  Time  6    Period  Weeks    Status  On-going            Plan - 12/27/19 0954    Clinical Impression Statement  Patient demonstrating improving L knee rom today while on bike with increased flexion with repetitions. He improves TKE slightly following manual therapy to lacking 2 degrees extension. He tolerates manual therapy interventions well today with only minimal discomfort throughout.  He demonstrates improving quad strength with increasing ease of performing LAQ with increased extension ROM. He demonstrates good mechanics ambulating with SPC with verbal cueing and demonstration for heel-toe gait but continues to lack TKE and flexion throughout gait cycle. He continues to utilize RLE more with transfers to and from standing. He continues to be limited by pain during session as well as weakness. Patient will continue to benefit from skilled physical therapy in order to reduce impairment and improve function.    Personal Factors and Comorbidities  Behavior Pattern;Comorbidity 2;Fitness;Past/Current Experience;Profession    Comorbidities  diabetes, high blood pressure    Examination-Activity Limitations  Bathing;Bed Mobility;Bend;Carry;Caring for Others;Dressing;Hygiene/Grooming;Lift;Locomotion Level;Sit;Sleep;Squat;Stairs;Stand;Toileting;Transfers    Examination-Participation Restrictions  Church;Cleaning;Community Activity;Driving;Laundry;Volunteer;Yard Work    Rehab Potential  Good    PT Frequency  3x / week    PT Duration  6 weeks    PT Treatment/Interventions  ADLs/Self Care Home Management;Aquatic Therapy;Electrical Stimulation;Cryotherapy;Biofeedback;Iontophoresis 4mg /ml Dexamethasone;Moist Heat;Traction;Ultrasound;DME Instruction;Contrast Bath;Gait training;Stair training;Functional mobility training;Therapeutic activities;Therapeutic exercise;Balance training;Neuromuscular re-education;Patient/family education;Manual techniques;Manual lymph drainage;Compression bandaging;Scar mobilization;Passive range of motion;Dry needling;Energy conservation;Splinting;Taping;Vasopneumatic Device;Spinal Manipulations;Joint Manipulations    PT Next Visit Plan  continue knee flexion/extension ROM exercises, continue LLE strengthening exercises as able    PT Home Exercise Plan  12/20/19 quad sets, SAQ, LAQ, heel slides in seated and supine    Consulted and Agree with Plan of Care  Patient        Patient will benefit from skilled therapeutic intervention in order to improve the following deficits and impairments:  Abnormal gait, Decreased activity tolerance, Decreased balance, Decreased mobility, Decreased knowledge of use of DME, Decreased endurance, Decreased range of motion, Decreased strength, Difficulty walking, Hypomobility, Increased edema, Increased fascial restricitons, Increased muscle spasms, Impaired flexibility, Improper body mechanics, Pain  Visit Diagnosis: Left knee pain, unspecified chronicity  Other abnormalities of gait and mobility  Muscle weakness (generalized)  Other symptoms and signs involving the musculoskeletal system     Problem List Patient Active Problem List   Diagnosis Date Noted  . OA (osteoarthritis) of knee 12/11/2019  . Osteoarthritis of left knee 12/11/2019  . Lab test positive for detection of COVID-19 virus 08/19/2019  . Hyperlipidemia LDL goal <100 06/20/2018  . Erectile dysfunction 12/16/2016  . Essential hypertension 06/19/2016  . Neuropathy due to type 2 diabetes mellitus ([place]) 06/19/2016  . Diabetes ([place]) 03/20/2015  . Diverticulosis of colon without hemorrhage   . Elevated blood pressure 08/01/2014  . Prostate hypertrophy 06/25/2014  . Esophageal reflux 06/25/2014    10:27 AM, 12/27/19 Mearl Latin PT, DPT Physical Therapist at [place], [place], [address] Phone: [phone]   Fax:  [phone]  Name: KEYONTAE HUCKEBY MRN: [id number] Date of Birth: [birth year]

## 2019-12-29 ENCOUNTER — Other Ambulatory Visit: Payer: Self-pay

## 2019-12-29 ENCOUNTER — Encounter (HOSPITAL_COMMUNITY): Payer: Self-pay | Admitting: Physical Therapy

## 2019-12-29 ENCOUNTER — Ambulatory Visit (HOSPITAL_COMMUNITY): Payer: BC Managed Care – PPO | Admitting: Physical Therapy

## 2019-12-29 DIAGNOSIS — M25562 Pain in left knee: Secondary | ICD-10-CM

## 2019-12-29 DIAGNOSIS — M6281 Muscle weakness (generalized): Secondary | ICD-10-CM | POA: Diagnosis not present

## 2019-12-29 DIAGNOSIS — R2689 Other abnormalities of gait and mobility: Secondary | ICD-10-CM | POA: Diagnosis not present

## 2019-12-29 DIAGNOSIS — R29898 Other symptoms and signs involving the musculoskeletal system: Secondary | ICD-10-CM

## 2019-12-29 NOTE — Therapy (Signed)
[facility] [place], [place], [address] Phone: [phone]   Fax:  [phone]  Physical Therapy Treatment  Patient Details  Name: [name] [name] MRN: [id number] Date of Birth: 1960-12-24 Referring Provider (PT): Gaynelle Arabian MD   Encounter Date: 12/29/2019  PT End of Session - 12/29/19 1443    Visit Number  6    Number of Visits  18    Date for PT Re-Evaluation  01/29/20    Authorization Type  BSBS (30 visit limit no auth)    Authorization Time Period  3/29-->01/29/20    Progress Note Due on Visit  10    PT Start Time  1430    PT Stop Time  1515   Time on bike not included in billed time   PT Time Calculation (min)  45 min    Equipment Utilized During Treatment  --    Activity Tolerance  Patient tolerated treatment well;No increased pain    Behavior During Therapy  WFL for tasks assessed/performed       Past Medical History:  Diagnosis Date  . Anxiety   . Arthritis   . Diabetes mellitus without complication ([place])   . Diverticulosis    pt unaware  . ED (erectile dysfunction)   . Fatty liver   . GERD (gastroesophageal reflux disease)   . History of COVID-19 07/2019  . History of left inguinal hernia   . History of retinal detachment    Right  . Hyperlipidemia   . Hypertension   . Insomnia   . Low ferritin level   . Numbness and tingling of both feet   . Shortness of breath dyspnea    since having COVID 07/2019 with exertions    Past Surgical History:  Procedure Laterality Date  . CHOLECYSTECTOMY    . COLONOSCOPY N/A 11/30/2014   Procedure: COLONOSCOPY;  Surgeon: Daneil Dolin, MD;  [facility];  Service: Endoscopy;  Laterality: N/A;  7:30 Am  . ESOPHAGEAL DILATION    . HERNIA REPAIR     1981  . RETINAL DETACHMENT SURGERY Right   . TOTAL KNEE ARTHROPLASTY Left 12/11/2019   Procedure: TOTAL KNEE ARTHROPLASTY;  Surgeon: Gaynelle Arabian, MD;  [facility];  Service: Orthopedics;   Laterality: Left;  11min  . VASCULAR SURGERY      There were no vitals filed for this visit.  Subjective Assessment - 12/29/19 1436    Subjective  Patient reported that he was having a lot of pain in his knee yesterday, but that it is feeling better today.    Patient Stated Goals  To get back to work    Currently in Pain?  No/denies                       [facility] Adult PT Treatment/Exercise - 12/29/19 0001      Knee/Hip Exercises: Stretches   Passive Hamstring Stretch  Left;3 reps;20 seconds    Passive Hamstring Stretch Limitations  on 8'' step    Gastroc Stretch  3 reps;30 seconds    Gastroc Stretch Limitations  Slant board      Knee/Hip Exercises: Aerobic   Recumbent Bike  4 minutes rocking for flexion/extension ROM      Knee/Hip Exercises: Seated   Long Arc Quad  Left;10 reps    Long Arc Quad Limitations  5-10 second holds      Knee/Hip Exercises: Supine   Quad Sets  10  reps    Quad Sets Limitations  5-10 second holds    Knee Extension  AROM    Knee Extension Limitations  10    Knee Flexion  AROM    Knee Flexion Limitations  107      Knee/Hip Exercises: Sidelying   Clams  2x10   LT LE only 2-3 second holds     Manual Therapy   Manual Therapy  Edema management;Joint mobilization    Manual therapy comments  completed independently from all other aspects of treatment    Edema Management  edema massage of L knee for decreased swelling    Joint Mobilization  Patellar glides all directions grade III for mobility                PT Short Term Goals - 12/20/19 1028      PT SHORT TERM GOAL #1   Title  Patient will be independent with HEP in order to improve functional outcomes.    Time  3    Period  Weeks    Status  On-going    Target Date  01/08/20      PT SHORT TERM GOAL #2   Title  Patient will report at least 25% improvement in symptoms for improved quality of life.    Time  3    Period  Weeks    Status  On-going    Target Date  01/08/20         PT Long Term Goals - 12/20/19 1028      PT LONG TERM GOAL #1   Title  Patient will report at least 75% improvement in symptoms for improved quality of life.    Time  6    Period  Weeks    Status  On-going      PT LONG TERM GOAL #2   Title  Patient will improve FOTO score by at least 10 points in order to indicate improved tolerance to activity.    Time  6    Period  Weeks    Status  On-going      PT LONG TERM GOAL #3   Title  Patient will be able to ambulate for at least 60 minutes with pain no greater than 1/10 in order to demonstrate improved ability to ambulate for exercise.    Time  6    Period  Weeks    Status  On-going      PT LONG TERM GOAL #4   Title  Patient will be able to complete 5x STS in under 11.4 seconds in order to reduce the risk of falls.    Time  6    Period  Weeks    Status  On-going            Plan - 12/29/19 1534    Clinical Impression Statement  Continued with a focus on improving patient's knee mobility this session. Added standing hamstring stretch with 8 inch step, and provided cueing to reduce trunk flexion and instead perform hip flexion to get a hamstring stretch. This session with manual therapy performed patellar glides in all directions to improve knee mobility. Patient's knee extension and flexion AROM improved by 2 degrees from the last session. Plan to continue with a focus on AROM, and focus mostly on knee extension.    Personal Factors and Comorbidities  Behavior Pattern;Comorbidity 2;Fitness;Past/Current Experience;Profession    Comorbidities  diabetes, high blood pressure    Examination-Activity Limitations  Bathing;Bed Mobility;Bend;Carry;Caring for Others;Dressing;Hygiene/Grooming;Lift;Locomotion Level;Sit;Sleep;Squat;Stairs;Stand;Toileting;Transfers  Examination-Participation Restrictions  Church;Cleaning;Community Activity;Driving;Laundry;Volunteer;Yard Work    Rehab Potential  Good    PT Frequency  3x / week    PT  Duration  6 weeks    PT Treatment/Interventions  ADLs/Self Care Home Management;Aquatic Therapy;Electrical Stimulation;Cryotherapy;Biofeedback;Iontophoresis 4mg /ml Dexamethasone;Moist Heat;Traction;Ultrasound;DME Instruction;Contrast Bath;Gait training;Stair training;Functional mobility training;Therapeutic activities;Therapeutic exercise;Balance training;Neuromuscular re-education;Patient/family education;Manual techniques;Manual lymph drainage;Compression bandaging;Scar mobilization;Passive range of motion;Dry needling;Energy conservation;Splinting;Taping;Vasopneumatic Device;Spinal Manipulations;Joint Manipulations    PT Next Visit Plan  continue knee flexion/extension ROM exercises focus on extension > flexion, continue LLE strengthening exercises as able    PT Home Exercise Plan  12/20/19 quad sets, SAQ, LAQ, heel slides in seated and supine    Consulted and Agree with Plan of Care  Patient       Patient will benefit from skilled therapeutic intervention in order to improve the following deficits and impairments:  Abnormal gait, Decreased activity tolerance, Decreased balance, Decreased mobility, Decreased knowledge of use of DME, Decreased endurance, Decreased range of motion, Decreased strength, Difficulty walking, Hypomobility, Increased edema, Increased fascial restricitons, Increased muscle spasms, Impaired flexibility, Improper body mechanics, Pain  Visit Diagnosis: Left knee pain, unspecified chronicity  Other abnormalities of gait and mobility  Muscle weakness (generalized)  Other symptoms and signs involving the musculoskeletal system     Problem List Patient Active Problem List   Diagnosis Date Noted  . OA (osteoarthritis) of knee 12/11/2019  . Osteoarthritis of left knee 12/11/2019  . Lab test positive for detection of COVID-19 virus 08/19/2019  . Hyperlipidemia LDL goal <100 06/20/2018  . Erectile dysfunction 12/16/2016  . Essential hypertension 06/19/2016  . Neuropathy  due to type 2 diabetes mellitus (HCC) 06/19/2016  . Diabetes ([facility]CC) 03/20/2015  . Diverticulosis of colon without hemorrhage   . Elevated blood pressure 08/01/2014  . Prostate hypertrophy 06/25/2014  . Esophageal reflux 06/25/2014   08/25/2014 PT, DPT 3:36 PM, 12/29/19 [phone]  [facility] [address] [place], [place], [place] Phone: [phone]   Fax:  [phone]  Name: [name] [name] MRN: [id number] Date of Birth: [birth year]

## 2020-01-01 ENCOUNTER — Other Ambulatory Visit: Payer: Self-pay

## 2020-01-01 ENCOUNTER — Encounter (HOSPITAL_COMMUNITY): Payer: Self-pay | Admitting: Physical Therapy

## 2020-01-01 ENCOUNTER — Ambulatory Visit (HOSPITAL_COMMUNITY): Payer: BC Managed Care – PPO | Admitting: Physical Therapy

## 2020-01-01 DIAGNOSIS — M6281 Muscle weakness (generalized): Secondary | ICD-10-CM

## 2020-01-01 DIAGNOSIS — R2689 Other abnormalities of gait and mobility: Secondary | ICD-10-CM | POA: Diagnosis not present

## 2020-01-01 DIAGNOSIS — R29898 Other symptoms and signs involving the musculoskeletal system: Secondary | ICD-10-CM

## 2020-01-01 DIAGNOSIS — M25562 Pain in left knee: Secondary | ICD-10-CM | POA: Diagnosis not present

## 2020-01-01 NOTE — Patient Instructions (Signed)
Access Code: [email or web address] URL: [email or web address] Date: 01/01/2020 Prepared by: Greig Castilla Elisandra Deshmukh  Exercises Mini Squat with Counter Support - 1 x daily - 7 x weekly - 2 sets - 10 reps

## 2020-01-01 NOTE — Therapy (Signed)
[facility] [address] [place], [place], [address] Phone: [phone]   Fax:  [phone]  Physical Therapy Treatment  Patient Details  Name: [name] [name] MRN: [id number] Date of Birth: 1961-02-02 Referring Provider (PT): Ollen Gross MD   Encounter Date: 01/01/2020  PT End of Session - 01/01/20 0953    Visit Number  7    Number of Visits  18    Date for PT Re-Evaluation  01/29/20    Authorization Type  BSBS (30 visit limit no auth)    Authorization Time Period  3/29-->01/29/20    Progress Note Due on Visit  10    PT Start Time  0949    PT Stop Time  1028    PT Time Calculation (min)  39 min    Activity Tolerance  Patient tolerated treatment well;No increased pain    Behavior During Therapy  WFL for tasks assessed/performed       Past Medical History:  Diagnosis Date  . Anxiety   . Arthritis   . Diabetes mellitus without complication (HCC)   . Diverticulosis    pt unaware  . ED (erectile dysfunction)   . Fatty liver   . GERD (gastroesophageal reflux disease)   . History of COVID-19 07/2019  . History of left inguinal hernia   . History of retinal detachment    Right  . Hyperlipidemia   . Hypertension   . Insomnia   . Low ferritin level   . Numbness and tingling of both feet   . Shortness of breath dyspnea    since having COVID 07/2019 with exertions    Past Surgical History:  Procedure Laterality Date  . CHOLECYSTECTOMY    . COLONOSCOPY N/A 11/30/2014   Procedure: COLONOSCOPY;  Surgeon: Corbin Ade, MD;  [facility];  Service: Endoscopy;  Laterality: N/A;  7:30 Am  . ESOPHAGEAL DILATION    . HERNIA REPAIR     1981  . RETINAL DETACHMENT SURGERY Right   . TOTAL KNEE ARTHROPLASTY Left 12/11/2019   Procedure: TOTAL KNEE ARTHROPLASTY;  Surgeon: Ollen Gross, MD;  Location: [facility];  Service: Orthopedics;  Laterality: Left;   . VASCULAR SURGERY      There were no vitals filed for this  visit.  Subjective Assessment - 01/01/20 0948    Subjective  Patient reports he was in a lot of pain until about 3am. He has been trying to take less pain meds but take them intermittently. HEP is going well and he did them Sunday but not Saturday.    Patient Stated Goals  To get back to work    Currently in Pain?  Yes    Pain Score  6     Pain Orientation  Left                       [facility] Adult PT Treatment/Exercise - 01/01/20 0001      Ambulation/Gait   Ambulation/Gait  Yes    Ambulation/Gait Assistance  6: Modified independent (Device/Increase time)    Ambulation Distance (Feet)  200 Feet    Assistive device  Straight cane    Gait Pattern  Antalgic;Left flexed knee in stance;Decreased hip/knee flexion - left;Decreased stance time - left    Ambulation Surface  Level;Indoor    Gait velocity  decreased    Gait Comments  with verbal cueing for heel toe gait       Knee/Hip Exercises:  Stretches   Passive Hamstring Stretch  Left;3 reps;20 seconds    Passive Hamstring Stretch Limitations  on 8'' step and while seated     Gastroc Stretch  3 reps;30 seconds    Gastroc Stretch Limitations  Slant board      Knee/Hip Exercises: Aerobic   Recumbent Bike  5 minutes rocking for flexion/extension ROM      Knee/Hip Exercises: Standing   Heel Raises  2 sets;10 reps    Hip Abduction  Both;2 sets;10 reps    Functional Squat  2 sets;10 reps    Functional Squat Limitations  with UE support    Other Standing Knee Exercises  TKE with band and cueing for quad activation 10 x 5 second hold      Knee/Hip Exercises: Supine   Knee Extension  AROM    Knee Extension Limitations  12   lacking   Knee Flexion  AROM    Knee Flexion Limitations  106             PT Education - 01/01/20 0953    Education Details  Patient educated on positioning at rest, completing HEP, and mechanics of exercise    Person(s) Educated  Patient    Methods  Explanation    Comprehension  Verbalized  understanding       PT Short Term Goals - 12/20/19 1028      PT SHORT TERM GOAL #1   Title  Patient will be independent with HEP in order to improve functional outcomes.    Time  3    Period  Weeks    Status  On-going    Target Date  01/08/20      PT SHORT TERM GOAL #2   Title  Patient will report at least 25% improvement in symptoms for improved quality of life.    Time  3    Period  Weeks    Status  On-going    Target Date  01/08/20        PT Long Term Goals - 12/20/19 1028      PT LONG TERM GOAL #1   Title  Patient will report at least 75% improvement in symptoms for improved quality of life.    Time  6    Period  Weeks    Status  On-going      PT LONG TERM GOAL #2   Title  Patient will improve FOTO score by at least 10 points in order to indicate improved tolerance to activity.    Time  6    Period  Weeks    Status  On-going      PT LONG TERM GOAL #3   Title  Patient will be able to ambulate for at least 60 minutes with pain no greater than 1/10 in order to demonstrate improved ability to ambulate for exercise.    Time  6    Period  Weeks    Status  On-going      PT LONG TERM GOAL #4   Title  Patient will be able to complete 5x STS in under 11.4 seconds in order to reduce the risk of falls.    Time  6    Period  Weeks    Status  On-going            Plan - 01/01/20 0954    Clinical Impression Statement  Patient showing improvement in knee flexion ROM but continues to be very limited with knee extension. He ambulates  with limited knee flexion/extension ROM and has flat foot with initial contact despite verbal cueing and demonstration for heel strike. He is limited by c/o during session and while at home. He continues to not sit with heel prop at home for promoting knee extension and he is educated on performing at home for increasing ROM. He requires UE support with hip abduction exercise secondary to impaired strength and balance. Patient will continue to  benefit from skilled physical therapy in order to reduce impairment and improve function.    Personal Factors and Comorbidities  Behavior Pattern;Comorbidity 2;Fitness;Past/Current Experience;Profession    Comorbidities  diabetes, high blood pressure    Examination-Activity Limitations  Bathing;Bed Mobility;Bend;Carry;Caring for Others;Dressing;Hygiene/Grooming;Lift;Locomotion Level;Sit;Sleep;Squat;Stairs;Stand;Toileting;Transfers    Examination-Participation Restrictions  Church;Cleaning;Community Activity;Driving;Laundry;Volunteer;Yard Work    Rehab Potential  Good    PT Frequency  3x / week    PT Duration  6 weeks    PT Treatment/Interventions  ADLs/Self Care Home Management;Aquatic Therapy;Electrical Stimulation;Cryotherapy;Biofeedback;Iontophoresis 4mg /ml Dexamethasone;Moist Heat;Traction;Ultrasound;DME Instruction;Contrast Bath;Gait training;Stair training;Functional mobility training;Therapeutic activities;Therapeutic exercise;Balance training;Neuromuscular re-education;Patient/family education;Manual techniques;Manual lymph drainage;Compression bandaging;Scar mobilization;Passive range of motion;Dry needling;Energy conservation;Splinting;Taping;Vasopneumatic Device;Spinal Manipulations;Joint Manipulations    PT Next Visit Plan  continue knee flexion/extension ROM exercises focus on extension > flexion, continue LLE strengthening exercises as able    PT Home Exercise Plan  12/20/19 quad sets, SAQ, LAQ, heel slides in seated and supine 4/12 squat    Consulted and Agree with Plan of Care  Patient       Patient will benefit from skilled therapeutic intervention in order to improve the following deficits and impairments:  Abnormal gait, Decreased activity tolerance, Decreased balance, Decreased mobility, Decreased knowledge of use of DME, Decreased endurance, Decreased range of motion, Decreased strength, Difficulty walking, Hypomobility, Increased edema, Increased fascial restricitons, Increased  muscle spasms, Impaired flexibility, Improper body mechanics, Pain  Visit Diagnosis: Left knee pain, unspecified chronicity  Other abnormalities of gait and mobility  Muscle weakness (generalized)  Other symptoms and signs involving the musculoskeletal system     Problem List Patient Active Problem List   Diagnosis Date Noted  . OA (osteoarthritis) of knee 12/11/2019  . Osteoarthritis of left knee 12/11/2019  . Lab test positive for detection of COVID-19 virus 08/19/2019  . Hyperlipidemia LDL goal <100 06/20/2018  . Erectile dysfunction 12/16/2016  . Essential hypertension 06/19/2016  . Neuropathy due to type 2 diabetes mellitus (HCC) 06/19/2016  . Diabetes (HCC) 03/20/2015  . Diverticulosis of colon without hemorrhage   . Elevated blood pressure 08/01/2014  . Prostate hypertrophy 06/25/2014  . Esophageal reflux 06/25/2014    10:30 AM, 01/01/20 03/02/20 PT, DPT Physical Therapist at [facility]   [facility] [address] [place], [place], [place] Phone: [phone]   Fax:  [phone]  Name: [name] [name] MRN: Rozanna Box Date of Birth: [birth year]

## 2020-01-02 ENCOUNTER — Other Ambulatory Visit: Payer: Self-pay | Admitting: Family Medicine

## 2020-01-03 ENCOUNTER — Other Ambulatory Visit: Payer: Self-pay

## 2020-01-03 ENCOUNTER — Ambulatory Visit (HOSPITAL_COMMUNITY): Payer: BC Managed Care – PPO | Admitting: Physical Therapy

## 2020-01-03 ENCOUNTER — Encounter (HOSPITAL_COMMUNITY): Payer: Self-pay | Admitting: Physical Therapy

## 2020-01-03 DIAGNOSIS — M25562 Pain in left knee: Secondary | ICD-10-CM | POA: Diagnosis not present

## 2020-01-03 DIAGNOSIS — R29898 Other symptoms and signs involving the musculoskeletal system: Secondary | ICD-10-CM

## 2020-01-03 DIAGNOSIS — R2689 Other abnormalities of gait and mobility: Secondary | ICD-10-CM

## 2020-01-03 DIAGNOSIS — M6281 Muscle weakness (generalized): Secondary | ICD-10-CM | POA: Diagnosis not present

## 2020-01-03 NOTE — Therapy (Signed)
[facility] [address] [place], [place], [address] Phone: [phone]   Fax:  [phone]  Physical Therapy Treatment  Patient Details  Name: [name] [name] MRN: [id number] Date of Birth: January 13, 1961 Referring Provider (PT): Ollen Gross MD   Encounter Date: 01/03/2020  PT End of Session - 01/03/20 0952    Visit Number  8    Number of Visits  18    Date for PT Re-Evaluation  01/29/20    Authorization Type  BSBS (30 visit limit no auth)    Authorization Time Period  3/29-->01/29/20    Progress Note Due on Visit  10    PT Start Time  0948    PT Stop Time  1030    PT Time Calculation (min)  42 min    Activity Tolerance  Patient tolerated treatment well;No increased pain    Behavior During Therapy  WFL for tasks assessed/performed       Past Medical History:  Diagnosis Date  . Anxiety   . Arthritis   . Diabetes mellitus without complication (HCC)   . Diverticulosis    pt unaware  . ED (erectile dysfunction)   . Fatty liver   . GERD (gastroesophageal reflux disease)   . History of COVID-19 07/2019  . History of left inguinal hernia   . History of retinal detachment    Right  . Hyperlipidemia   . Hypertension   . Insomnia   . Low ferritin level   . Numbness and tingling of both feet   . Shortness of breath dyspnea    since having COVID 07/2019 with exertions    Past Surgical History:  Procedure Laterality Date  . CHOLECYSTECTOMY    . COLONOSCOPY N/A 11/30/2014   Procedure: COLONOSCOPY;  Surgeon: Corbin Ade, MD;  [facility];  Service: Endoscopy;  Laterality: N/A;  7:30 Am  . ESOPHAGEAL DILATION    . HERNIA REPAIR     1981  . RETINAL DETACHMENT SURGERY Right   . TOTAL KNEE ARTHROPLASTY Left 12/11/2019   Procedure: TOTAL KNEE ARTHROPLASTY;  Surgeon: Ollen Gross, MD;  Location[facility];  Service: Orthopedics;  Laterality: Left;   . VASCULAR SURGERY      There were no vitals filed for this  visit.  Subjective Assessment - 01/03/20 0951    Subjective  Patient says he is doing not bad today. Says he is still taking his pain medication. Says knee pain currently about a 6, but has been cutting back on pain meds as able.    Patient Stated Goals  To get back to work    Currently in Pain?  Yes    Pain Score  6     Pain Location  Knee    Pain Orientation  Left    Pain Descriptors / Indicators  Aching;Tightness    Pain Type  Surgical pain                       [facility] Adult PT Treatment/Exercise - 01/03/20 0001      Knee/Hip Exercises: Stretches   Gastroc Stretch  3 reps;30 seconds    Gastroc Stretch Limitations  Slant board      Knee/Hip Exercises: Aerobic   Recumbent Bike  5 minutes warmup, rocking initially, progressed to full revolution       Knee/Hip Exercises: Standing   Heel Raises  2 sets;10 reps    Terminal Knee Extension  Left;15 reps;Theraband  Theraband Level (Terminal Knee Extension)  Other (comment)    Terminal Knee Extension Limitations  purple    Forward Step Up  Both;1 set;10 reps;Hand Hold: 2;Step Height: 6"    Step Down  Both;1 set;10 reps;Hand Hold: 1;Step Height: 4"    Gait Training  226 feet with adjusted SPC    Other Standing Knee Exercises  tandem stance on solid floor 3 x 20"      Knee/Hip Exercises: Seated   Other Seated Knee/Hip Exercises  heel prop on stool 2 x 90" holds     Sit to Sand  1 set;10 reps;with UE support      Knee/Hip Exercises: Supine   Knee Extension  AROM    Knee Extension Limitations  12    Knee Flexion  AROM    Knee Flexion Limitations  106             PT Education - 01/03/20 1220    Education Details  On added exercise and technique, proper SPC height for gait training, increased HEP frequency and heel prop for improved knee extension AROM    Person(s) Educated  Patient    Methods  Explanation    Comprehension  Verbalized understanding       PT Short Term Goals - 12/20/19 1028      PT SHORT  TERM GOAL #1   Title  Patient will be independent with HEP in order to improve functional outcomes.    Time  3    Period  Weeks    Status  On-going    Target Date  01/08/20      PT SHORT TERM GOAL #2   Title  Patient will report at least 25% improvement in symptoms for improved quality of life.    Time  3    Period  Weeks    Status  On-going    Target Date  01/08/20        PT Long Term Goals - 12/20/19 1028      PT LONG TERM GOAL #1   Title  Patient will report at least 75% improvement in symptoms for improved quality of life.    Time  6    Period  Weeks    Status  On-going      PT LONG TERM GOAL #2   Title  Patient will improve FOTO score by at least 10 points in order to indicate improved tolerance to activity.    Time  6    Period  Weeks    Status  On-going      PT LONG TERM GOAL #3   Title  Patient will be able to ambulate for at least 60 minutes with pain no greater than 1/10 in order to demonstrate improved ability to ambulate for exercise.    Time  6    Period  Weeks    Status  On-going      PT LONG TERM GOAL #4   Title  Patient will be able to complete 5x STS in under 11.4 seconds in order to reduce the risk of falls.    Time  6    Period  Weeks    Status  On-going            Plan - 01/03/20 1216    Clinical Impression Statement  Patient tolerated session well today with no increased complaint of pain. Patient was able to progress functional strengthening with sit to stands and step up and downs. Patient limited by  ongoing quad weakness in LLE and requires HHA for ascending and descending stairs, as well as for sit/stand transfer. Patient educated on proper cane height, and practiced gait training with proper height SPC. Patient shows improved sequencing, and good return for 2 point gait pattern. Patient remains limited by knee AROM restriction, notably extension. Patient educated on importance of heel prop for knee extension, and increasing HEP frequency to  4 x daily for improve d mobility.    Personal Factors and Comorbidities  Behavior Pattern;Comorbidity 2;Fitness;Past/Current Experience;Profession    Comorbidities  diabetes, high blood pressure    Examination-Activity Limitations  Bathing;Bed Mobility;Bend;Carry;Caring for Others;Dressing;Hygiene/Grooming;Lift;Locomotion Level;Sit;Sleep;Squat;Stairs;Stand;Toileting;Transfers    Examination-Participation Restrictions  Church;Cleaning;Community Activity;Driving;Laundry;Volunteer;Yard Work    Rehab Potential  Good    PT Frequency  3x / week    PT Duration  6 weeks    PT Treatment/Interventions  ADLs/Self Care Home Management;Aquatic Therapy;Electrical Stimulation;Cryotherapy;Biofeedback;Iontophoresis 4mg /ml Dexamethasone;Moist Heat;Traction;Ultrasound;DME Instruction;Contrast Bath;Gait training;Stair training;Functional mobility training;Therapeutic activities;Therapeutic exercise;Balance training;Neuromuscular re-education;Patient/family education;Manual techniques;Manual lymph drainage;Compression bandaging;Scar mobilization;Passive range of motion;Dry needling;Energy conservation;Splinting;Taping;Vasopneumatic Device;Spinal Manipulations;Joint Manipulations    PT Next Visit Plan  continue knee flexion/extension ROM exercises focus on extension > flexion, continue LLE strengthening exercises as able. Increase step height for step down, progress standing balance to faom    PT Home Exercise Plan  12/20/19 quad sets, SAQ, LAQ, heel slides in seated and supine 4/12 squat    Consulted and Agree with Plan of Care  Patient       Patient will benefit from skilled therapeutic intervention in order to improve the following deficits and impairments:  Abnormal gait, Decreased activity tolerance, Decreased balance, Decreased mobility, Decreased knowledge of use of DME, Decreased endurance, Decreased range of motion, Decreased strength, Difficulty walking, Hypomobility, Increased edema, Increased fascial  restricitons, Increased muscle spasms, Impaired flexibility, Improper body mechanics, Pain  Visit Diagnosis: Left knee pain, unspecified chronicity  Other abnormalities of gait and mobility  Muscle weakness (generalized)  Other symptoms and signs involving the musculoskeletal system     Problem List Patient Active Problem List   Diagnosis Date Noted  . OA (osteoarthritis) of knee 12/11/2019  . Osteoarthritis of left knee 12/11/2019  . Lab test positive for detection of COVID-19 virus 08/19/2019  . Hyperlipidemia LDL goal <100 06/20/2018  . Erectile dysfunction 12/16/2016  . Essential hypertension 06/19/2016  . Neuropathy due to type 2 diabetes mellitus (HCC) 06/19/2016  . Diabetes (HCC) 03/20/2015  . Diverticulosis of colon without hemorrhage   . Elevated blood pressure 08/01/2014  . Prostate hypertrophy 06/25/2014  . Esophageal reflux 06/25/2014    12:22 PM, 01/03/20 01/05/20 PT DPT  Physical Therapist with [facility]  [facility]  [phone]   [facility] [address] [place], [place], [place] Phone: [phone]   Fax:  [phone]  Name: [name] [name] MRN: Rozanna [id number] Date of Birth: 06-Jul-1961

## 2020-01-05 ENCOUNTER — Other Ambulatory Visit: Payer: Self-pay

## 2020-01-05 ENCOUNTER — Encounter (HOSPITAL_COMMUNITY): Payer: Self-pay

## 2020-01-05 ENCOUNTER — Ambulatory Visit (HOSPITAL_COMMUNITY): Payer: BC Managed Care – PPO

## 2020-01-05 DIAGNOSIS — M6281 Muscle weakness (generalized): Secondary | ICD-10-CM | POA: Diagnosis not present

## 2020-01-05 DIAGNOSIS — M25562 Pain in left knee: Secondary | ICD-10-CM

## 2020-01-05 DIAGNOSIS — R2689 Other abnormalities of gait and mobility: Secondary | ICD-10-CM

## 2020-01-05 DIAGNOSIS — R29898 Other symptoms and signs involving the musculoskeletal system: Secondary | ICD-10-CM | POA: Diagnosis not present

## 2020-01-05 NOTE — Therapy (Signed)
[facility] [address] [place], [place], [address] Phone: [phone]   Fax:  [phone]  Physical Therapy Treatment  Patient Details  Name: [name] [name] MRN: [id number] Date of Birth: 14-Sep-1961 Referring Provider (PT): Ollen Gross MD   Encounter Date: 01/05/2020  PT End of Session - 01/05/20 1112    Visit Number  9    Number of Visits  18    Date for PT Re-Evaluation  01/29/20    Authorization Type  BSBS (30 visit limit no auth)    Authorization - Visit Number  9    Authorization - Number of Visits  30    Progress Note Due on Visit  10    PT Start Time  1043   5' on bike, not included wiht charges   PT Stop Time  1130    PT Time Calculation (min)  47 min    Activity Tolerance  Patient tolerated treatment well;No increased pain    Behavior During Therapy  WFL for tasks assessed/performed       Past Medical History:  Diagnosis Date  . Anxiety   . Arthritis   . Diabetes mellitus without complication (HCC)   . Diverticulosis    pt unaware  . ED (erectile dysfunction)   . Fatty liver   . GERD (gastroesophageal reflux disease)   . History of COVID-19 07/2019  . History of left inguinal hernia   . History of retinal detachment    Right  . Hyperlipidemia   . Hypertension   . Insomnia   . Low ferritin level   . Numbness and tingling of both feet   . Shortness of breath dyspnea    since having COVID 07/2019 with exertions    Past Surgical History:  Procedure Laterality Date  . CHOLECYSTECTOMY    . COLONOSCOPY N/A 11/30/2014   Procedure: COLONOSCOPY;  Surgeon: Corbin Ade, MD;  [facility];  Service: Endoscopy;  Laterality: N/A;  7:30 Am  . ESOPHAGEAL DILATION    . HERNIA REPAIR     1981  . RETINAL DETACHMENT SURGERY Right   . TOTAL KNEE ARTHROPLASTY Left 12/11/2019   Procedure: TOTAL KNEE ARTHROPLASTY;  Surgeon: Ollen Gross, MD;  Location: [facility];  Service: Orthopedics;  Laterality: Left;    . VASCULAR SURGERY      There were no vitals filed for this visit.  Subjective Assessment - 01/05/20 1049    Subjective  Pt stated last night was a turning point, reports ability to get full night sleep.  Curernt pain scale 4-5/10.  Arrived with QC and reports akward due to it pointing in.  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    Patient Stated Goals  To get back to work    Currently in Pain?  Yes    Pain Score  5     Pain Location  Knee    Pain Orientation  Left    Pain Descriptors / Indicators  Aching;Tightness;Sore    Pain Type  Surgical pain    Pain Frequency  Intermittent                       [facility] Adult PT Treatment/Exercise - 01/05/20 0001      Knee/Hip Exercises: Stretches   Active Hamstring Stretch  Left;3 reps;30 seconds    Active Hamstring Stretch Limitations  supine wiht rope    Quad Stretch  3  reps;30 seconds    Quad Stretch Limitations  prone with rope    Knee: Self-Stretch to increase Flexion  5 reps;10 seconds    Knee: Self-Stretch Limitations  knee drive on 65KP step 5x 10" holds    Gastroc Stretch  3 reps;30 seconds    Gastroc Stretch Limitations  Slant board      Knee/Hip Exercises: Aerobic   Recumbent Bike  5 minutes warmup, rocking initially, progressed to full revolution seat 15      Knee/Hip Exercises: Standing   Heel Raises  15 reps;5 seconds    Terminal Knee Extension  Left;15 reps;Theraband    Theraband Level (Terminal Knee Extension)  Level 2 (Red)    Terminal Knee Extension Limitations  5" holds    Lateral Step Up  Left;10 reps;Hand Hold: 2;Step Height: 4"    Forward Step Up  Left;10 reps;Hand Hold: 1;Step Height: 6"    Step Down  Both;1 set;10 reps;Hand Hold: 1;Step Height: 4"    Functional Squat  2 sets;10 reps    Functional Squat Limitations  with UE support    Gait Training  200 ft with adjusted handle personal QC      Knee/Hip Exercises: Seated   Sit to Sand  1 set;10 reps;with UE support       Knee/Hip Exercises: Supine   Knee Extension  AROM    Knee Extension Limitations  10    Knee Flexion  AROM    Knee Flexion Limitations  110      Knee/Hip Exercises: Prone   Prone Knee Hang  2 minutes    Prone Knee Hang Limitations  STM to hamstrings               PT Short Term Goals - 12/20/19 1028      PT SHORT TERM GOAL #1   Title  Patient will be independent with HEP in order to improve functional outcomes.    Time  3    Period  Weeks    Status  On-going    Target Date  01/08/20      PT SHORT TERM GOAL #2   Title  Patient will report at least 25% improvement in symptoms for improved quality of life.    Time  3    Period  Weeks    Status  On-going    Target Date  01/08/20        PT Long Term Goals - 12/20/19 1028      PT LONG TERM GOAL #1   Title  Patient will report at least 75% improvement in symptoms for improved quality of life.    Time  6    Period  Weeks    Status  On-going      PT LONG TERM GOAL #2   Title  Patient will improve FOTO score by at least 10 points in order to indicate improved tolerance to activity.    Time  6    Period  Weeks    Status  On-going      PT LONG TERM GOAL #3   Title  Patient will be able to ambulate for at least 60 minutes with pain no greater than 1/10 in order to demonstrate improved ability to ambulate for exercise.    Time  6    Period  Weeks    Status  On-going      PT LONG TERM GOAL #4   Title  Patient will be able to complete 5x STS in  under 11.4 seconds in order to reduce the risk of falls.    Time  6    Period  Weeks    Status  On-going            Plan - 01/05/20 1130    Clinical Impression Statement  Added stretches to address ROM restrictions wiht positive results.  Reviewed HEP compliance wiht additonal prone knee hang and sit to stands for knee extension and hip strengthening.  Improved AROM 10-110 degrees.    Personal Factors and Comorbidities  Behavior Pattern;Comorbidity 2;Fitness;Past/Current  Experience;Profession    Comorbidities  diabetes, high blood pressure    Examination-Activity Limitations  Bathing;Bed Mobility;Bend;Carry;Caring for Others;Dressing;Hygiene/Grooming;Lift;Locomotion Level;Sit;Sleep;Squat;Stairs;Stand;Toileting;Transfers    Examination-Participation Restrictions  Church;Cleaning;Community Activity;Driving;Laundry;Volunteer;Yard Work    Stability/Clinical Decision Making  Stable/Uncomplicated    Clinical Decision Making  Low    PT Frequency  3x / week    PT Duration  6 weeks    PT Treatment/Interventions  ADLs/Self Care Home Management;Aquatic Therapy;Electrical Stimulation;Cryotherapy;Biofeedback;Iontophoresis 4mg /ml Dexamethasone;Moist Heat;Traction;Ultrasound;DME Instruction;Contrast Bath;Gait training;Stair training;Functional mobility training;Therapeutic activities;Therapeutic exercise;Balance training;Neuromuscular re-education;Patient/family education;Manual techniques;Manual lymph drainage;Compression bandaging;Scar mobilization;Passive range of motion;Dry needling;Energy conservation;Splinting;Taping;Vasopneumatic Device;Spinal Manipulations;Joint Manipulations    PT Next Visit Plan  continue knee flexion/extension ROM exercises focus on extension > flexion, continue LLE strengthening exercises as able. Increase step height for step down, progress standing balance to faom    PT Home Exercise Plan  12/20/19 quad sets, SAQ, LAQ, heel slides in seated and supine 4/12 squat; prone knee hang, STS, and 50 quad sets daily (10x 5 sets daily).       Patient will benefit from skilled therapeutic intervention in order to improve the following deficits and impairments:  Abnormal gait, Decreased activity tolerance, Decreased balance, Decreased mobility, Decreased knowledge of use of DME, Decreased endurance, Decreased range of motion, Decreased strength, Difficulty walking, Hypomobility, Increased edema, Increased fascial restricitons, Increased muscle spasms, Impaired  flexibility, Improper body mechanics, Pain  Visit Diagnosis: Left knee pain, unspecified chronicity  Other abnormalities of gait and mobility  Muscle weakness (generalized)  Other symptoms and signs involving the musculoskeletal system     Problem List Patient Active Problem List   Diagnosis Date Noted  . OA (osteoarthritis) of knee 12/11/2019  . Osteoarthritis of left knee 12/11/2019  . Lab test positive for detection of COVID-19 virus 08/19/2019  . Hyperlipidemia LDL goal <100 06/20/2018  . Erectile dysfunction 12/16/2016  . Essential hypertension 06/19/2016  . Neuropathy due to type 2 diabetes mellitus (HCC) 06/19/2016  . Diabetes (HCC) 03/20/2015  . Diverticulosis of colon without hemorrhage   . Elevated blood pressure 08/01/2014  . Prostate hypertrophy 06/25/2014  . Esophageal reflux 06/25/2014   08/25/2014, LPTA/CLT; CBIS [phone]  [phone] 01/05/2020, 12:48 PM  [facility] [address] [place], [place], [place] Phone: [phone]   Fax:  [phone]  Name: DAEMYN GARIEPY MRN: Rozanna Box Date of Birth: 1961-02-10

## 2020-01-05 NOTE — Patient Instructions (Signed)
Knee Extension Mobilization: Hang (Prone)    With table supporting thighs. Hold 2 minutes and gradually increase time as tolerated.   Repeat 1 times per day. http://orth.exer.us/722   Copyright  VHI. All rights reserved.   Quad Sets    Squeeze pelvic floor and hold. Tighten top of left thigh. Hold for 10 seconds.  Repeat 10 times. Do at least 5 times a day. Repeat with other leg.  Copyright  VHI. All rights reserved.   Sit to Stand: Phase 1    Sitting, squeeze pelvic floor and hold. Lean trunk forward.   Stand nice and slow without hand held assistance Repeat 10 times. Do 2 times a day.  Copyright  VHI. All rights reserved.

## 2020-01-08 ENCOUNTER — Ambulatory Visit (HOSPITAL_COMMUNITY): Payer: BC Managed Care – PPO | Admitting: Physical Therapy

## 2020-01-08 ENCOUNTER — Other Ambulatory Visit: Payer: Self-pay

## 2020-01-08 ENCOUNTER — Encounter (HOSPITAL_COMMUNITY): Payer: Self-pay | Admitting: Physical Therapy

## 2020-01-08 DIAGNOSIS — R2689 Other abnormalities of gait and mobility: Secondary | ICD-10-CM

## 2020-01-08 DIAGNOSIS — M25562 Pain in left knee: Secondary | ICD-10-CM

## 2020-01-08 DIAGNOSIS — M6281 Muscle weakness (generalized): Secondary | ICD-10-CM | POA: Diagnosis not present

## 2020-01-08 DIAGNOSIS — R29898 Other symptoms and signs involving the musculoskeletal system: Secondary | ICD-10-CM | POA: Diagnosis not present

## 2020-01-08 NOTE — Therapy (Signed)
[place], [place], [address] Phone: [phone]   Fax:  [phone]  Physical Therapy Treatment/Progress note  Patient Details  Name: [name] [name] MRN: [id number] Date of Birth: [birth year] Referring Provider (PT): Gaynelle Arabian MD   Encounter Date: 01/08/2020  Progress Note   Reporting Period 12/18/19 to 01/08/20   See note below for Objective Data and Assessment of Progress/Goals   PT End of Session - 01/08/20 0958    Visit Number  10    Number of Visits  18    Date for PT Re-Evaluation  01/29/20    Authorization Type  BSBS (30 visit limit no auth)    Authorization - Visit Number  10    Authorization - Number of Visits  30    Progress Note Due on Visit  20    PT Start Time  [id number]    PT Stop Time  1030    PT Time Calculation (min)  38 min    Activity Tolerance  Patient tolerated treatment well;No increased pain    Behavior During Therapy  WFL for tasks assessed/performed       Past Medical History:  Diagnosis Date  . Anxiety   . Arthritis   . Diabetes mellitus without complication ([place])   . Diverticulosis    pt unaware  . ED (erectile dysfunction)   . Fatty liver   . GERD (gastroesophageal reflux disease)   . History of COVID-19 07/2019  . History of left inguinal hernia   . History of retinal detachment    Right  . Hyperlipidemia   . Hypertension   . Insomnia   . Low ferritin level   . Numbness and tingling of both feet   . Shortness of breath dyspnea    since having COVID 07/2019 with exertions    Past Surgical History:  Procedure Laterality Date  . CHOLECYSTECTOMY    . COLONOSCOPY N/A 11/30/2014   Procedure: COLONOSCOPY;  Surgeon: Daneil Dolin, MD;  [facility];  Service: Endoscopy;  Laterality: N/A;  7:30 Am  . ESOPHAGEAL DILATION    . HERNIA REPAIR     1981  . RETINAL DETACHMENT SURGERY Right   . TOTAL KNEE ARTHROPLASTY Left 12/11/2019   Procedure: TOTAL KNEE  ARTHROPLASTY;  Surgeon: Gaynelle Arabian, MD;  [facility];  Service: Orthopedics;  Laterality: Left;  66mn  . VASCULAR SURGERY      There were no vitals filed for this visit.  Subjective Assessment - 01/08/20 0956    Subjective  Patient reports he over did it over the weekend with walking a lot. He has been very sore. He tried sleeping in his bed but only able to for a few hours. Patient notes 60% improvement in symptoms. He continues to remain limited with fatigue, pain, standing, gait, stairs, and working.    Patient Stated Goals  To get back to work    Currently in Pain?  Yes    Pain Score  5     Pain Location  Knee         [facility]      Assessment   Medical Diagnosis  L TKA    Referring Provider (PT)  FGaynelle ArabianMD    Onset Date/Surgical Date  12/11/19    Prior Therapy  None      Precautions   Precautions  Knee      Restrictions   Weight Bearing  Restrictions  No      Balance Screen   Has the patient fallen in the past 6 months  No    Has the patient had a decrease in activity level because of a fear of falling?   No    Is the patient reluctant to leave their home because of a fear of falling?   No      Prior Function   Level of Independence  Independent    Vocation  Full time employment    Vocation Requirements  Build Cabinets      Cognition   Overall Cognitive Status  Within Functional Limits for tasks assessed      Observation/Other Assessments   Observations  Ambulates with SPC, limited knee flexion/extension ROM, antalgic gait    Focus on Therapeutic Outcomes (FOTO)   59% limited      AROM   Left Knee Extension  10    Left Knee Flexion  115      Strength   Right Hip Flexion  4/5    Left Hip Flexion  4/5    Right Knee Flexion  4+/5    Right Knee Extension  5/5    Left Knee Flexion  4+/5    Left Knee Extension  4+/5    Right Ankle Dorsiflexion  5/5    Left Ankle Dorsiflexion  5/5      Transfers   Five time sit to  stand comments   15.85 seconds    Comments  weight shift off LLE                   [facility] Adult PT Treatment/Exercise - 01/08/20 0001      Knee/Hip Exercises: Aerobic   Recumbent Bike  5 minutes warmup, rocking initially, progressed to full revolution seat 15      Knee/Hip Exercises: Standing   Lateral Step Up  Left;10 reps;Hand Hold: 2;2 sets;Step Height: 6"    Forward Step Up  Left;10 reps;Hand Hold: 1;Step Height: 6";2 sets    Other Standing Knee Exercises  tandem stance on foam 2x30 seconds each leg posterior             PT Education - 01/08/20 0958    Education Details  Patient educated on positioning at rest, completing HEP, and mechanics of exercise    Person(s) Educated  Patient    Methods  Explanation;Demonstration    Comprehension  Verbalized understanding;Returned demonstration       PT Short Term Goals - 01/08/20 0959      PT SHORT TERM GOAL #1   Title  Patient will be independent with HEP in order to improve functional outcomes.    Time  3    Period  Weeks    Status  Achieved    Target Date  01/08/20      PT SHORT TERM GOAL #2   Title  Patient will report at least 25% improvement in symptoms for improved quality of life.    Baseline  4/19 60% improvement    Time  3    Period  Weeks    Status  Achieved    Target Date  01/08/20        PT Long Term Goals - 01/08/20 1015      PT LONG TERM GOAL #1   Title  Patient will report at least 75% improvement in symptoms for improved quality of life.    Time  6    Period  Weeks  Status  On-going      PT LONG TERM GOAL #2   Title  Patient will improve FOTO score by at least 10 points in order to indicate improved tolerance to activity.    Baseline  4/19 59% limited    Time  6    Period  Weeks    Status  Achieved      PT LONG TERM GOAL #3   Title  Patient will be able to ambulate for at least 60 minutes with pain no greater than 1/10 in order to demonstrate improved ability to ambulate for  exercise.    Time  6    Period  Weeks    Status  On-going      PT LONG TERM GOAL #4   Title  Patient will be able to complete 5x STS in under 11.4 seconds in order to reduce the risk of falls.    Time  6    Period  Weeks    Status  On-going            Plan - 01/08/20 1001    Clinical Impression Statement  Patient has met 2/2 short term goals with improvement in symptoms and ability to complete HEP. He has met 1/4 long term goals with improvement in FOTO score indicating improving function. Patient continues to be limited with knee ROM, strength, gait, pain, endurance, and activity tolerance. He remains most limited with knee extension ROM which is limiting gait and overall activity. Patient demonstrates good improving strength with MMT and functional strength with stair. Patient continues to demonstrate weight shift off LLE when completing transfers secondary to pain and weakness. Next session focus on progressing L knee extension and patient educated frequently on proper positioning to improve L knee extension. Patient will continue to benefit from skilled physical therapy in order to reduce impairment and improve function.    Personal Factors and Comorbidities  Behavior Pattern;Comorbidity 2;Fitness;Past/Current Experience;Profession    Comorbidities  diabetes, high blood pressure    Examination-Activity Limitations  Bathing;Bed Mobility;Bend;Carry;Caring for Others;Dressing;Hygiene/Grooming;Lift;Locomotion Level;Sit;Sleep;Squat;Stairs;Stand;Toileting;Transfers    Examination-Participation Restrictions  Church;Cleaning;Community Activity;Driving;Laundry;Volunteer;Yard Work    Stability/Clinical Decision Making  Stable/Uncomplicated    PT Frequency  3x / week    PT Duration  6 weeks    PT Treatment/Interventions  ADLs/Self Care Home Management;Aquatic Therapy;Electrical Stimulation;Cryotherapy;Biofeedback;Iontophoresis '4mg'$ /ml Dexamethasone;Moist Heat;Traction;Ultrasound;DME  Instruction;Contrast Bath;Gait training;Stair training;Functional mobility training;Therapeutic activities;Therapeutic exercise;Balance training;Neuromuscular re-education;Patient/family education;Manual techniques;Manual lymph drainage;Compression bandaging;Scar mobilization;Passive range of motion;Dry needling;Energy conservation;Splinting;Taping;Vasopneumatic Device;Spinal Manipulations;Joint Manipulations    PT Next Visit Plan  continue knee flexion/extension ROM exercises focus on extension > flexion, continue LLE strengthening exercises as able. Manual for extension    PT Home Exercise Plan  12/20/19 quad sets, SAQ, LAQ, heel slides in seated and supine 4/12 squat; prone knee hang, STS, and 50 quad sets daily (10x 5 sets daily).       Patient will benefit from skilled therapeutic intervention in order to improve the following deficits and impairments:  Abnormal gait, Decreased activity tolerance, Decreased balance, Decreased mobility, Decreased knowledge of use of DME, Decreased endurance, Decreased range of motion, Decreased strength, Difficulty walking, Hypomobility, Increased edema, Increased fascial restricitons, Increased muscle spasms, Impaired flexibility, Improper body mechanics, Pain  Visit Diagnosis: Left knee pain, unspecified chronicity  Other abnormalities of gait and mobility  Muscle weakness (generalized)  Other symptoms and signs involving the musculoskeletal system     Problem List Patient Active Problem List   Diagnosis Date Noted  . OA (osteoarthritis) of knee  12/11/2019  . Osteoarthritis of left knee 12/11/2019  . Lab test positive for detection of COVID-19 virus 08/19/2019  . Hyperlipidemia LDL goal <100 06/20/2018  . Erectile dysfunction 12/16/2016  . Essential hypertension 06/19/2016  . Neuropathy due to type 2 diabetes mellitus ([place]) 06/19/2016  . Diabetes ([place]) 03/20/2015  . Diverticulosis of colon without hemorrhage   . Elevated blood pressure 08/01/2014   . Prostate hypertrophy 06/25/2014  . Esophageal reflux 06/25/2014    10:34 AM, 01/08/20 Mearl Latin PT, DPT Physical Therapist at [facility], [place], [address] Phone: [phone]   Fax:  [phone]  Name: [name] [name] MRN: [id number] Date of Birth: Mar 31, 1961

## 2020-01-10 ENCOUNTER — Encounter (HOSPITAL_COMMUNITY): Payer: Self-pay | Admitting: Physical Therapy

## 2020-01-10 ENCOUNTER — Ambulatory Visit (HOSPITAL_COMMUNITY): Payer: BC Managed Care – PPO | Admitting: Physical Therapy

## 2020-01-10 ENCOUNTER — Other Ambulatory Visit: Payer: Self-pay

## 2020-01-10 DIAGNOSIS — M25562 Pain in left knee: Secondary | ICD-10-CM | POA: Diagnosis not present

## 2020-01-10 DIAGNOSIS — M6281 Muscle weakness (generalized): Secondary | ICD-10-CM | POA: Diagnosis not present

## 2020-01-10 DIAGNOSIS — R29898 Other symptoms and signs involving the musculoskeletal system: Secondary | ICD-10-CM | POA: Diagnosis not present

## 2020-01-10 DIAGNOSIS — R2689 Other abnormalities of gait and mobility: Secondary | ICD-10-CM | POA: Diagnosis not present

## 2020-01-10 NOTE — Therapy (Signed)
[facility] [address] [place], [place], [address] Phone: [phone]   Fax:  [phone]  Physical Therapy Treatment  Patient Details  Name: [name] [name] MRN: [id number] Date of Birth: Sep 15, 1961 Referring Provider (PT): Ollen Gross MD   Encounter Date: 01/10/2020  PT End of Session - 01/10/20 0952    Visit Number  11    Number of Visits  18    Date for PT Re-Evaluation  01/29/20    Authorization Type  BSBS (30 visit limit no auth)    Authorization - Visit Number  10    Authorization - Number of Visits  30    Progress Note Due on Visit  20    PT Start Time  0947    PT Stop Time  1028    PT Time Calculation (min)  41 min    Activity Tolerance  Patient tolerated treatment well;No increased pain    Behavior During Therapy  WFL for tasks assessed/performed       Past Medical History:  Diagnosis Date  . Anxiety   . Arthritis   . Diabetes mellitus without complication (HCC)   . Diverticulosis    pt unaware  . ED (erectile dysfunction)   . Fatty liver   . GERD (gastroesophageal reflux disease)   . History of COVID-19 07/2019  . History of left inguinal hernia   . History of retinal detachment    Right  . Hyperlipidemia   . Hypertension   . Insomnia   . Low ferritin level   . Numbness and tingling of both feet   . Shortness of breath dyspnea    since having COVID 07/2019 with exertions    Past Surgical History:  Procedure Laterality Date  . CHOLECYSTECTOMY    . COLONOSCOPY N/A 11/30/2014   Procedure: COLONOSCOPY;  Surgeon: Corbin Ade, MD;  [facility];  Service: Endoscopy;  Laterality: N/A;  7:30 Am  . ESOPHAGEAL DILATION    . HERNIA REPAIR     1981  . RETINAL DETACHMENT SURGERY Right   . TOTAL KNEE ARTHROPLASTY Left 12/11/2019   Procedure: TOTAL KNEE ARTHROPLASTY;  Surgeon: Ollen Gross, MD;  Location: [facility];  Service: Orthopedics;  Laterality: Left;   . VASCULAR SURGERY      There  were no vitals filed for this visit.  Subjective Assessment - 01/10/20 0945    Subjective  Patient states he has increase pain at night but is able to move around a lot during the day. Everything seems to be going well. He is trying to not take too much pain meds but he feels like he may not be taking enough because pain gets so bad.    Patient Stated Goals  To get back to work    Currently in Pain?  Yes    Pain Score  5     Pain Location  Knee                       [facility] Adult PT Treatment/Exercise - 01/10/20 0001      Ambulation/Gait   Ambulation/Gait  Yes    Ambulation/Gait Assistance  6: Modified independent (Device/Increase time)    Ambulation Distance (Feet)  200 Feet    Assistive device  Straight cane    Gait Pattern  Antalgic;Left flexed knee in stance;Decreased hip/knee flexion - left;Decreased stance time - left    Gait Comments  with verbal cueing for heel  toe gait       Knee/Hip Exercises: Stretches   Gastroc Stretch  3 reps;30 seconds    Gastroc Stretch Limitations  Slant board      Knee/Hip Exercises: Aerobic   Recumbent Bike  5 minutes warmup, rocking initially, progressed to full revolution seat 15      Knee/Hip Exercises: Standing   Lateral Step Up  Left;10 reps;Hand Hold: 2;2 sets;Step Height: 6"    Forward Step Up  Left;10 reps;Hand Hold: 1;Step Height: 6";2 sets    Functional Squat  2 sets;10 reps    Functional Squat Limitations  with UE support    Other Standing Knee Exercises  20 feet heel walking, 20 feet toe walking      Knee/Hip Exercises: Supine   Quad Sets  10 reps    Quad Sets Limitations  10 second holds    Knee Extension  AROM    Knee Extension Limitations  10    Knee Flexion  AROM    Knee Flexion Limitations  115      Manual Therapy   Manual Therapy  Soft tissue mobilization;Joint mobilization    Manual therapy comments  completed independently from all other aspects of treatment    Joint Mobilization  overpressure for  extensions with heel prop and extension with tibial ER with AP grade II-III    Soft tissue mobilization  L quad and hamstring stm             PT Education - 01/10/20 0951    Education Details  Patient educated on positioning at rest, completing HEP, and mechanics of exercise    Person(s) Educated  Patient    Methods  Explanation;Demonstration    Comprehension  Verbalized understanding;Returned demonstration       PT Short Term Goals - 01/08/20 0959      PT SHORT TERM GOAL #1   Title  Patient will be independent with HEP in order to improve functional outcomes.    Time  3    Period  Weeks    Status  Achieved    Target Date  01/08/20      PT SHORT TERM GOAL #2   Title  Patient will report at least 25% improvement in symptoms for improved quality of life.    Baseline  4/19 60% improvement    Time  3    Period  Weeks    Status  Achieved    Target Date  01/08/20        PT Long Term Goals - 01/08/20 1015      PT LONG TERM GOAL #1   Title  Patient will report at least 75% improvement in symptoms for improved quality of life.    Time  6    Period  Weeks    Status  On-going      PT LONG TERM GOAL #2   Title  Patient will improve FOTO score by at least 10 points in order to indicate improved tolerance to activity.    Baseline  4/19 59% limited    Time  6    Period  Weeks    Status  Achieved      PT LONG TERM GOAL #3   Title  Patient will be able to ambulate for at least 60 minutes with pain no greater than 1/10 in order to demonstrate improved ability to ambulate for exercise.    Time  6    Period  Weeks    Status  On-going  PT LONG TERM GOAL #4   Title  Patient will be able to complete 5x STS in under 11.4 seconds in order to reduce the risk of falls.    Time  6    Period  Weeks    Status  On-going            Plan - 01/10/20 1018    Clinical Impression Statement  Patient continues to improve in knee flexion and lacks about 10 degrees of extension.  He does not show improvement in extension ROM following manual therapy today. He continues to c/o hamstring stretch and anterior knee pain with trying to improve extension ROM. He lacks TKE with gait and continues to ambulate with flat foot at initial contact despite cueing for heel toe gait pattern. He demonstrates improving functional strength but lacks eccentric strength and motor control. Patient will continue to benefit from skilled physical therapy in order to improve function and reduce impairment.    Personal Factors and Comorbidities  Behavior Pattern;Comorbidity 2;Fitness;Past/Current Experience;Profession    Comorbidities  diabetes, high blood pressure    Examination-Activity Limitations  Bathing;Bed Mobility;Bend;Carry;Caring for Others;Dressing;Hygiene/Grooming;Lift;Locomotion Level;Sit;Sleep;Squat;Stairs;Stand;Toileting;Transfers    Examination-Participation Restrictions  Church;Cleaning;Community Activity;Driving;Laundry;Volunteer;Yard Work    Stability/Clinical Decision Making  Stable/Uncomplicated    PT Frequency  3x / week    PT Duration  6 weeks    PT Treatment/Interventions  ADLs/Self Care Home Management;Aquatic Therapy;Electrical Stimulation;Cryotherapy;Biofeedback;Iontophoresis 4mg /ml Dexamethasone;Moist Heat;Traction;Ultrasound;DME Instruction;Contrast Bath;Gait training;Stair training;Functional mobility training;Therapeutic activities;Therapeutic exercise;Balance training;Neuromuscular re-education;Patient/family education;Manual techniques;Manual lymph drainage;Compression bandaging;Scar mobilization;Passive range of motion;Dry needling;Energy conservation;Splinting;Taping;Vasopneumatic Device;Spinal Manipulations;Joint Manipulations    PT Next Visit Plan  continue knee flexion/extension ROM exercises focus on extension > flexion, continue LLE strengthening exercises as able. Manual for extension    PT Home Exercise Plan  12/20/19 quad sets, SAQ, LAQ, heel slides in seated and  supine 4/12 squat; prone knee hang, STS, and 50 quad sets daily (10x 5 sets daily).       Patient will benefit from skilled therapeutic intervention in order to improve the following deficits and impairments:  Abnormal gait, Decreased activity tolerance, Decreased balance, Decreased mobility, Decreased knowledge of use of DME, Decreased endurance, Decreased range of motion, Decreased strength, Difficulty walking, Hypomobility, Increased edema, Increased fascial restricitons, Increased muscle spasms, Impaired flexibility, Improper body mechanics, Pain  Visit Diagnosis: Left knee pain, unspecified chronicity  Other abnormalities of gait and mobility  Muscle weakness (generalized)  Other symptoms and signs involving the musculoskeletal system     Problem List Patient Active Problem List   Diagnosis Date Noted  . OA (osteoarthritis) of knee 12/11/2019  . Osteoarthritis of left knee 12/11/2019  . Lab test positive for detection of COVID-19 virus 08/19/2019  . Hyperlipidemia LDL goal <100 06/20/2018  . Erectile dysfunction 12/16/2016  . Essential hypertension 06/19/2016  . Neuropathy due to type 2 diabetes mellitus ([place]) 06/19/2016  . Diabetes ([place]) 03/20/2015  . Diverticulosis of colon without hemorrhage   . Elevated blood pressure 08/01/2014  . Prostate hypertrophy 06/25/2014  . Esophageal reflux 06/25/2014    10:30 AM, 01/10/20 Mearl Latin PT, DPT Physical Therapist at [facility] [place], [place], [address] Phone: [phone]   Fax:  [phone]  Name: [name] [name] MRN: [id number] Date of Birth: 14-Mar-1961

## 2020-01-12 ENCOUNTER — Other Ambulatory Visit: Payer: Self-pay

## 2020-01-12 ENCOUNTER — Encounter (HOSPITAL_COMMUNITY): Payer: Self-pay

## 2020-01-12 ENCOUNTER — Ambulatory Visit (HOSPITAL_COMMUNITY): Payer: BC Managed Care – PPO

## 2020-01-12 DIAGNOSIS — R29898 Other symptoms and signs involving the musculoskeletal system: Secondary | ICD-10-CM

## 2020-01-12 DIAGNOSIS — R2689 Other abnormalities of gait and mobility: Secondary | ICD-10-CM | POA: Diagnosis not present

## 2020-01-12 DIAGNOSIS — M25562 Pain in left knee: Secondary | ICD-10-CM

## 2020-01-12 DIAGNOSIS — M6281 Muscle weakness (generalized): Secondary | ICD-10-CM | POA: Diagnosis not present

## 2020-01-12 NOTE — Therapy (Signed)
[facility] [address] [place], [place], [address] Phone: [phone]   Fax:  [phone]  Physical Therapy Treatment  Patient Details  Name: [name] [name] MRN: [id number] Date of Birth: 1960/10/09 Referring Provider (PT): Ollen Gross MD   Encounter Date: 01/12/2020  PT End of Session - 01/12/20 1052    Visit Number  12    Number of Visits  18    Date for PT Re-Evaluation  01/29/20    Authorization Type  BSBS (30 visit limit no auth)    Authorization - Visit Number  12    Authorization - Number of Visits  30    Progress Note Due on Visit  20    PT Start Time  1045   3' on bike, not included wiht charges   PT Stop Time  1128    PT Time Calculation (min)  43 min    Activity Tolerance  Patient tolerated treatment well;No increased pain    Behavior During Therapy  WFL for tasks assessed/performed       Past Medical History:  Diagnosis Date  . Anxiety   . Arthritis   . Diabetes mellitus without complication (HCC)   . Diverticulosis    pt unaware  . ED (erectile dysfunction)   . Fatty liver   . GERD (gastroesophageal reflux disease)   . History of COVID-19 07/2019  . History of left inguinal hernia   . History of retinal detachment    Right  . Hyperlipidemia   . Hypertension   . Insomnia   . Low ferritin level   . Numbness and tingling of both feet   . Shortness of breath dyspnea    since having COVID 07/2019 with exertions    Past Surgical History:  Procedure Laterality Date  . CHOLECYSTECTOMY    . COLONOSCOPY N/A 11/30/2014   Procedure: COLONOSCOPY;  Surgeon: Corbin Ade, MD;  [facility];  Service: Endoscopy;  Laterality: N/A;  7:30 Am  . ESOPHAGEAL DILATION    . HERNIA REPAIR     1981  . RETINAL DETACHMENT SURGERY Right   . TOTAL KNEE ARTHROPLASTY Left 12/11/2019   Procedure: TOTAL KNEE ARTHROPLASTY;  Surgeon: Ollen Gross, MD;  Location[facility];  Service: Orthopedics;  Laterality: Left;    . VASCULAR SURGERY      There were no vitals filed for this visit.  Subjective Assessment - 01/12/20 1051    Subjective  Pt stated he had a restless night due to knee pain, meds didnt seem to help, did find relief with ice.  Stated it feels better now, current pain scale 3-4/10.    Patient Stated Goals  To get back to work    Currently in Pain?  Yes    Pain Score  4     Pain Location  Knee    Pain Orientation  Left    Pain Descriptors / Indicators  Aching;Tightness;Sore    Pain Type  Surgical pain                       [facility] Adult PT Treatment/Exercise - 01/12/20 0001      Ambulation/Gait   Ambulation/Gait  Yes    Ambulation/Gait Assistance  6: Modified independent (Device/Increase time)    Ambulation Distance (Feet)  200 Feet    Assistive device  Straight cane    Gait Pattern  Antalgic;Left flexed knee in stance;Decreased hip/knee flexion - left;Decreased stance time -  left    Gait Comments  with verbal cueing for heel toe gait       Knee/Hip Exercises: Stretches   Active Hamstring Stretch  Left;3 reps;30 seconds    Active Hamstring Stretch Limitations  12in step height     Gastroc Stretch  3 reps;30 seconds    Gastroc Stretch Limitations  Slant board      Knee/Hip Exercises: Aerobic   Recumbent Bike  3' seat 15 full revolution      Knee/Hip Exercises: Standing   Heel Raises Limitations  toe raise 20x on slant    Terminal Knee Extension  Left;10 reps;Theraband    Theraband Level (Terminal Knee Extension)  Other (comment)    Terminal Knee Extension Limitations  Purple toe to heel retro gait      Knee/Hip Exercises: Supine   Terminal Knee Extension  2 sets;10 reps;Limitations    Terminal Knee Extension Limitations  5" holds    Knee Extension  AROM    Knee Extension Limitations  8    Knee Flexion  AROM    Knee Flexion Limitations  115      Knee/Hip Exercises: Prone   Prone Knee Hang  3 minutes    Prone Knee Hang Limitations  STM to hamstrings     Other Prone Exercises  TKE 10x 5"               PT Short Term Goals - 01/08/20 0959      PT SHORT TERM GOAL #1   Title  Patient will be independent with HEP in order to improve functional outcomes.    Time  3    Period  Weeks    Status  Achieved    Target Date  01/08/20      PT SHORT TERM GOAL #2   Title  Patient will report at least 25% improvement in symptoms for improved quality of life.    Baseline  4/19 60% improvement    Time  3    Period  Weeks    Status  Achieved    Target Date  01/08/20        PT Long Term Goals - 01/08/20 1015      PT LONG TERM GOAL #1   Title  Patient will report at least 75% improvement in symptoms for improved quality of life.    Time  6    Period  Weeks    Status  On-going      PT LONG TERM GOAL #2   Title  Patient will improve FOTO score by at least 10 points in order to indicate improved tolerance to activity.    Baseline  4/19 59% limited    Time  6    Period  Weeks    Status  Achieved      PT LONG TERM GOAL #3   Title  Patient will be able to ambulate for at least 60 minutes with pain no greater than 1/10 in order to demonstrate improved ability to ambulate for exercise.    Time  6    Period  Weeks    Status  On-going      PT LONG TERM GOAL #4   Title  Patient will be able to complete 5x STS in under 11.4 seconds in order to reduce the risk of falls.    Time  6    Period  Weeks    Status  On-going  Plan - 01/12/20 1116    Clinical Impression Statement  Added TKE exercises to address extension lag.  Pt tolerated well with new exercises with no reports of increased pain, was limited by visual musculature fatigue with new exercises.    Personal Factors and Comorbidities  Behavior Pattern;Comorbidity 2;Fitness;Past/Current Experience;Profession    Comorbidities  diabetes, high blood pressure    Examination-Activity Limitations  Bathing;Bed Mobility;Bend;Carry;Caring for  Others;Dressing;Hygiene/Grooming;Lift;Locomotion Level;Sit;Sleep;Squat;Stairs;Stand;Toileting;Transfers    PT Next Visit Plan  Progress note prior MD apt 01/16/20. continue knee flexion/extension ROM exercises focus on extension > flexion, continue LLE strengthening exercises as able. Manual for extension    PT Home Exercise Plan  12/20/19 quad sets, SAQ, LAQ, heel slides in seated and supine 4/12 squat; prone knee hang, STS, and 50 quad sets daily (10x 5 sets daily).       Patient will benefit from skilled therapeutic intervention in order to improve the following deficits and impairments:     Visit Diagnosis: Left knee pain, unspecified chronicity  Other abnormalities of gait and mobility  Muscle weakness (generalized)  Other symptoms and signs involving the musculoskeletal system     Problem List Patient Active Problem List   Diagnosis Date Noted  . OA (osteoarthritis) of knee 12/11/2019  . Osteoarthritis of left knee 12/11/2019  . Lab test positive for detection of COVID-19 virus 08/19/2019  . Hyperlipidemia LDL goal <100 06/20/2018  . Erectile dysfunction 12/16/2016  . Essential hypertension 06/19/2016  . Neuropathy due to type 2 diabetes mellitus (HCC) 06/19/2016  . Diabetes (HCC) 03/20/2015  . Diverticulosis of colon without hemorrhage   . Elevated blood pressure 08/01/2014  . Prostate hypertrophy 06/25/2014  . Esophageal reflux 06/25/2014   Becky Sax, LPTA/CLT; CBIS [phone]  Juel Burrow 01/12/2020, 1:00 PM  [facility] [address] [place], [place], [address] Phone: [phone]   Fax:  [phone]  Name: ATTIKUS BARTOSZEK MRN: [id number] Date of Birth: May 20, 1961

## 2020-01-14 ENCOUNTER — Other Ambulatory Visit: Payer: Self-pay | Admitting: Family Medicine

## 2020-01-15 ENCOUNTER — Ambulatory Visit (HOSPITAL_COMMUNITY): Payer: BC Managed Care – PPO | Admitting: Physical Therapy

## 2020-01-15 ENCOUNTER — Encounter (HOSPITAL_COMMUNITY): Payer: Self-pay | Admitting: Physical Therapy

## 2020-01-15 ENCOUNTER — Other Ambulatory Visit: Payer: Self-pay

## 2020-01-15 DIAGNOSIS — R29898 Other symptoms and signs involving the musculoskeletal system: Secondary | ICD-10-CM | POA: Diagnosis not present

## 2020-01-15 DIAGNOSIS — M25562 Pain in left knee: Secondary | ICD-10-CM

## 2020-01-15 DIAGNOSIS — R2689 Other abnormalities of gait and mobility: Secondary | ICD-10-CM

## 2020-01-15 DIAGNOSIS — M6281 Muscle weakness (generalized): Secondary | ICD-10-CM

## 2020-01-15 NOTE — Therapy (Signed)
[facility] [place], [place], [address] Phone: [phone]   Fax:  [phone]  Physical Therapy Treatment  Patient Details  Name: [name] [name] MRN: [id number] Date of Birth: 1961-01-13 Referring Provider (PT): Gaynelle Arabian MD   Encounter Date: 01/15/2020  PT End of Session - 01/15/20 0954    Visit Number  13    Number of Visits  18    Date for PT Re-Evaluation  01/29/20    Authorization Type  BSBS (30 visit limit no auth)    Authorization - Visit Number  12    Authorization - Number of Visits  30    Progress Note Due on Visit  20    PT Start Time  0950    PT Stop Time  1030    PT Time Calculation (min)  40 min    Activity Tolerance  Patient tolerated treatment well;No increased pain    Behavior During Therapy  WFL for tasks assessed/performed       Past Medical History:  Diagnosis Date  . Anxiety   . Arthritis   . Diabetes mellitus without complication ([place])   . Diverticulosis    pt unaware  . ED (erectile dysfunction)   . Fatty liver   . GERD (gastroesophageal reflux disease)   . History of COVID-19 07/2019  . History of left inguinal hernia   . History of retinal detachment    Right  . Hyperlipidemia   . Hypertension   . Insomnia   . Low ferritin level   . Numbness and tingling of both feet   . Shortness of breath dyspnea    since having COVID 07/2019 with exertions    Past Surgical History:  Procedure Laterality Date  . CHOLECYSTECTOMY    . COLONOSCOPY N/A 11/30/2014   Procedure: COLONOSCOPY;  Surgeon: Daneil Dolin, MD;  [facility];  Service: Endoscopy;  Laterality: N/A;  7:30 Am  . ESOPHAGEAL DILATION    . HERNIA REPAIR     1981  . RETINAL DETACHMENT SURGERY Right   . TOTAL KNEE ARTHROPLASTY Left 12/11/2019   Procedure: TOTAL KNEE ARTHROPLASTY;  Surgeon: Gaynelle Arabian, MD;  Location: [facility];  Service: Orthopedics;  Laterality: Left;  39min  . VASCULAR SURGERY      There  were no vitals filed for this visit.  Subjective Assessment - 01/15/20 0949    Subjective  Patient states he is still having pain at night. He continues to have difficulty sleeping at night and is unable to get comfortable in bed.    Patient Stated Goals  To get back to work    Currently in Pain?  Yes    Pain Score  5     Pain Location  Knee    Pain Orientation  Left                       [facility] Adult PT Treatment/Exercise - 01/15/20 0001      Knee/Hip Exercises: Stretches   Active Hamstring Stretch  Left;3 reps;30 seconds    Active Hamstring Stretch Limitations  12in step height     Gastroc Stretch  3 reps;30 seconds    Gastroc Stretch Limitations  Slant board      Knee/Hip Exercises: Standing   Other Standing Knee Exercises  retro walking with resistance 2x10 50#      Knee/Hip Exercises: Supine   Terminal Knee Extension  2 sets;10 reps;Limitations  Terminal Knee Extension Limitations  5" holds    Knee Extension  AROM    Knee Extension Limitations  10    Knee Flexion  AROM    Knee Flexion Limitations  117      Manual Therapy   Manual Therapy  Soft tissue mobilization;Joint mobilization;Edema management    Manual therapy comments  completed independently from all other aspects of treatment    Edema Management  edema massage of L knee for decreased swelling    Joint Mobilization  overpressure for extensions with heel prop and extension with tibial ER with AP grade II-III    Soft tissue mobilization  L quad and hamstring stm, scar mobilization             PT Education - 01/15/20 0953    Education Details  Patient educated on positioning at rest, completing HEP, and mechanics of exercise    Person(s) Educated  Patient    Methods  Explanation;Demonstration    Comprehension  Verbalized understanding;Returned demonstration       PT Short Term Goals - 01/08/20 0959      PT SHORT TERM GOAL #1   Title  Patient will be independent with HEP in order to  improve functional outcomes.    Time  3    Period  Weeks    Status  Achieved    Target Date  01/08/20      PT SHORT TERM GOAL #2   Title  Patient will report at least 25% improvement in symptoms for improved quality of life.    Baseline  4/19 60% improvement    Time  3    Period  Weeks    Status  Achieved    Target Date  01/08/20        PT Long Term Goals - 01/08/20 1015      PT LONG TERM GOAL #1   Title  Patient will report at least 75% improvement in symptoms for improved quality of life.    Time  6    Period  Weeks    Status  On-going      PT LONG TERM GOAL #2   Title  Patient will improve FOTO score by at least 10 points in order to indicate improved tolerance to activity.    Baseline  4/19 59% limited    Time  6    Period  Weeks    Status  Achieved      PT LONG TERM GOAL #3   Title  Patient will be able to ambulate for at least 60 minutes with pain no greater than 1/10 in order to demonstrate improved ability to ambulate for exercise.    Time  6    Period  Weeks    Status  On-going      PT LONG TERM GOAL #4   Title  Patient will be able to complete 5x STS in under 11.4 seconds in order to reduce the risk of falls.    Time  6    Period  Weeks    Status  On-going            Plan - 01/15/20 0955    Clinical Impression Statement  Patient continues to improve knee flexion ROM but remains limited with TKE on L knee lacking about 10 degrees. He tolerates manual therapy well with minimal increase in ROM following even though he is able to achieve increased knee extension with overpressure. His distal scar becomes more mobile following  mobilizations. He demonstrates improving motor control with TKE exercise but lacks end range motion. Patient ambulates without cane today and continues to lack heel strike and lands with flat foot at initial contact due to lack in extension despite cueing for heel toe gait. He requires frequent verbal cueing for slow and controlled  movements with retro gait with resistance. Patient will continue to benefit from skilled physical therapy in order to reduce impairment and improve function.    Personal Factors and Comorbidities  Behavior Pattern;Comorbidity 2;Fitness;Past/Current Experience;Profession    Comorbidities  diabetes, high blood pressure    Examination-Activity Limitations  Bathing;Bed Mobility;Bend;Carry;Caring for Others;Dressing;Hygiene/Grooming;Lift;Locomotion Level;Sit;Sleep;Squat;Stairs;Stand;Toileting;Transfers    Examination-Participation Restrictions  Church;Cleaning;Community Activity;Driving;Laundry;Volunteer;Yard Work    Rehab Potential  Good    PT Frequency  3x / week    PT Duration  6 weeks    PT Treatment/Interventions  ADLs/Self Care Home Management;Aquatic Therapy;Electrical Stimulation;Cryotherapy;Biofeedback;Iontophoresis 4mg /ml Dexamethasone;Moist Heat;Traction;Ultrasound;DME Instruction;Contrast Bath;Gait training;Stair training;Functional mobility training;Therapeutic activities;Therapeutic exercise;Balance training;Neuromuscular re-education;Patient/family education;Manual techniques;Manual lymph drainage;Compression bandaging;Scar mobilization;Passive range of motion;Dry needling;Energy conservation;Splinting;Taping;Vasopneumatic Device;Spinal Manipulations;Joint Manipulations    PT Next Visit Plan  continue knee flexion/extension ROM exercises focus on extension > flexion, continue LLE strengthening exercises as able. Manual for extension    PT Home Exercise Plan  12/20/19 quad sets, SAQ, LAQ, heel slides in seated and supine 4/12 squat; prone knee hang, STS, and 50 quad sets daily (10x 5 sets daily).       Patient will benefit from skilled therapeutic intervention in order to improve the following deficits and impairments:  Abnormal gait, Decreased activity tolerance, Decreased balance, Decreased mobility, Decreased knowledge of use of DME, Decreased endurance, Decreased range of motion, Decreased  strength, Difficulty walking, Hypomobility, Increased edema, Increased fascial restricitons, Increased muscle spasms, Impaired flexibility, Improper body mechanics, Pain  Visit Diagnosis: Left knee pain, unspecified chronicity  Other abnormalities of gait and mobility  Muscle weakness (generalized)  Other symptoms and signs involving the musculoskeletal system     Problem List Patient Active Problem List   Diagnosis Date Noted  . OA (osteoarthritis) of knee 12/11/2019  . Osteoarthritis of left knee 12/11/2019  . Lab test positive for detection of COVID-19 virus 08/19/2019  . Hyperlipidemia LDL goal <100 06/20/2018  . Erectile dysfunction 12/16/2016  . Essential hypertension 06/19/2016  . Neuropathy due to type 2 diabetes mellitus (HCC) 06/19/2016  . Diabetes (HCC) 03/20/2015  . Diverticulosis of colon without hemorrhage   . Elevated blood pressure 08/01/2014  . Prostate hypertrophy 06/25/2014  . Esophageal reflux 06/25/2014    10:30 AM, 01/15/20 01/17/20 PT, DPT Physical Therapist at [facility]    [facility] [address] [place], [place], [place] Phone: [phone]   Fax:  [phone]  Name: [name] [name] MRN: Rozanna [id number] Date of Birth: 30-Apr-1961

## 2020-01-16 DIAGNOSIS — Z471 Aftercare following joint replacement surgery: Secondary | ICD-10-CM | POA: Diagnosis not present

## 2020-01-16 DIAGNOSIS — Z96652 Presence of left artificial knee joint: Secondary | ICD-10-CM | POA: Diagnosis not present

## 2020-01-17 ENCOUNTER — Ambulatory Visit (HOSPITAL_COMMUNITY): Payer: BC Managed Care – PPO | Admitting: Physical Therapy

## 2020-01-17 ENCOUNTER — Other Ambulatory Visit: Payer: Self-pay

## 2020-01-17 ENCOUNTER — Encounter (HOSPITAL_COMMUNITY): Payer: Self-pay | Admitting: Physical Therapy

## 2020-01-17 DIAGNOSIS — M25562 Pain in left knee: Secondary | ICD-10-CM | POA: Diagnosis not present

## 2020-01-17 DIAGNOSIS — M6281 Muscle weakness (generalized): Secondary | ICD-10-CM | POA: Diagnosis not present

## 2020-01-17 DIAGNOSIS — R2689 Other abnormalities of gait and mobility: Secondary | ICD-10-CM | POA: Diagnosis not present

## 2020-01-17 DIAGNOSIS — R29898 Other symptoms and signs involving the musculoskeletal system: Secondary | ICD-10-CM

## 2020-01-17 NOTE — Therapy (Signed)
[facility] [address] [place], [place], [address] Phone: [phone]   Fax:  [phone]  Physical Therapy Treatment  Patient Details  Name: [name] [name] MRN: [id number] Date of Birth: 06/03/1961 Referring Provider (PT): Ollen Gross MD   Encounter Date: 01/17/2020  PT End of Session - 01/17/20 1017    Visit Number  14    Number of Visits  18    Date for PT Re-Evaluation  01/29/20    Authorization Type  BSBS (30 visit limit no auth)    Authorization - Visit Number  14    Authorization - Number of Visits  30    Progress Note Due on Visit  20    PT Start Time  0947    PT Stop Time  1025    PT Time Calculation (min)  38 min    Activity Tolerance  Patient tolerated treatment well;No increased pain    Behavior During Therapy  WFL for tasks assessed/performed       Past Medical History:  Diagnosis Date  . Anxiety   . Arthritis   . Diabetes mellitus without complication ([facility])   . Diverticulosis    pt unaware  . ED (erectile dysfunction)   . Fatty liver   . GERD (gastroesophageal reflux disease)   . History of COVID-19 07/2019  . History of left inguinal hernia   . History of retinal detachment    Right  . Hyperlipidemia   . Hypertension   . Insomnia   . Low ferritin level   . Numbness and tingling of both feet   . Shortness of breath dyspnea    since having COVID 07/2019 with exertions    Past Surgical History:  Procedure Laterality Date  . CHOLECYSTECTOMY    . COLONOSCOPY N/A 11/30/2014   Procedure: COLONOSCOPY;  Surgeon: Corbin Ade, MD;  [facility];  Service: Endoscopy;  Laterality: N/A;  7:30 Am  . ESOPHAGEAL DILATION    . HERNIA REPAIR     1981  . RETINAL DETACHMENT SURGERY Right   . TOTAL KNEE ARTHROPLASTY Left 12/11/2019   Procedure: TOTAL KNEE ARTHROPLASTY;  Surgeon: Ollen Gross, MD;  Location[facility];  Service: Orthopedics;  Laterality: Left;   . VASCULAR SURGERY      There  were no vitals filed for this visit.  Subjective Assessment - 01/17/20 0948    Subjective  Patient states his MD says he is bending good and his straightening should be pretty good. He continues to have pain, mostly at night.    Patient Stated Goals  To get back to work    Currently in Pain?  Yes    Pain Score  4                        [facility] Adult PT Treatment/Exercise - 01/17/20 0001      Knee/Hip Exercises: Standing   Heel Raises  15 reps;5 seconds    Terminal Knee Extension  Left;10 reps;Theraband    Theraband Level (Terminal Knee Extension)  Other (comment)    Terminal Knee Extension Limitations  tke with quad set 10 second holds    Lateral Step Up  Left;10 reps;Hand Hold: 2;2 sets;Step Height: 6"    Lateral Step Up Limitations  eccentric control     Forward Step Up  Left;10 reps;Hand Hold: 1;Step Height: 6";2 sets    Step Down  10 reps;Hand Hold: 1;Step Height: 6";Left;2  sets    Functional Squat  2 sets;10 reps    Functional Squat Limitations  on slant board with UE support    SLS with Vectors  2x 5 with 5 second holds    Other Standing Knee Exercises  retro walking with resistance 2x10 50#    Other Standing Knee Exercises  Reciprocal stairs 4 RT      Knee/Hip Exercises: Supine   Knee Extension  AROM    Knee Extension Limitations  10    Knee Flexion  AROM    Knee Flexion Limitations  117             PT Education - 01/17/20 1016    Education Details  Patient educated on HEP, mechanics of exercise, and benefits of continuing physical therapy to improve strength and mobility    Person(s) Educated  Patient    Methods  Explanation;Demonstration    Comprehension  Verbalized understanding;Returned demonstration       PT Short Term Goals - 01/08/20 0959      PT SHORT TERM GOAL #1   Title  Patient will be independent with HEP in order to improve functional outcomes.    Time  3    Period  Weeks    Status  Achieved    Target Date  01/08/20      PT  SHORT TERM GOAL #2   Title  Patient will report at least 25% improvement in symptoms for improved quality of life.    Baseline  4/19 60% improvement    Time  3    Period  Weeks    Status  Achieved    Target Date  01/08/20        PT Long Term Goals - 01/08/20 1015      PT LONG TERM GOAL #1   Title  Patient will report at least 75% improvement in symptoms for improved quality of life.    Time  6    Period  Weeks    Status  On-going      PT LONG TERM GOAL #2   Title  Patient will improve FOTO score by at least 10 points in order to indicate improved tolerance to activity.    Baseline  4/19 59% limited    Time  6    Period  Weeks    Status  Achieved      PT LONG TERM GOAL #3   Title  Patient will be able to ambulate for at least 60 minutes with pain no greater than 1/10 in order to demonstrate improved ability to ambulate for exercise.    Time  6    Period  Weeks    Status  On-going      PT LONG TERM GOAL #4   Title  Patient will be able to complete 5x STS in under 11.4 seconds in order to reduce the risk of falls.    Time  6    Period  Weeks    Status  On-going            Plan - 01/17/20 1018    Clinical Impression Statement  Patient continues to lack TKE ROM which causes impaired gait. Patient shows improving LE strength with stair exercises and improving eccentric motor control. He requires frequent verbal cueing for positioning and mechanics of exercise especially to limit UE use secondary to LE weakness. He demonstrates weight shift off LLE when completing squat on slant board secondary to L quad weakness. Patient eager  to be done with therapy but patient educated on his impaired ROM and strength and why he would benefit from more physical therapy. Patient will continue to benefit from skilled physical therapy in order to reduce impairment and improve function.    Personal Factors and Comorbidities  Behavior Pattern;Comorbidity 2;Fitness;Past/Current  Experience;Profession    Comorbidities  diabetes, high blood pressure    Examination-Activity Limitations  Bathing;Bed Mobility;Bend;Carry;Caring for Others;Dressing;Hygiene/Grooming;Lift;Locomotion Level;Sit;Sleep;Squat;Stairs;Stand;Toileting;Transfers    Examination-Participation Restrictions  Church;Cleaning;Community Activity;Driving;Laundry;Volunteer;Yard Work    Rehab Potential  Good    PT Frequency  3x / week    PT Duration  6 weeks    PT Treatment/Interventions  ADLs/Self Care Home Management;Aquatic Therapy;Electrical Stimulation;Cryotherapy;Biofeedback;Iontophoresis 4mg /ml Dexamethasone;Moist Heat;Traction;Ultrasound;DME Instruction;Contrast Bath;Gait training;Stair training;Functional mobility training;Therapeutic activities;Therapeutic exercise;Balance training;Neuromuscular re-education;Patient/family education;Manual techniques;Manual lymph drainage;Compression bandaging;Scar mobilization;Passive range of motion;Dry needling;Energy conservation;Splinting;Taping;Vasopneumatic Device;Spinal Manipulations;Joint Manipulations    PT Next Visit Plan  continue knee flexion/extension ROM exercises focus on extension > flexion, continue LLE strengthening exercises as able. Manual for extension    PT Home Exercise Plan  12/20/19 quad sets, SAQ, LAQ, heel slides in seated and supine 4/12 squat; prone knee hang, STS, and 50 quad sets daily (10x 5 sets daily).       Patient will benefit from skilled therapeutic intervention in order to improve the following deficits and impairments:  Abnormal gait, Decreased activity tolerance, Decreased balance, Decreased mobility, Decreased knowledge of use of DME, Decreased endurance, Decreased range of motion, Decreased strength, Difficulty walking, Hypomobility, Increased edema, Increased fascial restricitons, Increased muscle spasms, Impaired flexibility, Improper body mechanics, Pain  Visit Diagnosis: Left knee pain, unspecified chronicity  Other  abnormalities of gait and mobility  Muscle weakness (generalized)  Other symptoms and signs involving the musculoskeletal system     Problem List Patient Active Problem List   Diagnosis Date Noted  . OA (osteoarthritis) of knee 12/11/2019  . Osteoarthritis of left knee 12/11/2019  . Lab test positive for detection of COVID-19 virus 08/19/2019  . Hyperlipidemia LDL goal <100 06/20/2018  . Erectile dysfunction 12/16/2016  . Essential hypertension 06/19/2016  . Neuropathy due to type 2 diabetes mellitus ([facility]) 06/19/2016  . Diabetes ([facility]CC) 03/20/2015  . Diverticulosis of colon without hemorrhage   . Elevated blood pressure 08/01/2014  . Prostate hypertrophy 06/25/2014  . Esophageal reflux 06/25/2014    10:29 AM, 01/17/20 01/19/20 PT, DPT Physical Therapist at [facility]  [facility] [address] [place], [place], [place] Phone: [phone]   Fax:  [phone]  Name: WANYA BANGURA MRN: Rozanna Box Date of Birth: Oct 08, 1960

## 2020-01-19 ENCOUNTER — Encounter (HOSPITAL_COMMUNITY): Payer: Self-pay

## 2020-01-19 ENCOUNTER — Other Ambulatory Visit: Payer: Self-pay

## 2020-01-19 ENCOUNTER — Ambulatory Visit (HOSPITAL_COMMUNITY): Payer: BC Managed Care – PPO

## 2020-01-19 DIAGNOSIS — R29898 Other symptoms and signs involving the musculoskeletal system: Secondary | ICD-10-CM | POA: Diagnosis not present

## 2020-01-19 DIAGNOSIS — R2689 Other abnormalities of gait and mobility: Secondary | ICD-10-CM

## 2020-01-19 DIAGNOSIS — M6281 Muscle weakness (generalized): Secondary | ICD-10-CM | POA: Diagnosis not present

## 2020-01-19 DIAGNOSIS — M25562 Pain in left knee: Secondary | ICD-10-CM

## 2020-01-19 NOTE — Therapy (Signed)
[facility] [address] [place], [place], [address] Phone: [phone]   Fax:  [phone]  Physical Therapy Treatment  Patient Details  Name: [name] [name] MRN: [id number] Date of Birth: 08/01/1961 Referring Provider (PT): Ollen Gross MD   Encounter Date: 01/19/2020  PT End of Session - 01/19/20 1053    Visit Number  15    Number of Visits  18    Date for PT Re-Evaluation  01/29/20    Authorization Type  BSBS (30 visit limit no auth)    Authorization - Visit Number  15    Authorization - Number of Visits  30    Progress Note Due on Visit  20    PT Start Time  1046    PT Stop Time  1128    PT Time Calculation (min)  42 min    Activity Tolerance  Patient tolerated treatment well;No increased pain    Behavior During Therapy  WFL for tasks assessed/performed       Past Medical History:  Diagnosis Date  . Anxiety   . Arthritis   . Diabetes mellitus without complication (HCC)   . Diverticulosis    pt unaware  . ED (erectile dysfunction)   . Fatty liver   . GERD (gastroesophageal reflux disease)   . History of COVID-19 07/2019  . History of left inguinal hernia   . History of retinal detachment    Right  . Hyperlipidemia   . Hypertension   . Insomnia   . Low ferritin level   . Numbness and tingling of both feet   . Shortness of breath dyspnea    since having COVID 07/2019 with exertions    Past Surgical History:  Procedure Laterality Date  . CHOLECYSTECTOMY    . COLONOSCOPY N/A 11/30/2014   Procedure: COLONOSCOPY;  Surgeon: Corbin Ade, MD;  [facility];  Service: Endoscopy;  Laterality: N/A;  7:30 Am  . ESOPHAGEAL DILATION    . HERNIA REPAIR     1981  . RETINAL DETACHMENT SURGERY Right   . TOTAL KNEE ARTHROPLASTY Left 12/11/2019   Procedure: TOTAL KNEE ARTHROPLASTY;  Surgeon: Ollen Gross, MD;  Location: [facility];  Service: Orthopedics;  Laterality: Left;   . VASCULAR SURGERY      There  were no vitals filed for this visit.  Subjective Assessment - 01/19/20 1049    Subjective  Pt stated he continues to have difficulty sleeping, had to get up at 3:30 and applied ice.  Current pain scale 2-3/10 Lt knee.  Pt stated MD happy with progress, plans to attend 2 more session then DC to HEP.    How long can you stand comfortably?  30-45 minutes    How long can you walk comfortably?  30-45 minutes    Patient Stated Goals  To get back to work    Currently in Pain?  Yes    Pain Score  3     Pain Location  Knee    Pain Orientation  Left    Pain Descriptors / Indicators  Aching;Tightness;Sore    Pain Type  Surgical pain    Pain Onset  More than a month ago    Pain Frequency  Intermittent    Pain Relieving Factors  ice    Effect of Pain on Daily Activities  limits  [facility] Adult PT Treatment/Exercise - 01/19/20 0001      Knee/Hip Exercises: Stretches   Active Hamstring Stretch  Left;3 reps;30 seconds    Active Hamstring Stretch Limitations  12in step height     Gastroc Stretch  3 reps;30 seconds    Gastroc Stretch Limitations  Slant board      Knee/Hip Exercises: Standing   Heel Raises  2 sets;10 reps    Heel Raises Limitations  squat heel raise combo 2 sets; chair behind for mechanics    Terminal Knee Extension  Left;10 reps;Theraband    Theraband Level (Terminal Knee Extension)  Other (comment)    Terminal Knee Extension Limitations  tke with quad set 10 second holds; retro toe to heel gait    Lateral Step Up  2 sets;10 reps;Hand Hold: 1;Limitations    Lateral Step Up Limitations  eccentric control, 7in step height    Step Down  10 reps;Step Height: 6";Left;Hand Hold: 1;Other (comment)   7in step height   Functional Squat  2 sets;10 reps    Functional Squat Limitations  squat heel raise combo 2 sets; chair behind for mechanics    Stairs  2RT reciprocal pattern 7in step height    SLS with Vectors  5x 5" intermittent HHA    Other Standing  Knee Exercises  heel walking 2RT      Knee/Hip Exercises: Supine   Knee Extension  AROM    Knee Extension Limitations  10    Knee Flexion  AROM    Knee Flexion Limitations  117      Manual Therapy   Manual Therapy  Edema management    Manual therapy comments  completed independently from all other aspects of treatment    Edema Management  retrograde massage with LE elevated               PT Short Term Goals - 01/08/20 0959      PT SHORT TERM GOAL #1   Title  Patient will be independent with HEP in order to improve functional outcomes.    Time  3    Period  Weeks    Status  Achieved    Target Date  01/08/20      PT SHORT TERM GOAL #2   Title  Patient will report at least 25% improvement in symptoms for improved quality of life.    Baseline  4/19 60% improvement    Time  3    Period  Weeks    Status  Achieved    Target Date  01/08/20        PT Long Term Goals - 01/08/20 1015      PT LONG TERM GOAL #1   Title  Patient will report at least 75% improvement in symptoms for improved quality of life.    Time  6    Period  Weeks    Status  On-going      PT LONG TERM GOAL #2   Title  Patient will improve FOTO score by at least 10 points in order to indicate improved tolerance to activity.    Baseline  4/19 59% limited    Time  6    Period  Weeks    Status  Achieved      PT LONG TERM GOAL #3   Title  Patient will be able to ambulate for at least 60 minutes with pain no greater than 1/10 in order to demonstrate improved ability to ambulate for exercise.  Time  6    Period  Weeks    Status  On-going      PT LONG TERM GOAL #4   Title  Patient will be able to complete 5x STS in under 11.4 seconds in order to reduce the risk of falls.    Time  6    Period  Weeks    Status  On-going            Plan - 01/19/20 1130    Clinical Impression Statement  Session focus with knee extension and quad/gluteal strengthening.  Pt continues to lack TKE ROM which  impairs gait.  Able to progress quad strengthening with increased height with lateral step ups and step down, cueing to address eccentric control for strengthening.  Noted swelling medial and lateral aspect of knee, EOS with manual retrograde massage for edema control.  Encouraged increased application of ice for edema and pain control.    Personal Factors and Comorbidities  Behavior Pattern;Comorbidity 2;Fitness;Past/Current Experience;Profession    Comorbidities  diabetes, high blood pressure    Examination-Activity Limitations  Bathing;Bed Mobility;Bend;Carry;Caring for Others;Dressing;Hygiene/Grooming;Lift;Locomotion Level;Sit;Sleep;Squat;Stairs;Stand;Toileting;Transfers    Examination-Participation Restrictions  Church;Cleaning;Community Activity;Driving;Laundry;Volunteer;Yard Work    Stability/Clinical Decision Making  Stable/Uncomplicated    Designer, jewellery  Low    Rehab Potential  Good    PT Frequency  3x / week    PT Duration  6 weeks    PT Treatment/Interventions  ADLs/Self Care Home Management;Aquatic Therapy;Electrical Stimulation;Cryotherapy;Biofeedback;Iontophoresis 4mg /ml Dexamethasone;Moist Heat;Traction;Ultrasound;DME Instruction;Contrast Bath;Gait training;Stair training;Functional mobility training;Therapeutic activities;Therapeutic exercise;Balance training;Neuromuscular re-education;Patient/family education;Manual techniques;Manual lymph drainage;Compression bandaging;Scar mobilization;Passive range of motion;Dry needling;Energy conservation;Splinting;Taping;Vasopneumatic Device;Spinal Manipulations;Joint Manipulations    PT Next Visit Plan  continue knee flexion/extension ROM exercises focus on extension > flexion, continue LLE strengthening exercises as able. Manual for extension    PT Home Exercise Plan  12/20/19 quad sets, SAQ, LAQ, heel slides in seated and supine 4/12 squat; prone knee hang, STS, and 50 quad sets daily (10x 5 sets daily).       Patient will benefit  from skilled therapeutic intervention in order to improve the following deficits and impairments:  Abnormal gait, Decreased activity tolerance, Decreased balance, Decreased mobility, Decreased knowledge of use of DME, Decreased endurance, Decreased range of motion, Decreased strength, Difficulty walking, Hypomobility, Increased edema, Increased fascial restricitons, Increased muscle spasms, Impaired flexibility, Improper body mechanics, Pain  Visit Diagnosis: Muscle weakness (generalized)  Other symptoms and signs involving the musculoskeletal system  Other abnormalities of gait and mobility  Left knee pain, unspecified chronicity     Problem List Patient Active Problem List   Diagnosis Date Noted  . OA (osteoarthritis) of knee 12/11/2019  . Osteoarthritis of left knee 12/11/2019  . Lab test positive for detection of COVID-19 virus 08/19/2019  . Hyperlipidemia LDL goal <100 06/20/2018  . Erectile dysfunction 12/16/2016  . Essential hypertension 06/19/2016  . Neuropathy due to type 2 diabetes mellitus ([place]) 06/19/2016  . Diabetes ([place]) 03/20/2015  . Diverticulosis of colon without hemorrhage   . Elevated blood pressure 08/01/2014  . Prostate hypertrophy 06/25/2014  . Esophageal reflux 06/25/2014   Ihor Austin, LPTA/CLT; [place]  Aldona Lento 01/19/2020, 12:29 PM  [facility] [address] [place], [place], [address] Phone: [phone]   Fax:  [phone]  Name: [name] [name] MRN: [id number] Date of Birth: 16-Sep-1961

## 2020-01-22 ENCOUNTER — Ambulatory Visit (HOSPITAL_COMMUNITY): Payer: BC Managed Care – PPO | Attending: Orthopedic Surgery | Admitting: Physical Therapy

## 2020-01-22 ENCOUNTER — Other Ambulatory Visit: Payer: Self-pay

## 2020-01-22 ENCOUNTER — Encounter (HOSPITAL_COMMUNITY): Payer: Self-pay | Admitting: Physical Therapy

## 2020-01-22 DIAGNOSIS — M25562 Pain in left knee: Secondary | ICD-10-CM | POA: Diagnosis not present

## 2020-01-22 DIAGNOSIS — R2689 Other abnormalities of gait and mobility: Secondary | ICD-10-CM | POA: Insufficient documentation

## 2020-01-22 DIAGNOSIS — R29898 Other symptoms and signs involving the musculoskeletal system: Secondary | ICD-10-CM | POA: Diagnosis not present

## 2020-01-22 DIAGNOSIS — M6281 Muscle weakness (generalized): Secondary | ICD-10-CM | POA: Diagnosis not present

## 2020-01-22 NOTE — Therapy (Signed)
[facility] [address] [place], [place], [address] Phone: [phone]   Fax:  [phone]  Physical Therapy Treatment  Patient Details  Name: [name] [name] MRN: [id number] Date of Birth: 12-Jun-1961 Referring Provider (PT): Ollen Gross MD   Encounter Date: 01/22/2020  PT End of Session - 01/22/20 1013    Visit Number  16    Number of Visits  18    Date for PT Re-Evaluation  01/29/20    Authorization Type  BSBS (30 visit limit no auth)    Authorization - Visit Number  16    Authorization - Number of Visits  30    Progress Note Due on Visit  20    PT Start Time  0946    PT Stop Time  1025    PT Time Calculation (min)  39 min    Activity Tolerance  Patient tolerated treatment well;No increased pain    Behavior During Therapy  WFL for tasks assessed/performed       Past Medical History:  Diagnosis Date  . Anxiety   . Arthritis   . Diabetes mellitus without complication (HCC)   . Diverticulosis    pt unaware  . ED (erectile dysfunction)   . Fatty liver   . GERD (gastroesophageal reflux disease)   . History of COVID-19 07/2019  . History of left inguinal hernia   . History of retinal detachment    Right  . Hyperlipidemia   . Hypertension   . Insomnia   . Low ferritin level   . Numbness and tingling of both feet   . Shortness of breath dyspnea    since having COVID 07/2019 with exertions    Past Surgical History:  Procedure Laterality Date  . CHOLECYSTECTOMY    . COLONOSCOPY N/A 11/30/2014   Procedure: COLONOSCOPY;  Surgeon: Corbin Ade, MD;  [facility];  Service: Endoscopy;  Laterality: N/A;  7:30 Am  . ESOPHAGEAL DILATION    . HERNIA REPAIR     1981  . RETINAL DETACHMENT SURGERY Right   . TOTAL KNEE ARTHROPLASTY Left 12/11/2019   Procedure: TOTAL KNEE ARTHROPLASTY;  Surgeon: Ollen Gross, MD;  [facility];  Service: Orthopedics;  Laterality: Left;   . VASCULAR SURGERY      There were  no vitals filed for this visit.  Subjective Assessment - 01/22/20 0941    Subjective  Patient reports his back is sore from sleeping in his bed. His knee is fine today, still has some pain in his knee and he gets up to ice his knee during the night due to pain.    How long can you stand comfortably?  30-45 minutes    How long can you walk comfortably?  30-45 minutes    Patient Stated Goals  To get back to work    Currently in Pain?  Yes    Pain Score  3     Pain Location  Knee    Pain Orientation  Left    Pain Onset  More than a month ago                       [facility] Adult PT Treatment/Exercise - 01/22/20 0001      Knee/Hip Exercises: Stretches   Active Hamstring Stretch  Left;3 reps;30 seconds    Active Hamstring Stretch Limitations  12in step height     Gastroc Stretch  3 reps;30 seconds  Gastroc Stretch Limitations  Slant board      Knee/Hip Exercises: Standing   Heel Raises  2 sets;10 reps    Heel Raises Limitations  on edge of step    Terminal Knee Extension  Left;10 reps;Theraband    Theraband Level (Terminal Knee Extension)  Other (comment)    Terminal Knee Extension Limitations  tke with quad set 10 second holds; retro toe to heel gait    Lateral Step Up  2 sets;10 reps;Hand Hold: 1;Limitations    Lateral Step Up Limitations  eccentric control, 7in step height    Step Down  10 reps;Step Height: 6";Left;Hand Hold: 1    Functional Squat  2 sets;10 reps    Functional Squat Limitations  on slant board    Stairs  4RT reciprocal pattern 7in step height    Other Standing Knee Exercises  retro walking with resistance 2x10 50#    Other Standing Knee Exercises  heel walking 2RT      Knee/Hip Exercises: Supine   Knee Extension  AROM    Knee Extension Limitations  10    Knee Flexion  AROM    Knee Flexion Limitations  117             PT Education - 01/22/20 0948    Education Details  Review of HEP, continuing with HEP    Person(s) Educated  Patient     Methods  Explanation;Demonstration    Comprehension  Verbalized understanding;Returned demonstration       PT Short Term Goals - 01/08/20 0959      PT SHORT TERM GOAL #1   Title  Patient will be independent with HEP in order to improve functional outcomes.    Time  3    Period  Weeks    Status  Achieved    Target Date  01/08/20      PT SHORT TERM GOAL #2   Title  Patient will report at least 25% improvement in symptoms for improved quality of life.    Baseline  4/19 60% improvement    Time  3    Period  Weeks    Status  Achieved    Target Date  01/08/20        PT Long Term Goals - 01/08/20 1015      PT LONG TERM GOAL #1   Title  Patient will report at least 75% improvement in symptoms for improved quality of life.    Time  6    Period  Weeks    Status  On-going      PT LONG TERM GOAL #2   Title  Patient will improve FOTO score by at least 10 points in order to indicate improved tolerance to activity.    Baseline  4/19 59% limited    Time  6    Period  Weeks    Status  Achieved      PT LONG TERM GOAL #3   Title  Patient will be able to ambulate for at least 60 minutes with pain no greater than 1/10 in order to demonstrate improved ability to ambulate for exercise.    Time  6    Period  Weeks    Status  On-going      PT LONG TERM GOAL #4   Title  Patient will be able to complete 5x STS in under 11.4 seconds in order to reduce the risk of falls.    Time  6    Period  Weeks    Status  On-going            Plan - 01/22/20 1014    Clinical Impression Statement  Patient ROM same as last session and continues to lack end range extension. He completes exercises with proper mechanics following demonstration and cueing for positioning. He continues to show impaired eccentric quad strength with stair exercises. He fatigues quickly with TKE with resistance. He requires several rest breaks during session. He requires frequent verbal cueing to limit UE use throughout  treatment. Patient will continue to benefit from skilled physical therapy in order to reduce impairment and improve function.    Personal Factors and Comorbidities  Behavior Pattern;Comorbidity 2;Fitness;Past/Current Experience;Profession    Comorbidities  diabetes, high blood pressure    Examination-Activity Limitations  Bathing;Bed Mobility;Bend;Carry;Caring for Others;Dressing;Hygiene/Grooming;Lift;Locomotion Level;Sit;Sleep;Squat;Stairs;Stand;Toileting;Transfers    Examination-Participation Restrictions  Church;Cleaning;Community Activity;Driving;Laundry;Volunteer;Yard Work    Stability/Clinical Decision Making  Stable/Uncomplicated    Rehab Potential  Good    PT Frequency  3x / week    PT Duration  6 weeks    PT Treatment/Interventions  ADLs/Self Care Home Management;Aquatic Therapy;Electrical Stimulation;Cryotherapy;Biofeedback;Iontophoresis 4mg /ml Dexamethasone;Moist Heat;Traction;Ultrasound;DME Instruction;Contrast Bath;Gait training;Stair training;Functional mobility training;Therapeutic activities;Therapeutic exercise;Balance training;Neuromuscular re-education;Patient/family education;Manual techniques;Manual lymph drainage;Compression bandaging;Scar mobilization;Passive range of motion;Dry needling;Energy conservation;Splinting;Taping;Vasopneumatic Device;Spinal Manipulations;Joint Manipulations    PT Next Visit Plan  continue knee flexion/extension ROM exercises focus on extension > flexion, continue LLE strengthening exercises as able. Manual for extension; antcipate d/c next session    PT Home Exercise Plan  12/20/19 quad sets, SAQ, LAQ, heel slides in seated and supine 4/12 squat; prone knee hang, STS, and 50 quad sets daily (10x 5 sets daily).       Patient will benefit from skilled therapeutic intervention in order to improve the following deficits and impairments:  Abnormal gait, Decreased activity tolerance, Decreased balance, Decreased mobility, Decreased knowledge of use of DME,  Decreased endurance, Decreased range of motion, Decreased strength, Difficulty walking, Hypomobility, Increased edema, Increased fascial restricitons, Increased muscle spasms, Impaired flexibility, Improper body mechanics, Pain  Visit Diagnosis: Left knee pain, unspecified chronicity  Other abnormalities of gait and mobility  Other symptoms and signs involving the musculoskeletal system  Muscle weakness (generalized)     Problem List Patient Active Problem List   Diagnosis Date Noted  . OA (osteoarthritis) of knee 12/11/2019  . Osteoarthritis of left knee 12/11/2019  . Lab test positive for detection of COVID-19 virus 08/19/2019  . Hyperlipidemia LDL goal <100 06/20/2018  . Erectile dysfunction 12/16/2016  . Essential hypertension 06/19/2016  . Neuropathy due to type 2 diabetes mellitus (HCC) 06/19/2016  . Diabetes (HCC) 03/20/2015  . Diverticulosis of colon without hemorrhage   . Elevated blood pressure 08/01/2014  . Prostate hypertrophy 06/25/2014  . Esophageal reflux 06/25/2014    10:27 AM, 01/22/20 03/23/20 PT, DPT Physical Therapist at [facility]  [facility] [address] [place], [place], [place] Phone: [phone]   Fax:  [phone]  Name: [name] [name] MRN: [id number] Date of Birth: [birth year]

## 2020-01-23 DIAGNOSIS — B351 Tinea unguium: Secondary | ICD-10-CM | POA: Diagnosis not present

## 2020-01-24 ENCOUNTER — Encounter (HOSPITAL_COMMUNITY): Payer: Self-pay | Admitting: Physical Therapy

## 2020-01-24 ENCOUNTER — Ambulatory Visit (HOSPITAL_COMMUNITY): Payer: BC Managed Care – PPO | Admitting: Physical Therapy

## 2020-01-24 ENCOUNTER — Other Ambulatory Visit: Payer: Self-pay

## 2020-01-24 DIAGNOSIS — R2689 Other abnormalities of gait and mobility: Secondary | ICD-10-CM | POA: Diagnosis not present

## 2020-01-24 DIAGNOSIS — M25562 Pain in left knee: Secondary | ICD-10-CM | POA: Diagnosis not present

## 2020-01-24 DIAGNOSIS — M6281 Muscle weakness (generalized): Secondary | ICD-10-CM | POA: Diagnosis not present

## 2020-01-24 DIAGNOSIS — R29898 Other symptoms and signs involving the musculoskeletal system: Secondary | ICD-10-CM

## 2020-01-24 NOTE — Therapy (Signed)
[facility] [address] [place], [place], [address] Phone: [phone]   Fax:  [phone]  Physical Therapy Treatment/Discharge Summary  Patient Details  Name: [name] [name] MRN: [id number] Date of Birth: 1961-03-22 Referring Provider (PT): Gaynelle Arabian MD   Encounter Date: 01/24/2020  PHYSICAL THERAPY DISCHARGE SUMMARY  Visits from Start of Care: 17  Current functional level related to goals / functional outcomes: See below   Remaining deficits: See below   Education / Equipment: See below  Plan: Patient agrees to discharge.  Patient goals were met. Patient is being discharged due to being pleased with the current functional level.  ?????       PT End of Session - 01/24/20 1000    Visit Number  17    Number of Visits  18    Date for PT Re-Evaluation  01/29/20    Authorization Type  BSBS (30 visit limit no auth)    Authorization - Visit Number  17    Authorization - Number of Visits  30    Progress Note Due on Visit  20    PT Start Time  0950    PT Stop Time  1015    PT Time Calculation (min)  25 min    Activity Tolerance  Patient tolerated treatment well;No increased pain    Behavior During Therapy  WFL for tasks assessed/performed       Past Medical History:  Diagnosis Date  . Anxiety   . Arthritis   . Diabetes mellitus without complication ([place])   . Diverticulosis    pt unaware  . ED (erectile dysfunction)   . Fatty liver   . GERD (gastroesophageal reflux disease)   . History of COVID-19 07/2019  . History of left inguinal hernia   . History of retinal detachment    Right  . Hyperlipidemia   . Hypertension   . Insomnia   . Low ferritin level   . Numbness and tingling of both feet   . Shortness of breath dyspnea    since having COVID 07/2019 with exertions    Past Surgical History:  Procedure Laterality Date  . CHOLECYSTECTOMY    . COLONOSCOPY N/A 11/30/2014   Procedure: COLONOSCOPY;  Surgeon:  Daneil Dolin, MD;  Location: [facility];  Service: Endoscopy;  Laterality: N/A;  7:30 Am  . ESOPHAGEAL DILATION    . HERNIA REPAIR     1981  . RETINAL DETACHMENT SURGERY Right   . TOTAL KNEE ARTHROPLASTY Left 12/11/2019   Procedure: TOTAL KNEE ARTHROPLASTY;  Surgeon: Gaynelle Arabian, MD;  Location: [facility];  Service: Orthopedics;  Laterality: Left;  57mn  . VASCULAR SURGERY      There were no vitals filed for this visit.  Subjective Assessment - 01/24/20 0949    Subjective  Patient was very sore after last session but slept well last night. Patient is ready to be discharged from therapy. Patient states 100% improvement since beginning physical therapy. Patient feels limited with standing and walking due to increased pain after about 45 - 60 minutes. His knee gets tired.    Limitations  Walking;House hold activities;Standing    How long can you stand comfortably?  60 minutes    How long can you walk comfortably?  60    Patient Stated Goals  To get back to work    Currently in Pain?  Yes    Pain Score  2     Pain Location  Knee    Pain Orientation  Left    Pain Onset  More than a month ago         [facility] PT Assessment - 01/24/20 0001      Assessment   Medical Diagnosis  L TKA    Referring Provider (PT)  Gaynelle Arabian MD    Onset Date/Surgical Date  12/11/19    Next MD Visit  4 weeks    Prior Therapy  None      Precautions   Precautions  Knee      Restrictions   Weight Bearing Restrictions  No      Balance Screen   Has the patient fallen in the past 6 months  No    Has the patient had a decrease in activity level because of a fear of falling?   No    Is the patient reluctant to leave their home because of a fear of falling?   No      Prior Function   Level of Independence  Independent    Vocation  Full time employment    Vocation Requirements  Build Cabinets      Cognition   Overall Cognitive Status  Within Functional Limits for tasks assessed       Observation/Other Assessments   Observations  Ambualtes with no AD, lacking TKE    Focus on Therapeutic Outcomes (FOTO)   12% limited      AROM   Left Knee Extension  10   lacking   Left Knee Flexion  117      Strength   Right Hip Flexion  4+/5    Left Hip Flexion  4+/5    Right Knee Flexion  5/5    Right Knee Extension  5/5    Left Knee Flexion  5/5    Left Knee Extension  5/5    Right Ankle Dorsiflexion  5/5    Left Ankle Dorsiflexion  5/5      Transfers   Five time sit to stand comments   7.97 seconds    Comments  weight shift off LLE minimal      Ambulation/Gait   Ambulation/Gait Assistance  7: Independent    Ambulation Distance (Feet)  100 Feet    Assistive device  None    Gait Pattern  Antalgic;Left flexed knee in stance;Decreased hip/knee flexion - left;Decreased stance time - left                           PT Education - 01/24/20 0956    Education Details  Review of HEP, continuing with HEP, progress made, returning to PT if needed, positioning knee in extension, edema message    Person(s) Educated  Patient    Methods  Explanation;Demonstration    Comprehension  Verbalized understanding;Returned demonstration       PT Short Term Goals - 01/24/20 0957      PT SHORT TERM GOAL #1   Title  Patient will be independent with HEP in order to improve functional outcomes.    Time  3    Period  Weeks    Status  Achieved    Target Date  01/08/20      PT SHORT TERM GOAL #2   Title  Patient will report at least 25% improvement in symptoms for improved quality of life.    Baseline  4/19 60% improvement 5/5 100%    Time  3    Period  Weeks    Status  Achieved    Target Date  01/08/20        PT Long Term Goals - 01/24/20 0958      PT LONG TERM GOAL #1   Title  Patient will report at least 75% improvement in symptoms for improved quality of life.    Baseline  5/5 100%    Time  6    Period  Weeks    Status  Achieved      PT LONG TERM GOAL  #2   Title  Patient will improve FOTO score by at least 10 points in order to indicate improved tolerance to activity.    Baseline  4/19 59% limited    Time  6    Period  Weeks    Status  Achieved      PT LONG TERM GOAL #3   Title  Patient will be able to ambulate for at least 60 minutes with pain no greater than 1/10 in order to demonstrate improved ability to ambulate for exercise.    Time  6    Period  Weeks    Status  Partially Met      PT LONG TERM GOAL #4   Title  Patient will be able to complete 5x STS in under 11.4 seconds in order to reduce the risk of falls.    Time  6    Period  Weeks    Status  Achieved            Plan - 01/24/20 1019    Clinical Impression Statement  Patient has met 2/2 short term goals with ability to complete HEP and improvement in symptoms. Patient has met 3/4 long term goals with improved activity tolerance, improved function, improved functional strength and mobility and has made good progress toward remaining goal with ambulation ability but continues to be limited with pain after extended periods of standing/ambulating. Patient has improved in strength, gait, transfers, and activity tolerance allowing for increased participation with ADL. Patient demonstrating improved strength and functional mobility but continues to lack end range extension despite extensive efforts and education to improve terminal knee extension. Patient educated on progress made, returning to physical therapy if any issues arise. Patient discharged from physical therapy at this time.    Personal Factors and Comorbidities  Behavior Pattern;Comorbidity 2;Fitness;Past/Current Experience;Profession    Comorbidities  diabetes, high blood pressure    Examination-Activity Limitations  Bathing;Bed Mobility;Bend;Carry;Caring for Others;Dressing;Hygiene/Grooming;Lift;Locomotion Level;Sit;Sleep;Squat;Stairs;Stand;Toileting;Transfers    Examination-Participation Restrictions   Church;Cleaning;Community Activity;Driving;Laundry;Volunteer;Yard Work    Stability/Clinical Decision Making  Stable/Uncomplicated    Rehab Potential  Good    PT Frequency  3x / week    PT Duration  6 weeks    PT Treatment/Interventions  ADLs/Self Care Home Management;Aquatic Therapy;Electrical Stimulation;Cryotherapy;Biofeedback;Iontophoresis '4mg'$ /ml Dexamethasone;Moist Heat;Traction;Ultrasound;DME Instruction;Contrast Bath;Gait training;Stair training;Functional mobility training;Therapeutic activities;Therapeutic exercise;Balance training;Neuromuscular re-education;Patient/family education;Manual techniques;Manual lymph drainage;Compression bandaging;Scar mobilization;Passive range of motion;Dry needling;Energy conservation;Splinting;Taping;Vasopneumatic Device;Spinal Manipulations;Joint Manipulations    PT Next Visit Plan  n/a    PT Home Exercise Plan  12/20/19 quad sets, SAQ, LAQ, heel slides in seated and supine 4/12 squat; prone knee hang, STS, and 50 quad sets daily (10x 5 sets daily).    Consulted and Agree with Plan of Care  Patient       Patient will benefit from skilled therapeutic intervention in order to improve the following deficits and impairments:  Abnormal gait, Decreased activity tolerance, Decreased balance, Decreased mobility, Decreased knowledge of use of DME, Decreased endurance,  Decreased range of motion, Decreased strength, Difficulty walking, Hypomobility, Increased edema, Increased fascial restricitons, Increased muscle spasms, Impaired flexibility, Improper body mechanics, Pain  Visit Diagnosis: Left knee pain, unspecified chronicity  Other abnormalities of gait and mobility  Other symptoms and signs involving the musculoskeletal system  Muscle weakness (generalized)     Problem List Patient Active Problem List   Diagnosis Date Noted  . OA (osteoarthritis) of knee 12/11/2019  . Osteoarthritis of left knee 12/11/2019  . Lab test positive for detection of  COVID-19 virus 08/19/2019  . Hyperlipidemia LDL goal <100 06/20/2018  . Erectile dysfunction 12/16/2016  . Essential hypertension 06/19/2016  . Neuropathy due to type 2 diabetes mellitus ([place]) 06/19/2016  . Diabetes ([place]) 03/20/2015  . Diverticulosis of colon without hemorrhage   . Elevated blood pressure 08/01/2014  . Prostate hypertrophy 06/25/2014  . Esophageal reflux 06/25/2014    10:25 AM, 01/24/20 Mearl Latin PT, DPT Physical Therapist at [facility] [place], [place], [address] Phone: [phone]   Fax:  [phone]  Name: JACQUE GARRELS MRN: [id number] Date of Birth: 11-17-1960

## 2020-01-26 ENCOUNTER — Ambulatory Visit (HOSPITAL_COMMUNITY): Payer: BC Managed Care – PPO

## 2020-01-29 ENCOUNTER — Encounter (HOSPITAL_COMMUNITY): Payer: BC Managed Care – PPO | Admitting: Physical Therapy

## 2020-01-31 ENCOUNTER — Encounter (HOSPITAL_COMMUNITY): Payer: BC Managed Care – PPO | Admitting: Physical Therapy

## 2020-02-02 ENCOUNTER — Encounter (HOSPITAL_COMMUNITY): Payer: BC Managed Care – PPO

## 2020-02-05 ENCOUNTER — Other Ambulatory Visit: Payer: Self-pay | Admitting: Family Medicine

## 2020-02-05 ENCOUNTER — Encounter (HOSPITAL_COMMUNITY): Payer: BC Managed Care – PPO | Admitting: Physical Therapy

## 2020-02-06 ENCOUNTER — Other Ambulatory Visit: Payer: Self-pay | Admitting: Family Medicine

## 2020-02-07 ENCOUNTER — Ambulatory Visit (INDEPENDENT_AMBULATORY_CARE_PROVIDER_SITE_OTHER): Payer: BC Managed Care – PPO | Admitting: Family Medicine

## 2020-02-07 ENCOUNTER — Encounter (HOSPITAL_COMMUNITY): Payer: BC Managed Care – PPO | Admitting: Physical Therapy

## 2020-02-07 ENCOUNTER — Encounter: Payer: Self-pay | Admitting: Family Medicine

## 2020-02-07 ENCOUNTER — Other Ambulatory Visit: Payer: Self-pay

## 2020-02-07 VITALS — BP 122/80 | Temp 97.8°F | Ht 73.0 in | Wt 192.0 lb

## 2020-02-07 DIAGNOSIS — I10 Essential (primary) hypertension: Secondary | ICD-10-CM | POA: Diagnosis not present

## 2020-02-07 DIAGNOSIS — E119 Type 2 diabetes mellitus without complications: Secondary | ICD-10-CM | POA: Diagnosis not present

## 2020-02-07 DIAGNOSIS — M545 Low back pain: Secondary | ICD-10-CM | POA: Diagnosis not present

## 2020-02-07 DIAGNOSIS — F411 Generalized anxiety disorder: Secondary | ICD-10-CM

## 2020-02-07 DIAGNOSIS — Z0000 Encounter for general adult medical examination without abnormal findings: Secondary | ICD-10-CM | POA: Diagnosis not present

## 2020-02-07 LAB — POCT GLYCOSYLATED HEMOGLOBIN (HGB A1C): Hemoglobin A1C: 6.3 % — AB (ref 4.0–5.6)

## 2020-02-07 MED ORDER — LOSARTAN POTASSIUM 50 MG PO TABS: 50.0000 mg | ORAL_TABLET | Freq: Every day | ORAL | 5 refills | Status: DC

## 2020-02-07 MED ORDER — ATORVASTATIN CALCIUM 20 MG PO TABS: 20.0000 mg | ORAL_TABLET | Freq: Every day | ORAL | 5 refills | Status: DC

## 2020-02-07 MED ORDER — TAMSULOSIN HCL 0.4 MG PO CAPS: 0.8000 mg | ORAL_CAPSULE | Freq: Every day | ORAL | 5 refills | Status: DC

## 2020-02-07 MED ORDER — GLIPIZIDE 5 MG PO TABS: ORAL_TABLET | ORAL | 5 refills | Status: DC

## 2020-02-07 MED ORDER — TRAMADOL HCL 50 MG PO TABS: ORAL_TABLET | ORAL | 5 refills | Status: DC

## 2020-02-07 MED ORDER — LORAZEPAM 1 MG PO TABS: ORAL_TABLET | ORAL | 5 refills | Status: DC

## 2020-02-07 MED ORDER — DUTASTERIDE 0.5 MG PO CAPS: ORAL_CAPSULE | ORAL | 5 refills | Status: DC

## 2020-02-07 MED ORDER — METFORMIN HCL 500 MG PO TABS: 1000.0000 mg | ORAL_TABLET | Freq: Two times a day (BID) | ORAL | 5 refills | Status: DC

## 2020-02-07 MED ORDER — OMEPRAZOLE 20 MG PO CPDR: DELAYED_RELEASE_CAPSULE | ORAL | 5 refills | Status: DC

## 2020-02-07 MED ORDER — ESCITALOPRAM OXALATE 10 MG PO TABS: ORAL_TABLET | ORAL | 5 refills | Status: DC

## 2020-02-07 NOTE — Progress Notes (Signed)
Subjective:    [name], male    DOB: June 12, 1961, 59 y.o.   MRN: [id number]  HPI The patient comes in today for a wellness visit.    A review of their health history was completed.  A review of medications was also completed.  Any needed refills; update meds  Eating habits: health conscious  Falls/  MVA accidents in past few months: none  Regular exercise: none  Specialist pt sees on regular basis: podiatrist -  for toe fungus  and  knee - [place]  Preventative health issues were discussed.   Additional concerns: needs something for low back pain besides tylenol or aleve.   Aleve causing stomach tenderness and discomfort  Takes tylenol two extra strength up to a couple ties per day   Sugar this morn 139  Blood pressure medicine and blood pressure levels reviewed today with patient. Compliant with blood pressure medicine. States does not miss a dose. No obvious side effects. Blood pressure generally good when checked elsewhere. Watching salt intake.   Patient claims compliance with diabetes medication. No obvious side effects. Reports no substantial low sugar spells. Most numbers are generally in good range when checked fasting. Generally does not miss a dose of medication. Watching diabetic diet closely  Patient takes Lexapro for anxiety.  Compliant with medication as well.  Definitely still helps  Results for orders placed or performed in visit on 02/07/20  POCT glycosylated hemoglobin (Hb A1C)  Result Value Ref Range   Hemoglobin A1C 6.3 (A) 4.0 - 5.6 %   HbA1c POC (<> result, manual entry)     HbA1c, POC (prediabetic range)     HbA1c, POC (controlled diabetic range)     Patient notes substantial knee and back pain.  Sometimes fairly severe.  Tylenol at times just simply does not cut it    Review of Systems No headache, no major weight loss or weight gain, no chest pain no back pain abdominal pain no change in bowel habits complete  ROS otherwise negative     Objective:   Physical Exam Vitals reviewed.  Constitutional:      Appearance: He is well-developed.  HENT:     Head: Normocephalic and atraumatic.     Right Ear: External ear normal.     Left Ear: External ear normal.     Nose: Nose normal.  Eyes:     Pupils: Pupils are equal, round, and reactive to light.  Neck:     Thyroid: No thyromegaly.  Cardiovascular:     Rate and Rhythm: Normal rate and regular rhythm.     Heart sounds: Normal heart sounds. No murmur.  Pulmonary:     Effort: Pulmonary effort is normal. No respiratory distress.     Breath sounds: Normal breath sounds. No wheezing.  Abdominal:     General: Bowel sounds are normal. There is no distension.     Palpations: Abdomen is soft. There is no mass.     Tenderness: There is no abdominal tenderness.  Genitourinary:    Penis: Normal.   Musculoskeletal:        General: Normal range of motion.     Cervical back: Normal range of motion and neck supple.  Lymphadenopathy:     Cervical: No cervical adenopathy.  Skin:    General: Skin is warm and dry.     Findings: No erythema.  Neurological:     Mental Status: He is alert.     Motor: No abnormal  muscle tone.  Psychiatric:        Behavior: Behavior normal.        Judgment: Judgment normal.    Feet pulses good sensation intact       Assessment & Plan:  Impression 1 wellness exam.  Diet discussed.  Exercise discussed.  Up-to-date on colonoscopy per patient  2.  Type 2 diabetes excellent control discussed to maintain same therapy  3.  Hypertension clinically stable discussed to maintain same  4.  Back pain and knee pain fairly severe.  Will add tramadol.  May add 1/day.  Also Tylenol use discussed  5.  Hyperlipidemia.  Stable  6.  Anxiety/insomnia.  Medications refilled  Diet exercise discussed medications refilled

## 2020-02-09 ENCOUNTER — Encounter (HOSPITAL_COMMUNITY): Payer: BC Managed Care – PPO | Admitting: Physical Therapy

## 2020-02-12 ENCOUNTER — Encounter (HOSPITAL_COMMUNITY): Payer: BC Managed Care – PPO | Admitting: Physical Therapy

## 2020-02-14 ENCOUNTER — Encounter (HOSPITAL_COMMUNITY): Payer: BC Managed Care – PPO | Admitting: Physical Therapy

## 2020-02-16 ENCOUNTER — Encounter (HOSPITAL_COMMUNITY): Payer: BC Managed Care – PPO

## 2020-04-02 DIAGNOSIS — B351 Tinea unguium: Secondary | ICD-10-CM | POA: Diagnosis not present

## 2020-05-17 ENCOUNTER — Other Ambulatory Visit: Payer: Self-pay | Admitting: Family Medicine

## 2020-05-20 NOTE — Telephone Encounter (Signed)
Dr. Taylor's patient.

## 2020-05-21 NOTE — Telephone Encounter (Signed)
Please schedule visit with dr Ladona Ridgel to get established then route back

## 2020-05-22 NOTE — Telephone Encounter (Signed)
Scheduled 9/21

## 2020-06-11 ENCOUNTER — Other Ambulatory Visit: Payer: Self-pay

## 2020-06-11 ENCOUNTER — Ambulatory Visit (INDEPENDENT_AMBULATORY_CARE_PROVIDER_SITE_OTHER): Payer: BC Managed Care – PPO | Admitting: Family Medicine

## 2020-06-11 ENCOUNTER — Encounter: Payer: Self-pay | Admitting: Family Medicine

## 2020-06-11 VITALS — BP 132/78 | HR 70 | Temp 97.4°F | Ht 73.0 in | Wt 205.0 lb

## 2020-06-11 DIAGNOSIS — D649 Anemia, unspecified: Secondary | ICD-10-CM

## 2020-06-11 DIAGNOSIS — I10 Essential (primary) hypertension: Secondary | ICD-10-CM | POA: Diagnosis not present

## 2020-06-11 DIAGNOSIS — M545 Low back pain, unspecified: Secondary | ICD-10-CM

## 2020-06-11 DIAGNOSIS — N5201 Erectile dysfunction due to arterial insufficiency: Secondary | ICD-10-CM | POA: Diagnosis not present

## 2020-06-11 DIAGNOSIS — E785 Hyperlipidemia, unspecified: Secondary | ICD-10-CM

## 2020-06-11 DIAGNOSIS — E119 Type 2 diabetes mellitus without complications: Secondary | ICD-10-CM

## 2020-06-11 DIAGNOSIS — G8929 Other chronic pain: Secondary | ICD-10-CM

## 2020-06-11 DIAGNOSIS — G4700 Insomnia, unspecified: Secondary | ICD-10-CM

## 2020-06-11 MED ORDER — TRAMADOL HCL 50 MG PO TABS: ORAL_TABLET | ORAL | 5 refills | Status: DC

## 2020-06-11 MED ORDER — LORAZEPAM 0.5 MG PO TABS: 0.5000 mg | ORAL_TABLET | Freq: Every day | ORAL | 2 refills | Status: DC

## 2020-06-11 MED ORDER — TADALAFIL 20 MG PO TABS: ORAL_TABLET | ORAL | 0 refills | Status: DC

## 2020-06-11 NOTE — Progress Notes (Signed)
[name], male    DOB: March 01, 1961, 59 y.o.   MRN: [id number]   Chief Complaint  Patient presents with   Establish Care   Diabetes   Arthritis   Hyperlipidemia   Subjective:    HPI  Pt here to establish care and get refills on meds.  Terrible pain in right side of neck for last couple of days. Yesterday it felt like a sore place on back of neck. Is better today.  Pt takes 2 Tylenol each morning. Would like to come off Lexapro.  Pt had recent knee replacement. Pt has less energy than normal.   Rt neck pain/shoulder and hard to lean over and back of neck yesterday. Feeling better today. Hurting at night with rolling over.  Took 2 tylenol.  Does take 2 tylenol in am due to knee pain and was on aleve, told to stop it.  Told had blood in urine at some pint and pt didn't follow up.  On iron tablets bid in spirng 2021, prior to surgery. Had anemia in 3/21 hb 10 and 11. Had left knee surgery in 3/21. Has low energy since surgery. Taking lexapro is making him tired.  Lower back and needing to take tramadol at night.   Seeing podiatry, no issues. Hasn't been to eye doctor. Has number to call the eye doctor, last one in 2018.  Medical History [name] [name] has a past medical history of Anxiety, Arthritis, Diabetes mellitus without complication ([place]), Diverticulosis, ED (erectile dysfunction), Fatty liver, GERD (gastroesophageal reflux disease), History of COVID-19 (07/2019), History of left inguinal hernia, History of retinal detachment, Hyperlipidemia, Hypertension, Insomnia, Low ferritin level, Numbness and tingling of both feet, and Shortness of breath dyspnea.   Outpatient Encounter Medications as of 06/11/2020  Medication Sig   atorvastatin (LIPITOR) 20 MG tablet Take 1 tablet (20 mg total) by mouth at bedtime.   blood glucose meter kit and supplies KIT Dispense based on patient and insurance preference. Test sugar up to one time a day   dutasteride  (AVODART) 0.5 MG capsule TAKE 1 CAPSULE DAILY.   fluticasone (FLONASE) 50 MCG/ACT nasal spray USE 2 SPRAYS IN EACH NOSTRIL EVERY DAY, AS NEEDED. (Patient taking differently: Place 2 sprays into both nostrils daily as needed (congestion.). )   gabapentin (NEURONTIN) 300 MG capsule Take 1 capsule (300 mg total) by mouth 3 (three) times daily. Take a 300 mg capsule three times a day for two weeks following surgery.Then take a 300 mg capsule two times a day for two weeks. Then take a 300 mg capsule once a day for two weeks. Then discontinue.   glipiZIDE (GLUCOTROL) 5 MG tablet TAKE 2 TABLETS TWICE DAILY BEFORE A MEAL.   losartan (COZAAR) 50 MG tablet Take 1 tablet (50 mg total) by mouth daily.   metFORMIN (GLUCOPHAGE) 500 MG tablet Take 2 tablets (1,000 mg total) by mouth 2 (two) times daily.   omeprazole (PRILOSEC) 20 MG capsule TAKE 1 CAPSULE TWICE DAILY BEFORE MEALS.,   ONETOUCH DELICA LANCETS 31D MISC    ONETOUCH VERIO test strip    tamsulosin (FLOMAX) 0.4 MG CAPS capsule Take 2 capsules (0.8 mg total) by mouth at bedtime.   traMADol (ULTRAM) 50 MG tablet Take one tablet qd prn pain   [DISCONTINUED] escitalopram (LEXAPRO) 10 MG tablet Take one tablet po each morning   [DISCONTINUED] LORazepam (ATIVAN) 1 MG tablet TAKE 1 TABLET AT BEDTIME AS NEEDED FOR ANXIETY OR SLEEP.   [DISCONTINUED] sildenafil (REVATIO) 20 MG  tablet TAKE 2 OR 3 TABLETS BY MOUTH 2 HOURS BEFORE SEX. (Patient taking differently: Take 40-60 mg by mouth daily as needed (ED). Take 2 hours before sex)   [DISCONTINUED] traMADol (ULTRAM) 50 MG tablet Take one tablet qd prn pain   LORazepam (ATIVAN) 0.5 MG tablet Take 1 tablet (0.5 mg total) by mouth at bedtime.   tadalafil (CIALIS) 20 MG tablet Take 1/2 -1 tab p.o. 30 mins prior to intercourse.   [DISCONTINUED] albuterol (VENTOLIN HFA) 108 (90 Base) MCG/ACT inhaler Inhale 2 puffs into the lungs every 6 (six) hours as needed for wheezing or shortness of breath.    [DISCONTINUED] methocarbamol (ROBAXIN) 500 MG tablet Take 1 tablet (500 mg total) by mouth every 6 (six) hours as needed for muscle spasms.   No facility-administered encounter medications on file as of 06/11/2020.     Review of Systems  Constitutional: Negative for chills and fever.  HENT: Negative for congestion, rhinorrhea and sore throat.   Respiratory: Negative for cough, shortness of breath and wheezing.   Cardiovascular: Negative for chest pain and leg swelling.  Gastrointestinal: Negative for abdominal pain, diarrhea, nausea and vomiting.  Genitourinary: Negative for dysuria and frequency.  Musculoskeletal: Positive for arthralgias and neck pain.  Skin: Negative for rash.  Neurological: Negative for dizziness, weakness and headaches.  Psychiatric/Behavioral: Positive for sleep disturbance. The patient is not nervous/anxious.      Vitals BP 132/78    Pulse 70    Temp (!) 97.4 F (36.3 C)    Ht _0  (1.854 m)    Wt 205 lb (93 kg)    SpO2 98%    BMI 27.05 kg/m   Objective:   Physical Exam Vitals and nursing note reviewed.  Constitutional:      General: He is not in acute distress.    Appearance: Normal appearance. He is not ill-appearing.  HENT:     Head: Normocephalic.     Nose: Nose normal. No congestion.     Mouth/Throat:     Mouth: Mucous membranes are moist.     Pharynx: No oropharyngeal exudate.  Eyes:     Extraocular Movements: Extraocular movements intact.     Conjunctiva/sclera: Conjunctivae normal.     Pupils: Pupils are equal, round, and reactive to light.  Cardiovascular:     Rate and Rhythm: Normal rate and regular rhythm.     Pulses: Normal pulses.     Heart sounds: Normal heart sounds. No murmur heard.   Pulmonary:     Effort: Pulmonary effort is normal.     Breath sounds: Normal breath sounds. No wheezing, rhonchi or rales.  Musculoskeletal:        General: Normal range of motion.     Right lower leg: No edema.     Left lower leg: No edema.    Skin:    General: Skin is warm and dry.     Findings: No rash.  Neurological:     General: No focal deficit present.     Mental Status: He is alert and oriented to person, place, and time.     Cranial Nerves: No cranial nerve deficit.  Psychiatric:        Mood and Affect: Mood normal.        Behavior: Behavior normal.        Thought Content: Thought content normal.        Judgment: Judgment normal.      Assessment and Plan   1. Type 2 diabetes mellitus  without complication, without long-term current use of insulin ([facility]) - CBC - CMP14+EGFR - Lipid panel - Urinalysis - Hemoglobin A1c; Future - Urine Microalbumin w/creat. ratio - Hgb A1c w/o eAG  2. Hyperlipidemia LDL goal <100  3. Essential hypertension  4. Erectile dysfunction due to arterial insufficiency - tadalafil (CIALIS) 20 MG tablet; Take 1/2 -1 tab p.o. 30 mins prior to intercourse.  Dispense: 10 tablet; Refill: 0  5. Chronic low back pain without sciatica, unspecified back pain laterality - traMADol (ULTRAM) 50 MG tablet; Take one tablet qd prn pain  Dispense: 30 tablet; Refill: 5  6. Insomnia, unspecified type - LORazepam (ATIVAN) 0.5 MG tablet; Take 1 tablet (0.5 mg total) by mouth at bedtime.  Dispense: 30 tablet; Refill: 2  7. Anemia, unspecified type   DM2- recheck labs. Cont meds. hld- stable, cont meds. Check labs. HTN- stable, cont meds. Insomnia- will decrease from 41m to 0.529mqhs for insomnia prn.   Reviewed risk vs benefit of being on ativan long periods of time. Pt in agreement with trying to taper down.  Pt wanting to try different med for ED.  Was on revatio 2-3 tab prn stating not working. Not tried other meds. Will give trial of cialis 1/2-1 tab 20109mab prn.  Anxiety-stable, improved. Pt wanting to taper off lexapro, gave taper instructions.  Will refill other meds after getting labs today.  F/u [id number].

## 2020-06-11 NOTE — Progress Notes (Signed)
error 

## 2020-06-11 NOTE — Patient Instructions (Signed)
Take 1/2 tablet lexapro (esitalopram) 5mg  in AM for 7 days, then take 1/2 tab every other day for 3 doses, then stop. Take 1/2 tablet at night of ativan as needed for sleep.

## 2020-06-12 LAB — CBC
Hematocrit: 40.8 % (ref 37.5–51.0)
Hemoglobin: 13.2 g/dL (ref 13.0–17.7)
MCH: 28.3 pg (ref 26.6–33.0)
MCHC: 32.4 g/dL (ref 31.5–35.7)
MCV: 88 fL (ref 79–97)
Platelets: 197 10*3/uL (ref 150–450)
RBC: 4.66 x10E6/uL (ref 4.14–5.80)
RDW: 14.6 % (ref 11.6–15.4)
WBC: 9.4 10*3/uL (ref 3.4–10.8)

## 2020-06-12 LAB — CMP14+EGFR
ALT: 13 IU/L (ref 0–44)
AST: 12 IU/L (ref 0–40)
Albumin/Globulin Ratio: 1.9 (ref 1.2–2.2)
Albumin: 4.4 g/dL (ref 3.8–4.9)
Alkaline Phosphatase: 76 IU/L (ref 44–121)
BUN/Creatinine Ratio: 16 (ref 10–24)
BUN: 15 mg/dL (ref 8–27)
Bilirubin Total: 1.1 mg/dL (ref 0.0–1.2)
CO2: 24 mmol/L (ref 20–29)
Calcium: 9.8 mg/dL (ref 8.6–10.2)
Chloride: 101 mmol/L (ref 96–106)
Creatinine, Ser: 0.94 mg/dL (ref 0.76–1.27)
GFR calc Af Amer: 101 mL/min/{1.73_m2} (ref >59)
GFR calc non Af Amer: 88 mL/min/{1.73_m2} (ref >59)
Globulin, Total: 2.3 g/dL (ref 1.5–4.5)
Glucose: 135 mg/dL — ABNORMAL HIGH (ref 65–99)
Potassium: 4.3 mmol/L (ref 3.5–5.2)
Sodium: 139 mmol/L (ref 134–144)
Total Protein: 6.7 g/dL (ref 6.0–8.5)

## 2020-06-12 LAB — LIPID PANEL
Chol/HDL Ratio: 3.2 ratio (ref 0.0–5.0)
Cholesterol, Total: 123 mg/dL (ref 100–199)
HDL: 38 mg/dL — ABNORMAL LOW (ref >39)
LDL Chol Calc (NIH): 59 mg/dL (ref 0–99)
Triglycerides: 153 mg/dL — ABNORMAL HIGH (ref 0–149)
VLDL Cholesterol Cal: 26 mg/dL (ref 5–40)

## 2020-06-12 LAB — URINALYSIS
Bilirubin, UA: NEGATIVE
Glucose, UA: NEGATIVE
Ketones, UA: NEGATIVE
Leukocytes,UA: NEGATIVE
Nitrite, UA: NEGATIVE
Protein,UA: NEGATIVE
RBC, UA: NEGATIVE
Specific Gravity, UA: 1.019 (ref 1.005–1.030)
Urobilinogen, Ur: 0.2 mg/dL (ref 0.2–1.0)
pH, UA: 6 (ref 5.0–7.5)

## 2020-06-12 LAB — MICROALBUMIN / CREATININE URINE RATIO
Creatinine, Urine: 104.2 mg/dL
Microalb/Creat Ratio: <3 mg/g creat (ref 0–29)
Microalbumin, Urine: <3 ug/mL

## 2020-06-15 LAB — SPECIMEN STATUS REPORT

## 2020-06-18 DIAGNOSIS — B351 Tinea unguium: Secondary | ICD-10-CM | POA: Diagnosis not present

## 2020-06-18 LAB — HGB A1C W/O EAG: Hgb A1c MFr Bld: 7.2 % — ABNORMAL HIGH (ref 4.8–5.6)

## 2020-06-18 LAB — SPECIMEN STATUS REPORT

## 2020-07-04 ENCOUNTER — Other Ambulatory Visit: Payer: Self-pay | Admitting: Family Medicine

## 2020-07-19 ENCOUNTER — Other Ambulatory Visit (INDEPENDENT_AMBULATORY_CARE_PROVIDER_SITE_OTHER): Payer: BC Managed Care – PPO | Admitting: *Deleted

## 2020-07-19 ENCOUNTER — Other Ambulatory Visit: Payer: Self-pay

## 2020-07-19 DIAGNOSIS — Z23 Encounter for immunization: Secondary | ICD-10-CM | POA: Diagnosis not present

## 2020-07-19 DIAGNOSIS — Z20828 Contact with and (suspected) exposure to other viral communicable diseases: Secondary | ICD-10-CM | POA: Diagnosis not present

## 2020-07-24 ENCOUNTER — Other Ambulatory Visit: Payer: Self-pay | Admitting: Family Medicine

## 2020-07-29 ENCOUNTER — Other Ambulatory Visit: Payer: Self-pay | Admitting: Family Medicine

## 2020-08-14 ENCOUNTER — Other Ambulatory Visit: Payer: Self-pay | Admitting: *Deleted

## 2020-08-14 ENCOUNTER — Telehealth: Payer: Self-pay | Admitting: Family Medicine

## 2020-08-14 DIAGNOSIS — M545 Low back pain, unspecified: Secondary | ICD-10-CM

## 2020-08-14 DIAGNOSIS — G8929 Other chronic pain: Secondary | ICD-10-CM

## 2020-08-14 MED ORDER — GLIPIZIDE 5 MG PO TABS: ORAL_TABLET | ORAL | 2 refills | Status: DC

## 2020-08-14 MED ORDER — TRAMADOL HCL 50 MG PO TABS: ORAL_TABLET | ORAL | 3 refills | Status: DC

## 2020-08-14 NOTE — Addendum Note (Signed)
Addended by: [name] on: 08/14/2020 02:37 PM   Modules accepted: Orders

## 2020-08-14 NOTE — Telephone Encounter (Signed)
Refills sent to pharmacy. 

## 2020-08-14 NOTE — Telephone Encounter (Signed)
Yes, pls sent tramadol 50mg  q12 hrs prn pain, #60 with 3 refills. Thx. Dr. 

## 2020-08-14 NOTE — Telephone Encounter (Signed)
Pharmacy contacted. Computer states no refills. May we send in refills? Please advise. Thank you

## 2020-08-14 NOTE — Telephone Encounter (Signed)
Pharmacy [facility]) requesting refill on Tramadol 50 mg tablet. Take one tablet po every day prn pain. Pt last seen 06/11/20 for DM. Please advise. Thank you

## 2020-08-14 NOTE — Telephone Encounter (Signed)
Last script had 5 refills on it.  Did the pharmacy get it?  Thx. Dr. Ladona Ridgel

## 2020-08-27 ENCOUNTER — Other Ambulatory Visit: Payer: Self-pay | Admitting: Family Medicine

## 2020-08-27 DIAGNOSIS — B351 Tinea unguium: Secondary | ICD-10-CM | POA: Diagnosis not present

## 2020-09-26 ENCOUNTER — Other Ambulatory Visit: Payer: Self-pay | Admitting: Family Medicine

## 2020-09-26 DIAGNOSIS — G4700 Insomnia, unspecified: Secondary | ICD-10-CM

## 2020-09-27 NOTE — Telephone Encounter (Signed)
Last med check up 06/11/20

## 2020-10-19 ENCOUNTER — Other Ambulatory Visit: Payer: Self-pay | Admitting: Family Medicine

## 2020-11-12 ENCOUNTER — Other Ambulatory Visit: Payer: Self-pay | Admitting: Family Medicine

## 2020-12-10 ENCOUNTER — Other Ambulatory Visit: Payer: Self-pay

## 2020-12-10 ENCOUNTER — Ambulatory Visit: Payer: BC Managed Care – PPO | Admitting: Family Medicine

## 2020-12-10 ENCOUNTER — Encounter: Payer: Self-pay | Admitting: Family Medicine

## 2020-12-10 ENCOUNTER — Ambulatory Visit (INDEPENDENT_AMBULATORY_CARE_PROVIDER_SITE_OTHER): Payer: BC Managed Care – PPO | Admitting: Family Medicine

## 2020-12-10 VITALS — BP 104/72 | HR 90 | Temp 97.9°F | Resp 16

## 2020-12-10 DIAGNOSIS — R059 Cough, unspecified: Secondary | ICD-10-CM

## 2020-12-10 DIAGNOSIS — J301 Allergic rhinitis due to pollen: Secondary | ICD-10-CM | POA: Diagnosis not present

## 2020-12-10 DIAGNOSIS — H66001 Acute suppurative otitis media without spontaneous rupture of ear drum, right ear: Secondary | ICD-10-CM

## 2020-12-10 MED ORDER — CETIRIZINE HCL 10 MG PO TABS: 10.0000 mg | ORAL_TABLET | Freq: Every day | ORAL | 11 refills | Status: DC

## 2020-12-10 MED ORDER — AMOXICILLIN-POT CLAVULANATE 875-125 MG PO TABS: 1.0000 | ORAL_TABLET | Freq: Two times a day (BID) | ORAL | 0 refills | Status: DC

## 2020-12-10 NOTE — Patient Instructions (Signed)
Check blood sugars and blood pressures daily while you are sick. Follow-up with Dr. Ladona Ridgel if the dizziness does not resolve with antibiotic for ear infection. Let us know if your blood pressure stays < 120/70.      Otitis Media, Adult  Otitis media is a condition in which the middle ear is red and swollen (inflamed) and full of fluid. The middle ear is the part of the ear that contains bones for hearing as well as air that helps send sounds to the brain. The condition usually goes away on its own. What are the causes? This condition is caused by a blockage in the eustachian tube. The eustachian tube connects the middle ear to the back of the nose. It normally allows air into the middle ear. The blockage is caused by fluid or swelling. Problems that can cause blockage include:  A cold or infection that affects the nose, mouth, or throat.  Allergies.  An irritant, such as tobacco smoke.  Adenoids that have become large. The adenoids are soft tissue located in the back of the throat, behind the nose and the roof of the mouth.  Growth or swelling in the upper part of the throat, just behind the nose (nasopharynx).  Damage to the ear caused by change in pressure. This is called barotrauma. What are the signs or symptoms? Symptoms of this condition include:  Ear pain.  Fever.  Problems with hearing.  Being tired.  Fluid leaking from the ear.  Ringing in the ear. How is this treated? This condition can go away on its own within 3-5 days. But if the condition is caused by bacteria or does not go away on its own, or if it keeps coming back, your doctor may:  Give you antibiotic medicines.  Give you medicines for pain. Follow these instructions at home:  Take over-the-counter and prescription medicines only as told by your doctor.  If you were prescribed an antibiotic medicine, take it as told by your doctor. Do not stop taking the antibiotic even if you start to feel  better.  Keep all follow-up visits as told by your doctor. This is important. Contact a doctor if:  You have bleeding from your nose.  There is a lump on your neck.  You are not feeling better in 5 days.  You feel worse instead of better. Get help right away if:  You have pain that is not helped with medicine.  You have swelling, redness, or pain around your ear.  You get a stiff neck.  You cannot move part of your face (paralysis).  You notice that the bone behind your ear hurts when you touch it.  You get a very bad headache. Summary  Otitis media means that the middle ear is red, swollen, and full of fluid.  This condition usually goes away on its own.  If the problem does not go away, treatment may be needed. You may be given medicines to treat the infection or to treat your pain.  If you were prescribed an antibiotic medicine, take it as told by your doctor. Do not stop taking the antibiotic even if you start to feel better.  Keep all follow-up visits as told by your doctor. This is important. This information is not intended to replace advice given to you by your health care provider. Make sure you discuss any questions you have with your health care provider. Document Revised: 08/10/2019 Document Reviewed: 08/10/2019 Elsevier Patient Education  2021 [employer].

## 2020-12-10 NOTE — Progress Notes (Signed)
[name], male    DOB: 06-12-1961, 60 y.o.   MRN: [id number]   Chief Complaint  Patient presents with  . Cough    Congestion since Sunday   Subjective:  CC: cough, congestion, dizziness  This is a new problem.  Presents today for an acute visit with a complaint of cough, congestion and dizziness.  Symptoms started on Sunday.  Reports that he does not routinely check blood sugar, checked it today it was 200.  Has not checked his blood pressure.  Reports that the dizziness is worse with activity much better today.  Denies fever, chills and fatigue.    Medical History Diante has a past medical history of Anxiety, Arthritis, Diabetes mellitus without complication ([place]), Diverticulosis, ED (erectile dysfunction), Fatty liver, GERD (gastroesophageal reflux disease), History of COVID-19 (07/2019), History of left inguinal hernia, History of retinal detachment, Hyperlipidemia, Hypertension, Insomnia, Low ferritin level, Numbness and tingling of both feet, and Shortness of breath dyspnea.   Outpatient Encounter Medications as of 12/10/2020  Medication Sig  . amoxicillin-clavulanate (AUGMENTIN) 875-125 MG tablet Take 1 tablet by mouth 2 (two) times daily.  . cetirizine (ZYRTEC) 10 MG tablet Take 1 tablet (10 mg total) by mouth daily.  Marland Kitchen atorvastatin (LIPITOR) 20 MG tablet TAKE 1 TABLET BY MOUTH AT BEDTIME.  . blood glucose meter kit and supplies KIT Dispense based on patient and insurance preference. Test sugar up to one time a day  . dutasteride (AVODART) 0.5 MG capsule TAKE 1 CAPSULE BY MOUTH ONCE DAILY.  . fluticasone (FLONASE) 50 MCG/ACT nasal spray USE 2 SPRAYS IN EACH NOSTRIL EVERY DAY, AS NEEDED. (Patient taking differently: Place 2 sprays into both nostrils daily as needed (congestion.). )  . gabapentin (NEURONTIN) 300 MG capsule Take 1 capsule (300 mg total) by mouth 3 (three) times daily. Take a 300 mg capsule three times a day for two weeks following surgery.Then take a  300 mg capsule two times a day for two weeks. Then take a 300 mg capsule once a day for two weeks. Then discontinue.  Marland Kitchen glipiZIDE (GLUCOTROL) 5 MG tablet TAKE 2 TABLETS BY MOUTH TWICE DAILY BEFORE A MEAL  . LORazepam (ATIVAN) 0.5 MG tablet TAKE 1 TABLET BY MOUTH AT BEDTIME.  Marland Kitchen losartan (COZAAR) 50 MG tablet TAKE 1 TABLET BY MOUTH ONCE DAILY.  . metFORMIN (GLUCOPHAGE) 500 MG tablet TAKE 2 TABLETS BY MOUTH TWICE DAILY.  Marland Kitchen omeprazole (PRILOSEC) 20 MG capsule TAKE 1 CAPSULE 2 TIMES A DAY BEFORE MEALS.  Marland Kitchen ONETOUCH DELICA LANCETS 86P MISC   . ONETOUCH VERIO test strip   . tadalafil (CIALIS) 20 MG tablet Take 1/2 -1 tab p.o. 30 mins prior to intercourse.  . tamsulosin (FLOMAX) 0.4 MG CAPS capsule TAKE 2 CAPSULES NIGHTLY.  . traMADol (ULTRAM) 50 MG tablet Take one tablet po q 12 hrs prn pain   No facility-administered encounter medications on file as of 12/10/2020.     Review of Systems  Constitutional: Negative for chills, fatigue and fever.  HENT: Positive for congestion.   Respiratory: Positive for cough.   Neurological: Positive for dizziness.     Vitals BP 104/72   Pulse 90   Temp 97.9 F (36.6 C)   Resp 16   SpO2 98%   Objective:   Physical Exam Vitals reviewed.  Constitutional:      General: He is not in acute distress.    Appearance: He is not ill-appearing.  HENT:     Right Ear: Tympanic  membrane is erythematous.     Left Ear: Tympanic membrane normal.     Nose:     Right Turbinates: Swollen.     Left Turbinates: Swollen.     Right Sinus: No maxillary sinus tenderness or frontal sinus tenderness.     Left Sinus: No maxillary sinus tenderness or frontal sinus tenderness.     Mouth/Throat:     Mouth: Mucous membranes are moist.  Cardiovascular:     Rate and Rhythm: Normal rate and regular rhythm.     Heart sounds: Normal heart sounds.  Pulmonary:     Effort: Pulmonary effort is normal.     Breath sounds: Normal breath sounds.  Skin:    General: Skin is warm and  dry.  Neurological:     General: No focal deficit present.     Mental Status: He is alert.  Psychiatric:        Behavior: Behavior normal.      Assessment and Plan   1. Non-recurrent acute suppurative otitis media of right ear without spontaneous rupture of tympanic membrane - amoxicillin-clavulanate (AUGMENTIN) 875-125 MG tablet; Take 1 tablet by mouth 2 (two) times daily.  Dispense: 20 tablet; Refill: 0  2. Seasonal allergic rhinitis due to pollen - cetirizine (ZYRTEC) 10 MG tablet; Take 1 tablet (10 mg total) by mouth daily.  Dispense: 30 tablet; Refill: 11   Blood pressure lower in office today than previous visit.  He will buy a blood pressure cuff, keep an eye on this and let us know if his blood pressure consistently stays less than 120/70.  He may need his blood pressure medication adjusted while he is ill.  Encouraged to check his blood sugars daily while he is ill as well.  Right otitis media, will treat with antibiotics for 10 days.  Encouraged to start Zyrtec daily during pollen season.  Agrees with plan of care discussed today. Understands warning signs to seek further care: chest pain, shortness of breath, any significant change in health.  Understands to follow-up if symptoms do not improve, or worsen.  He has appointment to follow-up with PCP, Dr. Lovena Le for routine medication management soon.  Pecolia Ades, NP 12/10/20

## 2020-12-11 ENCOUNTER — Other Ambulatory Visit: Payer: Self-pay | Admitting: Family Medicine

## 2020-12-11 ENCOUNTER — Telehealth: Payer: Self-pay | Admitting: *Deleted

## 2020-12-11 LAB — SARS-COV-2, NAA 2 DAY TAT

## 2020-12-11 LAB — NOVEL CORONAVIRUS, NAA: SARS-CoV-2, NAA: NOT DETECTED

## 2020-12-11 LAB — SPECIMEN STATUS REPORT

## 2020-12-11 NOTE — Telephone Encounter (Signed)
Patient states he started the medication yesterday and is feeling better today- just little cough and little harse but feeling better and he has not checked his blood sugar or blood pressure but he is doing fine

## 2020-12-11 NOTE — Telephone Encounter (Signed)
Per Clydie Braun NP:  Please call and check on him today. I want to make sure he is monitoring his blood pressure and blood sugar while he is sick.

## 2020-12-12 ENCOUNTER — Telehealth: Payer: Self-pay | Admitting: Family Medicine

## 2020-12-12 NOTE — Telephone Encounter (Signed)
See result note. I called and spoke with pt.  

## 2020-12-12 NOTE — Telephone Encounter (Signed)
Returning a call to the nurse.

## 2020-12-19 ENCOUNTER — Ambulatory Visit: Payer: BC Managed Care – PPO | Admitting: Family Medicine

## 2020-12-19 DIAGNOSIS — Z96652 Presence of left artificial knee joint: Secondary | ICD-10-CM | POA: Diagnosis not present

## 2020-12-20 ENCOUNTER — Encounter: Payer: Self-pay | Admitting: Family Medicine

## 2020-12-20 ENCOUNTER — Ambulatory Visit: Payer: BC Managed Care – PPO | Admitting: Family Medicine

## 2020-12-20 ENCOUNTER — Other Ambulatory Visit: Payer: Self-pay

## 2020-12-20 VITALS — BP 109/80 | HR 87 | Temp 97.7°F | Ht 73.0 in | Wt 218.0 lb

## 2020-12-20 DIAGNOSIS — R5383 Other fatigue: Secondary | ICD-10-CM | POA: Diagnosis not present

## 2020-12-20 DIAGNOSIS — I10 Essential (primary) hypertension: Secondary | ICD-10-CM | POA: Diagnosis not present

## 2020-12-20 DIAGNOSIS — E119 Type 2 diabetes mellitus without complications: Secondary | ICD-10-CM

## 2020-12-20 DIAGNOSIS — N401 Enlarged prostate with lower urinary tract symptoms: Secondary | ICD-10-CM

## 2020-12-20 DIAGNOSIS — R351 Nocturia: Secondary | ICD-10-CM

## 2020-12-20 NOTE — Progress Notes (Addendum)
[name], male    DOB: Feb 14, 1961, 60 y.o.   MRN: [id number]   Chief Complaint  Patient presents with  . Diabetes   Subjective:    HPI  F/u diabetes.   Concerns about fatigue and wanting thyroid checked.   Went to Dr. Reynaldo [name] about the knee. Had knee surgery. Pt had coughing last week, but resolved now.  Feeling more weak in afternoon. Not feeling like exercising like he used to.  Lost about 40 lbs after covid, and pt has increased in weight now.  175-198 blood glucose. Check 4x last week. Not check bg daily. Last a1c -7.2, 9/21.  Prior to covid didn't feel like this.  Feeling his fatigue hasn't recovered.  Night time urination, going every 2 hrs. On dutaseteride 0.5 and 0.28m flomax. Not having some dribbling.  On max doses. Pt stating not ready to have prostate surgery.  Not wanting to see urology.  HTN Pt compliant with BP meds.  No SEs Denies chest pain, sob, LE swelling, or blurry vision.  Dm2- Compliant with medications. Checking blood glucose.   Not seeing any high or low numbers.  Denies polyuria or polydipsia.  Eye exam: overdue Foot exam: no concerns    Medical History RAmeerhas a past medical history of Anxiety, Arthritis, Diabetes mellitus without complication ([place], Diverticulosis, ED (erectile dysfunction), Fatty liver, GERD (gastroesophageal reflux disease), History of COVID-19 (07/2019), History of left inguinal hernia, History of retinal detachment, Hyperlipidemia, Hypertension, Insomnia, Low ferritin level, Numbness and tingling of both feet, and Shortness of breath dyspnea.   Outpatient Encounter Medications as of 12/20/2020  Medication Sig  . amoxicillin-clavulanate (AUGMENTIN) 875-125 MG tablet Take 1 tablet by mouth 2 (two) times daily.  .Marland Kitchenatorvastatin (LIPITOR) 20 MG tablet TAKE 1 TABLET BY MOUTH AT BEDTIME.  . blood glucose meter kit and supplies KIT Dispense based on patient and insurance preference. Test sugar up to  one time a day  . cetirizine (ZYRTEC) 10 MG tablet Take 1 tablet (10 mg total) by mouth daily.  .Marland Kitchendutasteride (AVODART) 0.5 MG capsule TAKE 1 CAPSULE BY MOUTH ONCE DAILY.  . fluticasone (FLONASE) 50 MCG/ACT nasal spray USE 2 SPRAYS IN EACH NOSTRIL EVERY DAY, AS NEEDED. (Patient taking differently: Place 2 sprays into both nostrils daily as needed (congestion.).)  . glipiZIDE (GLUCOTROL) 5 MG tablet TAKE 2 TABLETS BY MOUTH TWICE DAILY BEFORE A MEAL  . LORazepam (ATIVAN) 0.5 MG tablet TAKE 1 TABLET BY MOUTH AT BEDTIME.  .Marland Kitchenlosartan (COZAAR) 50 MG tablet TAKE 1 TABLET BY MOUTH ONCE DAILY.  . metFORMIN (GLUCOPHAGE) 500 MG tablet TAKE 2 TABLETS BY MOUTH TWICE DAILY.  .Marland Kitchenomeprazole (PRILOSEC) 20 MG capsule TAKE 1 CAPSULE 2 TIMES A DAY BEFORE MEALS.  .Marland KitchenONETOUCH DELICA LANCETS 327OMISC   . ONETOUCH VERIO test strip   . tamsulosin (FLOMAX) 0.4 MG CAPS capsule TAKE 2 CAPSULES NIGHTLY.  . traMADol (ULTRAM) 50 MG tablet Take one tablet po q 12 hrs prn pain  . tadalafil (CIALIS) 20 MG tablet Take 1/2 -1 tab p.o. 30 mins prior to intercourse. (Patient not taking: Reported on 12/20/2020)  . [DISCONTINUED] gabapentin (NEURONTIN) 300 MG capsule Take 1 capsule (300 mg total) by mouth 3 (three) times daily. Take a 300 mg capsule three times a day for two weeks following surgery.Then take a 300 mg capsule two times a day for two weeks. Then take a 300 mg capsule once a day for two weeks. Then discontinue.  No facility-administered encounter medications on file as of 12/20/2020.     Review of Systems  Constitutional: Negative for chills and fever.  HENT: Negative for congestion, rhinorrhea and sore throat.   Respiratory: Positive for cough (resolved). Negative for shortness of breath and wheezing.   Cardiovascular: Negative for chest pain and leg swelling.  Gastrointestinal: Negative for abdominal pain, diarrhea, nausea and vomiting.  Genitourinary: Negative for dysuria and frequency.       +nocturia  Skin:  Negative for rash.  Neurological: Negative for dizziness, weakness and headaches.     Vitals BP 109/80   Pulse 87   Temp 97.7 F (36.5 C)   Ht _0  (1.854 m)   Wt 218 lb (98.9 kg)   SpO2 97%   BMI 28.76 kg/m   Objective:   Physical Exam Vitals and nursing note reviewed.  Constitutional:      General: He is not in acute distress.    Appearance: Normal appearance. He is not ill-appearing.  HENT:     Head: Normocephalic.     Nose: Nose normal. No congestion.     Mouth/Throat:     Mouth: Mucous membranes are moist.     Pharynx: No oropharyngeal exudate.  Eyes:     Extraocular Movements: Extraocular movements intact.     Conjunctiva/sclera: Conjunctivae normal.     Pupils: Pupils are equal, round, and reactive to light.  Cardiovascular:     Rate and Rhythm: Normal rate and regular rhythm.     Pulses: Normal pulses.     Heart sounds: Normal heart sounds. No murmur heard.   Pulmonary:     Effort: Pulmonary effort is normal. No respiratory distress.     Breath sounds: Normal breath sounds. No wheezing, rhonchi or rales.  Musculoskeletal:        General: Normal range of motion.     Right lower leg: No edema.     Left lower leg: No edema.  Skin:    General: Skin is warm and dry.     Findings: No rash.  Neurological:     General: No focal deficit present.     Mental Status: He is alert and oriented to person, place, and time.     Cranial Nerves: No cranial nerve deficit.  Psychiatric:        Mood and Affect: Mood normal.        Behavior: Behavior normal.        Thought Content: Thought content normal.        Judgment: Judgment normal.      Assessment and Plan   1. Essential hypertension - CMP14+EGFR - CBC With Differential  2. Type 2 diabetes mellitus without complication, without long-term current use of insulin (HCC) - CMP14+EGFR - Hemoglobin A1c - Lipid panel - Microalbumin, urine - TSH  3. Other fatigue - TSH  4. Nocturia associated with benign  prostatic hyperplasia   dm2- stable. Cont meds.  Check labs.  htn- stable. Cont meds.  Fatigue- will check labs. Could me multifactorial with h/o covid and decondition with increase in weight gain. recommending multivitamin.  Needing fasting labs next week.  bph- not controlled with nocturia.  Pt declining seeing Urology, not wanting to have a procedure for prostate.  Pt already on max doses of medication for bph and nocturia.  Return in about 6 months (around 06/21/2021) for f/u dm2, htn.   12/20/2020   Addendum- dm2 uncontrolled on labs-adding jardiance 74m daily, since a1c at 9.5, discussed dec  carbs and inc exercising.

## 2020-12-20 NOTE — Patient Instructions (Addendum)
Generic centrum silver for seniors once a day.

## 2020-12-23 DIAGNOSIS — I10 Essential (primary) hypertension: Secondary | ICD-10-CM | POA: Diagnosis not present

## 2020-12-23 DIAGNOSIS — R5383 Other fatigue: Secondary | ICD-10-CM | POA: Diagnosis not present

## 2020-12-23 DIAGNOSIS — E119 Type 2 diabetes mellitus without complications: Secondary | ICD-10-CM | POA: Diagnosis not present

## 2020-12-24 ENCOUNTER — Other Ambulatory Visit: Payer: Self-pay | Admitting: Family Medicine

## 2020-12-24 DIAGNOSIS — G4700 Insomnia, unspecified: Secondary | ICD-10-CM

## 2020-12-24 LAB — CBC WITH DIFFERENTIAL
Basophils Absolute: 0 10*3/uL (ref 0.0–0.2)
Basos: 1 %
EOS (ABSOLUTE): 0.2 10*3/uL (ref 0.0–0.4)
Eos: 2 %
Hematocrit: 38 % (ref 37.5–51.0)
Hemoglobin: 12.1 g/dL — ABNORMAL LOW (ref 13.0–17.7)
Immature Grans (Abs): 0 10*3/uL (ref 0.0–0.1)
Immature Granulocytes: 0 %
Lymphocytes Absolute: 1.9 10*3/uL (ref 0.7–3.1)
Lymphs: 28 %
MCH: 25.7 pg — ABNORMAL LOW (ref 26.6–33.0)
MCHC: 31.8 g/dL (ref 31.5–35.7)
MCV: 81 fL (ref 79–97)
Monocytes Absolute: 0.4 10*3/uL (ref 0.1–0.9)
Monocytes: 6 %
Neutrophils Absolute: 4.3 10*3/uL (ref 1.4–7.0)
Neutrophils: 63 %
RBC: 4.7 x10E6/uL (ref 4.14–5.80)
RDW: 14.7 % (ref 11.6–15.4)
WBC: 6.8 10*3/uL (ref 3.4–10.8)

## 2020-12-24 LAB — LIPID PANEL
Chol/HDL Ratio: 3.7 ratio (ref 0.0–5.0)
Cholesterol, Total: 133 mg/dL (ref 100–199)
HDL: 36 mg/dL — ABNORMAL LOW (ref >39)
LDL Chol Calc (NIH): 65 mg/dL (ref 0–99)
Triglycerides: 194 mg/dL — ABNORMAL HIGH (ref 0–149)
VLDL Cholesterol Cal: 32 mg/dL (ref 5–40)

## 2020-12-24 LAB — CMP14+EGFR
ALT: 24 IU/L (ref 0–44)
AST: 23 IU/L (ref 0–40)
Albumin/Globulin Ratio: 1.6 (ref 1.2–2.2)
Albumin: 3.9 g/dL (ref 3.8–4.8)
Alkaline Phosphatase: 87 IU/L (ref 44–121)
BUN/Creatinine Ratio: 13 (ref 10–24)
BUN: 13 mg/dL (ref 8–27)
Bilirubin Total: 0.7 mg/dL (ref 0.0–1.2)
CO2: 24 mmol/L (ref 20–29)
Calcium: 9.7 mg/dL (ref 8.6–10.2)
Chloride: 100 mmol/L (ref 96–106)
Creatinine, Ser: 0.99 mg/dL (ref 0.76–1.27)
Globulin, Total: 2.5 g/dL (ref 1.5–4.5)
Glucose: 216 mg/dL — ABNORMAL HIGH (ref 65–99)
Potassium: 4.3 mmol/L (ref 3.5–5.2)
Sodium: 140 mmol/L (ref 134–144)
Total Protein: 6.4 g/dL (ref 6.0–8.5)
eGFR: 87 mL/min/{1.73_m2} (ref >59)

## 2020-12-24 LAB — HEMOGLOBIN A1C
Est. average glucose Bld gHb Est-mCnc: 226 mg/dL
Hgb A1c MFr Bld: 9.5 % — ABNORMAL HIGH (ref 4.8–5.6)

## 2020-12-24 LAB — TSH: TSH: 3.94 u[IU]/mL (ref 0.450–4.500)

## 2020-12-24 LAB — MICROALBUMIN, URINE: Microalbumin, Urine: 7 ug/mL

## 2020-12-24 MED ORDER — EMPAGLIFLOZIN 25 MG PO TABS
25.0000 mg | ORAL_TABLET | Freq: Every day | ORAL | 2 refills | Status: DC
Start: 2020-12-24 — End: 2021-01-09

## 2020-12-24 NOTE — Addendum Note (Signed)
Addended by: Annalee Genta on: 12/24/2020 09:02 AM   Modules accepted: Orders

## 2020-12-24 NOTE — Addendum Note (Signed)
Addended by: Margaretha Sheffield on: 12/24/2020 01:12 PM   Modules accepted: Orders

## 2020-12-25 ENCOUNTER — Other Ambulatory Visit: Payer: Self-pay | Admitting: Family Medicine

## 2020-12-25 ENCOUNTER — Telehealth: Payer: Self-pay | Admitting: Family Medicine

## 2020-12-25 DIAGNOSIS — E119 Type 2 diabetes mellitus without complications: Secondary | ICD-10-CM

## 2020-12-25 NOTE — Telephone Encounter (Signed)
Spoke with Pharmacist and they stated that they do have the script and the med but patient has a high deductible for his medications and his out of pocket for 30 day supply of jardiance is $ 550 but pharmacist states it will be no cheaper for any other meds in that category. Patient told pharmacist he could not afford that- please advise

## 2020-12-25 NOTE — Telephone Encounter (Signed)
Patient was prescribe a new medication for his diabetes on 4/1 visit but doesn't remember the name and was told by pharmacist that they were out. Can you prescribe a different medication. [facility]

## 2020-12-26 NOTE — Telephone Encounter (Signed)
Patient would like to proceed with referral to endocrinology- Referral ordered in [facility]

## 2020-12-26 NOTE — Telephone Encounter (Signed)
Ok thx.

## 2020-12-26 NOTE — Telephone Encounter (Signed)
Here are the options- due to his insurance issue.   #1- go to endocrinology and see if they can help you with samples or coupons and get a better regimen.   2#- take a once daily injection at night of lantus to keep sugars under control and get numbers back down to 7-8 a1c.   If pt can't control diet and increase exercising, he's heading toward insulin, bc this is the cheapest solution.  Let us know what he wants to do.   Doing nothing is going to get worse and have end organ damage with eyes, nerves, kidneys, and inc risk of cardiovascular event.  Dr. Ladona Ridgel

## 2021-01-08 NOTE — Patient Instructions (Signed)
Diabetes Mellitus and Nutrition, Adult When you have diabetes, or diabetes mellitus, it is very important to have healthy eating habits because your blood sugar (glucose) levels are greatly affected by what you eat and drink. Eating healthy foods in the right amounts, at about the same times every day, can help you:  Control your blood glucose.  Lower your risk of heart disease.  Improve your blood pressure.  Reach or maintain a healthy weight. What can affect my meal plan? Every person with diabetes is different, and each person has different needs for a meal plan. Your health care provider may recommend that you work with a dietitian to make a meal plan that is best for you. Your meal plan may vary depending on factors such as:  The calories you need.  The medicines you take.  Your weight.  Your blood glucose, blood pressure, and cholesterol levels.  Your activity level.  Other health conditions you have, such as heart or kidney disease. How do carbohydrates affect me? Carbohydrates, also called carbs, affect your blood glucose level more than any other type of food. Eating carbs naturally raises the amount of glucose in your blood. Carb counting is a method for keeping track of how many carbs you eat. Counting carbs is important to keep your blood glucose at a healthy level, especially if you use insulin or take certain oral diabetes medicines. It is important to know how many carbs you can safely have in each meal. This is different for every person. Your dietitian can help you calculate how many carbs you should have at each meal and for each snack. How does alcohol affect me? Alcohol can cause a sudden decrease in blood glucose (hypoglycemia), especially if you use insulin or take certain oral diabetes medicines. Hypoglycemia can be a life-threatening condition. Symptoms of hypoglycemia, such as sleepiness, dizziness, and confusion, are similar to symptoms of having too much  alcohol.  Do not drink alcohol if: ? Your health care provider tells you not to drink. ? You are pregnant, may be pregnant, or are planning to become pregnant.  If you drink alcohol: ? Do not drink on an empty stomach. ? Limit how much you use to:  0-1 drink a day for women.  0-2 drinks a day for men. ? Be aware of how much alcohol is in your drink. In the [place] one drink equals one 12 oz bottle of beer (355 mL), one 5 oz glass of wine (148 mL), or one 1 oz glass of hard liquor (44 mL). ? Keep yourself hydrated with water, diet soda, or unsweetened iced tea.  Keep in mind that regular soda, juice, and other mixers may contain a lot of sugar and must be counted as carbs. What are tips for following this plan? Reading food labels  Start by checking the serving size on the "Nutrition Facts" label of packaged foods and drinks. The amount of calories, carbs, fats, and other nutrients listed on the label is based on one serving of the item. Many items contain more than one serving per package.  Check the total grams (g) of carbs in one serving. You can calculate the number of servings of carbs in one serving by dividing the total carbs by 15. For example, if a food has 30 g of total carbs per serving, it would be equal to 2 servings of carbs.  Check the number of grams (g) of saturated fats and trans fats in one serving. Choose foods that have   a low amount or none of these fats.  Check the number of milligrams (mg) of salt (sodium) in one serving. Most people should limit total sodium intake to less than 2,300 mg per day.  Always check the nutrition information of foods labeled as "low-fat" or "nonfat." These foods may be higher in added sugar or refined carbs and should be avoided.  Talk to your dietitian to identify your daily goals for nutrients listed on the label. Shopping  Avoid buying canned, pre-made, or processed foods. These foods tend to be high in fat, sodium, and added  sugar.  Shop around the outside edge of the grocery store. This is where you will most often find fresh fruits and vegetables, bulk grains, fresh meats, and fresh dairy. Cooking  Use low-heat cooking methods, such as baking, instead of high-heat cooking methods like deep frying.  Cook using healthy oils, such as olive, canola, or sunflower oil.  Avoid cooking with butter, cream, or high-fat meats. Meal planning  Eat meals and snacks regularly, preferably at the same times every day. Avoid going long periods of time without eating.  Eat foods that are high in fiber, such as fresh fruits, vegetables, beans, and whole grains. Talk with your dietitian about how many servings of carbs you can eat at each meal.  Eat 4-6 oz (112-168 g) of lean protein each day, such as lean meat, chicken, fish, eggs, or tofu. One ounce (oz) of lean protein is equal to: ? 1 oz (28 g) of meat, chicken, or fish. ? 1 egg. ?  cup (62 g) of tofu.  Eat some foods each day that contain healthy fats, such as avocado, nuts, seeds, and fish.   What foods should I eat? Fruits Berries. Apples. Oranges. Peaches. Apricots. Plums. Grapes. Mango. Papaya. Pomegranate. Kiwi. Cherries. Vegetables Lettuce. Spinach. Leafy greens, including kale, chard, collard greens, and mustard greens. Beets. Cauliflower. Cabbage. Broccoli. Carrots. Green beans. Tomatoes. Peppers. Onions. Cucumbers. Brussels sprouts. Grains Whole grains, such as whole-wheat or whole-grain bread, crackers, tortillas, cereal, and pasta. Unsweetened oatmeal. Quinoa. Brown or wild rice. Meats and other proteins Seafood. Poultry without skin. Lean cuts of poultry and beef. Tofu. Nuts. Seeds. Dairy Low-fat or fat-free dairy products such as milk, yogurt, and cheese. The items listed above may not be a complete list of foods and beverages you can eat. Contact a dietitian for more information. What foods should I avoid? Fruits Fruits canned with  syrup. Vegetables Canned vegetables. Frozen vegetables with butter or cream sauce. Grains Refined white flour and flour products such as bread, pasta, snack foods, and cereals. Avoid all processed foods. Meats and other proteins Fatty cuts of meat. Poultry with skin. Breaded or fried meats. Processed meat. Avoid saturated fats. Dairy Full-fat yogurt, cheese, or milk. Beverages Sweetened drinks, such as soda or iced tea. The items listed above may not be a complete list of foods and beverages you should avoid. Contact a dietitian for more information. Questions to ask a health care provider  Do I need to meet with a diabetes educator?  Do I need to meet with a dietitian?  What number can I call if I have questions?  When are the best times to check my blood glucose? Where to find more information:  American Diabetes Association: diabetes.org  Academy of Nutrition and Dietetics: www.eatright.org  National Institute of Diabetes and Digestive and Kidney Diseases: www.niddk.nih.gov  Association of Diabetes Care and Education Specialists: www.diabeteseducator.org Summary  It is important to have healthy eating   habits because your blood sugar (glucose) levels are greatly affected by what you eat and drink.  A healthy meal plan will help you control your blood glucose and maintain a healthy lifestyle.  Your health care provider may recommend that you work with a dietitian to make a meal plan that is best for you.  Keep in mind that carbohydrates (carbs) and alcohol have immediate effects on your blood glucose levels. It is important to count carbs and to use alcohol carefully. This information is not intended to replace advice given to you by your health care provider. Make sure you discuss any questions you have with your health care provider. Document Revised: 08/15/2019 Document Reviewed: 08/15/2019 [employer] Patient Education  2021 [employer]  

## 2021-01-09 ENCOUNTER — Other Ambulatory Visit: Payer: Self-pay

## 2021-01-09 ENCOUNTER — Ambulatory Visit: Payer: BC Managed Care – PPO | Admitting: Nurse Practitioner

## 2021-01-09 ENCOUNTER — Encounter: Payer: Self-pay | Admitting: Nurse Practitioner

## 2021-01-09 VITALS — BP 115/78 | HR 78 | Ht 73.0 in | Wt 216.0 lb

## 2021-01-09 DIAGNOSIS — E119 Type 2 diabetes mellitus without complications: Secondary | ICD-10-CM | POA: Diagnosis not present

## 2021-01-09 DIAGNOSIS — E782 Mixed hyperlipidemia: Secondary | ICD-10-CM | POA: Diagnosis not present

## 2021-01-09 DIAGNOSIS — I10 Essential (primary) hypertension: Secondary | ICD-10-CM

## 2021-01-09 MED ORDER — GLIPIZIDE 5 MG PO TABS: 5.0000 mg | ORAL_TABLET | Freq: Two times a day (BID) | ORAL | 0 refills | Status: DC

## 2021-01-09 NOTE — Progress Notes (Signed)
Endocrinology Consult Note       01/09/2021, 11:01 AM   Subjective:    [name], male    DOB: 1960-11-26.  CHRLES SELLEY is being seen in consultation for management of currently uncontrolled symptomatic diabetes requested by  [name] Colla, DO.   Past Medical History:  Diagnosis Date  . Anxiety   . Arthritis   . Diabetes mellitus without complication ([place])   . Diabetes mellitus, type II ([place])   . Diverticulosis    pt unaware  . ED (erectile dysfunction)   . Fatty liver   . GERD (gastroesophageal reflux disease)   . History of COVID-19 07/2019  . History of left inguinal hernia   . History of retinal detachment    Right  . Hyperlipidemia   . Hypertension   . Insomnia   . Low ferritin level   . Numbness and tingling of both feet   . Shortness of breath dyspnea    since having COVID 07/2019 with exertions    Past Surgical History:  Procedure Laterality Date  . CHOLECYSTECTOMY    . COLONOSCOPY N/A 11/30/2014   Procedure: COLONOSCOPY;  Surgeon: Daneil Dolin, MD;  Location: [facility];  Service: Endoscopy;  Laterality: N/A;  7:30 Am  . ESOPHAGEAL DILATION    . HERNIA REPAIR     1981  . RETINAL DETACHMENT SURGERY Right   . TOTAL KNEE ARTHROPLASTY Left 12/11/2019   Procedure: TOTAL KNEE ARTHROPLASTY;  Surgeon: Gaynelle Arabian, MD;  Location: [facility];  Service: Orthopedics;  Laterality: Left;  15mn  . VASCULAR SURGERY      Social History   Socioeconomic History  . Marital status: Married    Spouse name: Not on file  . Number of children: Not on file  . Years of education: Not on file  . Highest education level: Not on file  Occupational History  . Not on file  Tobacco Use  . Smoking status: Never Smoker  . Smokeless tobacco: Never Used  Vaping Use  . Vaping Use: Never used  Substance and Sexual Activity  . Alcohol use: No  . Drug use: No  . Sexual activity: Never   Other Topics Concern  . Not on file  Social History Narrative  . Not on file   Social Determinants of Health   Financial Resource Strain: Not on file  Food Insecurity: Not on file  Transportation Needs: Not on file  Physical Activity: Not on file  Stress: Not on file  Social Connections: Not on file    History reviewed. No pertinent family history.  Outpatient Encounter Medications as of 01/09/2021  Medication Sig  . acetaminophen (TYLENOL) 500 MG tablet Take 500 mg by mouth every 6 (six) hours as needed.  .Marland Kitchenatorvastatin (LIPITOR) 20 MG tablet TAKE 1 TABLET BY MOUTH AT BEDTIME.  . blood glucose meter kit and supplies KIT Dispense based on patient and insurance preference. Test sugar up to one time a day  . dutasteride (AVODART) 0.5 MG capsule TAKE 1 CAPSULE BY MOUTH ONCE DAILY.  . fluticasone (FLONASE) 50 MCG/ACT nasal spray USE 2 SPRAYS IN EACH NOSTRIL EVERY DAY, AS NEEDED. (Patient taking  differently: Place 2 sprays into both nostrils daily as needed (congestion.).)  . LORazepam (ATIVAN) 0.5 MG tablet TAKE 1 TABLET BY MOUTH AT BEDTIME.  Marland Kitchen losartan (COZAAR) 50 MG tablet TAKE 1 TABLET BY MOUTH ONCE DAILY.  . metFORMIN (GLUCOPHAGE) 500 MG tablet TAKE 2 TABLETS BY MOUTH TWICE DAILY.  Marland Kitchen omeprazole (PRILOSEC) 20 MG capsule TAKE 1 CAPSULE 2 TIMES A DAY BEFORE MEALS.  Marland Kitchen ONETOUCH DELICA LANCETS 81R MISC   . ONETOUCH VERIO test strip   . tamsulosin (FLOMAX) 0.4 MG CAPS capsule TAKE 2 CAPSULES NIGHTLY.  . traMADol (ULTRAM) 50 MG tablet Take one tablet po q 12 hrs prn pain  . [DISCONTINUED] glipiZIDE (GLUCOTROL) 5 MG tablet TAKE 2 TABLETS BY MOUTH TWICE DAILY BEFORE A MEAL  . glipiZIDE (GLUCOTROL) 5 MG tablet Take 1 tablet (5 mg total) by mouth 2 (two) times daily before a meal.  . [DISCONTINUED] amoxicillin-clavulanate (AUGMENTIN) 875-125 MG tablet Take 1 tablet by mouth 2 (two) times daily.  . [DISCONTINUED] cetirizine (ZYRTEC) 10 MG tablet Take 1 tablet (10 mg total) by mouth daily.  .  [DISCONTINUED] empagliflozin (JARDIANCE) 25 MG TABS tablet Take 1 tablet (25 mg total) by mouth daily before breakfast.  . [DISCONTINUED] tadalafil (CIALIS) 20 MG tablet Take 1/2 -1 tab p.o. 30 mins prior to intercourse. (Patient not taking: Reported on 12/20/2020)   No facility-administered encounter medications on file as of 01/09/2021.    ALLERGIES: Allergies  Allergen Reactions  . Dilaudid [Hydromorphone Hcl] Other (See Comments)    PT SAID HE PASSED OUT WHEN HE TOOK IT    VACCINATION STATUS: Immunization History  Administered Date(s) Administered  . Influenza, Seasonal, Injecte, Preservative Fre 06/19/2016  . Influenza,inj,Quad PF,6+ Mos 06/17/2017, 06/20/2018, 07/12/2019, 07/19/2020    Diabetes He presents for his initial diabetic visit. He has type 2 diabetes mellitus. Onset time: Diagnosed at approx age of 49. His disease course has been worsening. There are no hypoglycemic associated symptoms. Associated symptoms include fatigue, foot paresthesias and polydipsia. There are no hypoglycemic complications. Symptoms are stable. Diabetic complications include impotence, nephropathy and peripheral neuropathy. Risk factors for coronary artery disease include diabetes mellitus, dyslipidemia, family history, male sex and hypertension. Current diabetic treatment includes oral agent (dual therapy). He is compliant with treatment most of the time. His weight is stable. He is following a generally unhealthy diet. When asked about meal planning, he reported none. He has not had a previous visit with a dietitian. He participates in exercise intermittently. His home blood glucose trend is decreasing steadily. His breakfast blood glucose range is generally 140-180 mg/dl. (He presents today for his consultation, accompanied by his wife, with no meter or logs to review.  His most recent A1c was 9.5% on 12/23/20, increasing from previous visit of 7.2%.  He had to have TKR last year and was started on Glipizide  prior to his surgery to make sure his diabetes did not interfere with his plans for surgery.  He admits to drinking sugary beverages such as tea and soda in addition to his water intake.  He also does not consume green veggies.  He eats 3 meals per day with snacks in between.  He does not engage in routine physical activity, says he is just too fatigued to get motivated to start again.  He denies any significant hypoglycemia, but does report an incidence where he dropped in the 80s after eating lunch when he was working in his barn.) An ACE inhibitor/angiotensin II receptor blocker is being taken.  He does not see a podiatrist.Eye exam is current.  Hyperlipidemia This is a chronic problem. The current episode started more than 1 year ago. The problem is uncontrolled. Recent lipid tests were reviewed and are variable. Exacerbating diseases include chronic renal disease, diabetes and liver disease. fatty liver. Factors aggravating his hyperlipidemia include fatty foods. Current antihyperlipidemic treatment includes statins. The current treatment provides mild improvement of lipids. Compliance problems include adherence to diet and adherence to exercise.  Risk factors for coronary artery disease include diabetes mellitus, dyslipidemia, family history, male sex, obesity and hypertension.  Hypertension This is a chronic problem. The current episode started more than 1 year ago. The problem has been gradually improving since onset. The problem is controlled. There are no associated agents to hypertension. Risk factors for coronary artery disease include dyslipidemia, diabetes mellitus, family history and male gender. Past treatments include angiotensin blockers. The current treatment provides moderate improvement. There are no compliance problems.  Hypertensive end-organ damage includes kidney disease. Identifiable causes of hypertension include chronic renal disease.     Review of systems  Constitutional: +  Minimally fluctuating body weight,  current Body mass index is 28.5 kg/m. , + fatigue, no subjective hyperthermia, no subjective hypothermia Eyes: no blurry vision, reports intermittent floaters (has not recently had eye exam), no xerophthalmia ENT: no sore throat, no nodules palpated in throat, no dysphagia/odynophagia, no hoarseness Cardiovascular: no chest pain, no shortness of breath, no palpitations, no leg swelling Respiratory: no cough, no shortness of breath Gastrointestinal: no nausea/vomiting/diarrhea Musculoskeletal: no muscle/joint aches Skin: no rashes, no hyperemia Neurological: no tremors, mild numbness/tingling to BLE, no dizziness Psychiatric: no depression, no anxiety  Objective:     BP 115/78 (BP Location: Right Arm, Patient Position: Sitting)   Pulse 78   Ht _0  (1.854 m)   Wt 216 lb (98 kg)   BMI 28.50 kg/m   Wt Readings from Last 3 Encounters:  01/09/21 216 lb (98 kg)  12/20/20 218 lb (98.9 kg)  06/11/20 205 lb (93 kg)     BP Readings from Last 3 Encounters:  01/09/21 115/78  12/20/20 109/80  12/10/20 104/72     Physical Exam- Limited  Constitutional:  Body mass index is 28.5 kg/m. , not in acute distress, normal state of mind Eyes:  EOMI, no exophthalmos Neck: Supple Cardiovascular: RRR, no murmers, rubs, or gallops, no edema Respiratory: Adequate breathing efforts, no crackles, rales, rhonchi, or wheezing Musculoskeletal: no gross deformities, strength intact in all four extremities, no gross restriction of joint movements Skin:  no rashes, no hyperemia Neurological: no tremor with outstretched hands    CMP ( most recent) CMP     Component Value Date/Time   NA 140 12/23/2020 0837   K 4.3 12/23/2020 0837   CL 100 12/23/2020 0837   CO2 24 12/23/2020 0837   GLUCOSE 216 (H) 12/23/2020 0837   GLUCOSE 161 (H) 12/12/2019 0302   BUN 13 12/23/2020 0837   CREATININE 0.99 12/23/2020 0837   CREATININE 0.78 04/18/2014 0719   CALCIUM 9.7  12/23/2020 0837   PROT 6.4 12/23/2020 0837   ALBUMIN 3.9 12/23/2020 0837   AST 23 12/23/2020 0837   ALT 24 12/23/2020 0837   ALKPHOS 87 12/23/2020 0837   BILITOT 0.7 12/23/2020 0837   GFRNONAA 88 06/11/2020 1204   GFRAA 101 06/11/2020 1204     Diabetic Labs (most recent): Lab Results  Component Value Date   HGBA1C 9.5 (H) 12/23/2020   HGBA1C 7.2 (H) 06/11/2020  HGBA1C 6.3 (A) 02/07/2020     Lipid Panel ( most recent) Lipid Panel     Component Value Date/Time   CHOL 133 12/23/2020 0837   TRIG 194 (H) 12/23/2020 0837   HDL 36 (L) 12/23/2020 0837   CHOLHDL 3.7 12/23/2020 0837   CHOLHDL 5.2 04/18/2014 0719   VLDL 41 (H) 04/18/2014 0719   LDLCALC 65 12/23/2020 0837   LABVLDL 32 12/23/2020 0837      Lab Results  Component Value Date   TSH 3.940 12/23/2020           Assessment & Plan:   1) Type 2 diabetes mellitus without complication, without long-term current use of insulin ([place])  He presents today for his consultation, accompanied by his wife, with no meter or logs to review.  His most recent A1c was 9.5% on 12/23/20, increasing from previous visit of 7.2%.  He had to have TKR last year and was started on Glipizide prior to his surgery to make sure his diabetes did not interfere with his plans for surgery.  He admits to drinking sugary beverages such as tea and soda in addition to his water intake.  He also does not consume green veggies.  He eats 3 meals per day with snacks in between.  He does not engage in routine physical activity, says he is just too fatigued to get motivated to start again.  He denies any significant hypoglycemia, but does report an incidence where he dropped in the 80s after eating lunch when he was working in his barn.  - [name] [name] has currently uncontrolled symptomatic type 2 DM since 60 years of age, with most recent A1c of 9.5 %.   -Recent labs reviewed.  - I had a long discussion with him about the progressive nature of diabetes and  the pathology behind its complications. -his diabetes is complicated by neuropathy and mild CKD and he remains at a high risk for more acute and chronic complications which include CAD, CVA, CKD, retinopathy, and neuropathy. These are all discussed in detail with him.  - I have counseled him on diet  and weight management by adopting a carbohydrate restricted/protein rich diet. Patient is encouraged to switch to unprocessed or minimally processed complex starch and increased protein intake (animal or plant source), fruits, and vegetables. -  he is advised to stick to a routine mealtimes to eat 3 meals a day and avoid unnecessary snacks (to snack only to correct hypoglycemia).   - he acknowledges that there is a room for improvement in his food and drink choices. - Suggestion is made for him to avoid simple carbohydrates  from his diet including Cakes, Sweet Desserts, Ice Cream, Soda (diet and regular), Sweet Tea, Candies, Chips, Cookies, Store Bought Juices, Alcohol in Excess of  1-2 drinks a day, Artificial Sweeteners, Coffee Creamer, and "Sugar-free" Products. This will help patient to have more stable blood glucose profile and potentially avoid unintended weight gain.  - he will be scheduled with Jearld Fenton, RDN, CDE for diabetes education.  - I have approached him with the following individualized plan to manage  his diabetes and patient agrees:   -he is encouraged to start monitoring glucose 4 times daily, before meals and before bed, to log their readings on the clinic sheets provided, and bring them to review at follow up appointment in 2 weeks.  - he is warned not to take insulin without proper monitoring per orders. - Adjustment parameters are given to him for  hypo and hyperglycemia in writing. - he is encouraged to call clinic for blood glucose levels less than 70 or above 300 mg /dl. - he is advised to continue Metformin 1000 mg po twice daily with meals, therapeutically suitable for  patient .  I advised him to reduce his dose of Glipizide to 5 mg po twice daily (cutting his current tab in half until he depletes his supply).  - he will be considered for incretin therapy as appropriate next visit.  - Specific targets for  A1c;  LDL, HDL,  and Triglycerides were discussed with the patient.  2) Blood Pressure /Hypertension:  his blood pressure is controlled to target.   he is advised to continue his current medications including Losartan 50 mg p.o. daily with breakfast.  3) Lipids/Hyperlipidemia:    Review of his recent lipid panel from 12/23/20 showed controlled  LDL at 65 and elevated triglycerides of 194 .  he  is advised to continue Lipitor 20 mg daily at bedtime.  Side effects and precautions discussed with him.  He is also encouraged to avoid fried foods and butter.  4)  Weight/Diet:  his Body mass index is 28.5 kg/m.  -   he is a candidate for some weight loss. I discussed with him the fact that loss of 5 - 10% of his  current body weight will have the most impact on his diabetes management.  Exercise, and detailed carbohydrates information provided  -  detailed on discharge instructions.  5) Chronic Care/Health Maintenance: -he is on ACEI/ARB and Statin medications and is encouraged to initiate and continue to follow up with Ophthalmology, Dentist, Podiatrist at least yearly or according to recommendations, and advised to stay away from smoking. I have recommended yearly flu vaccine and pneumonia vaccine at least every 5 years; moderate intensity exercise for up to 150 minutes weekly; and sleep for at least 7 hours a day.  - he is advised to maintain close follow up with [name] Colla, DO for primary care needs, as well as his other providers for optimal and coordinated care.   - Time spent in this patient care: 60 min, of which > 50% was spent in counseling him about his diabetes and the rest reviewing his blood glucose logs, discussing his hypoglycemia and  hyperglycemia episodes, reviewing his current and previous labs/studies (including abstraction from other facilities) and medications doses and developing a long term treatment plan based on the latest standards of care/guidelines; and documenting his care.    Please refer to Patient Instructions for Blood Glucose Monitoring and Insulin/Medications Dosing Guide" in media tab for additional information. Please also refer to "Patient Self Inventory" in the Media tab for reviewed elements of pertinent patient history.  Micah Noel participated in the discussions, expressed understanding, and voiced agreement with the above plans.  All questions were answered to his satisfaction. he is encouraged to contact clinic should he have any questions or concerns prior to his return visit.   Follow up plan: - Return in about 2 weeks (around 01/23/2021) for Diabetes follow up, Bring glucometer and logs, ABI next visit.  Rayetta Pigg, [facility] [address] [place], [place] [address] Phone: [phone] Fax: [phone]  01/09/2021, 11:01 AM

## 2021-01-10 ENCOUNTER — Other Ambulatory Visit: Payer: Self-pay | Admitting: Family Medicine

## 2021-01-23 ENCOUNTER — Ambulatory Visit: Payer: BC Managed Care – PPO | Admitting: Nurse Practitioner

## 2021-01-23 ENCOUNTER — Other Ambulatory Visit: Payer: Self-pay | Admitting: Family Medicine

## 2021-01-23 ENCOUNTER — Encounter: Payer: Self-pay | Admitting: Nurse Practitioner

## 2021-01-23 ENCOUNTER — Other Ambulatory Visit: Payer: Self-pay

## 2021-01-23 VITALS — BP 130/79 | HR 60 | Ht 73.0 in | Wt 207.0 lb

## 2021-01-23 DIAGNOSIS — R6889 Other general symptoms and signs: Secondary | ICD-10-CM | POA: Diagnosis not present

## 2021-01-23 DIAGNOSIS — I10 Essential (primary) hypertension: Secondary | ICD-10-CM | POA: Diagnosis not present

## 2021-01-23 DIAGNOSIS — E782 Mixed hyperlipidemia: Secondary | ICD-10-CM | POA: Diagnosis not present

## 2021-01-23 DIAGNOSIS — E119 Type 2 diabetes mellitus without complications: Secondary | ICD-10-CM | POA: Diagnosis not present

## 2021-01-23 NOTE — Patient Instructions (Signed)

## 2021-01-23 NOTE — Progress Notes (Signed)
Endocrinology Follow Up Note       01/23/2021, 9:05 AM   Subjective:    [name], male    DOB: 1961/08/22.  [name] [name] is being seen in follow up after being seen in consultation for management of currently uncontrolled symptomatic diabetes requested by  Erven Colla, DO.   Past Medical History:  Diagnosis Date  . Anxiety   . Arthritis   . Diabetes mellitus without complication ([place])   . Diabetes mellitus, type II ([place])   . Diverticulosis    pt unaware  . ED (erectile dysfunction)   . Fatty liver   . GERD (gastroesophageal reflux disease)   . History of COVID-19 07/2019  . History of left inguinal hernia   . History of retinal detachment    Right  . Hyperlipidemia   . Hypertension   . Insomnia   . Low ferritin level   . Numbness and tingling of both feet   . Shortness of breath dyspnea    since having COVID 07/2019 with exertions    Past Surgical History:  Procedure Laterality Date  . CHOLECYSTECTOMY    . COLONOSCOPY N/A 11/30/2014   Procedure: COLONOSCOPY;  Surgeon: Daneil Dolin, MD;  Location: [facility];  Service: Endoscopy;  Laterality: N/A;  7:30 Am  . ESOPHAGEAL DILATION    . HERNIA REPAIR     1981  . RETINAL DETACHMENT SURGERY Right   . TOTAL KNEE ARTHROPLASTY Left 12/11/2019   Procedure: TOTAL KNEE ARTHROPLASTY;  Surgeon: Gaynelle Arabian, MD;  Location: [facility];  Service: Orthopedics;  Laterality: Left;  44mn  . VASCULAR SURGERY      Social History   Socioeconomic History  . Marital status: Married    Spouse name: Not on file  . Number of children: Not on file  . Years of education: Not on file  . Highest education level: Not on file  Occupational History  . Not on file  Tobacco Use  . Smoking status: Never Smoker  . Smokeless tobacco: Never Used  Vaping Use  . Vaping Use: Never used  Substance and Sexual Activity  . Alcohol use: No  . Drug use: No   . Sexual activity: Never  Other Topics Concern  . Not on file  Social History Narrative  . Not on file   Social Determinants of Health   Financial Resource Strain: Not on file  Food Insecurity: Not on file  Transportation Needs: Not on file  Physical Activity: Not on file  Stress: Not on file  Social Connections: Not on file    History reviewed. No pertinent family history.  Outpatient Encounter Medications as of 01/23/2021  Medication Sig  . acetaminophen (TYLENOL) 500 MG tablet Take 500 mg by mouth every 6 (six) hours as needed.  .Marland Kitchenatorvastatin (LIPITOR) 20 MG tablet TAKE 1 TABLET BY MOUTH AT BEDTIME.  . blood glucose meter kit and supplies KIT Dispense based on patient and insurance preference. Test sugar up to one time a day  . CONTOUR NEXT TEST test strip USE TO TEST BLOOD SUGAR ONCE DAILY.  .Marland Kitchendutasteride (AVODART) 0.5 MG capsule TAKE 1 CAPSULE BY MOUTH ONCE  DAILY.  . fluticasone (FLONASE) 50 MCG/ACT nasal spray USE 2 SPRAYS IN EACH NOSTRIL EVERY DAY, AS NEEDED. (Patient taking differently: Place 2 sprays into both nostrils daily as needed (congestion.).)  . glipiZIDE (GLUCOTROL) 5 MG tablet Take 1 tablet (5 mg total) by mouth 2 (two) times daily before a meal.  . LORazepam (ATIVAN) 0.5 MG tablet TAKE 1 TABLET BY MOUTH AT BEDTIME.  Marland Kitchen losartan (COZAAR) 50 MG tablet TAKE 1 TABLET BY MOUTH ONCE DAILY.  . metFORMIN (GLUCOPHAGE) 500 MG tablet TAKE 2 TABLETS BY MOUTH TWICE DAILY.  Marland Kitchen omeprazole (PRILOSEC) 20 MG capsule TAKE 1 CAPSULE 2 TIMES A DAY BEFORE MEALS.  Marland Kitchen ONETOUCH DELICA LANCETS 01B MISC   . tamsulosin (FLOMAX) 0.4 MG CAPS capsule TAKE 2 CAPSULES NIGHTLY.  . traMADol (ULTRAM) 50 MG tablet Take one tablet po q 12 hrs prn pain   No facility-administered encounter medications on file as of 01/23/2021.    ALLERGIES: Allergies  Allergen Reactions  . Dilaudid [Hydromorphone Hcl] Other (See Comments)    PT SAID HE PASSED OUT WHEN HE TOOK IT    VACCINATION  STATUS: Immunization History  Administered Date(s) Administered  . Influenza, Seasonal, Injecte, Preservative Fre 06/19/2016  . Influenza,inj,Quad PF,6+ Mos 06/17/2017, 06/20/2018, 07/12/2019, 07/19/2020    Diabetes He presents for his follow-up diabetic visit. He has type 2 diabetes mellitus. Onset time: Diagnosed at approx age of 59. His disease course has been improving. There are no hypoglycemic associated symptoms. Pertinent negatives for diabetes include no fatigue, no foot paresthesias and no polydipsia. There are no hypoglycemic complications. Symptoms are improving. Diabetic complications include impotence, nephropathy and peripheral neuropathy. Risk factors for coronary artery disease include diabetes mellitus, dyslipidemia, family history, male sex and hypertension. Current diabetic treatment includes oral agent (dual therapy). He is compliant with treatment most of the time. His weight is decreasing steadily. He is following a generally healthy diet. When asked about meal planning, he reported none. He has not had a previous visit with a dietitian. He participates in exercise intermittently. His home blood glucose trend is decreasing steadily. His breakfast blood glucose range is generally 110-130 mg/dl. His lunch blood glucose range is generally 110-130 mg/dl. His dinner blood glucose range is generally 110-130 mg/dl. His bedtime blood glucose range is generally 110-130 mg/dl. (He presents today with his meter and logs showing at target fasting and postprandial glucose readings.  He is still working on diet and exercise and has lost 9 lbs since last visit.  He did have 1 occasion where he had mild hypoglycemia associated with meal timing.  Analysis of his meter shows 14-day average of 130.) An ACE inhibitor/angiotensin II receptor blocker is being taken. He does not see a podiatrist.Eye exam is current.  Hyperlipidemia This is a chronic problem. The current episode started more than 1 year  ago. The problem is uncontrolled. Recent lipid tests were reviewed and are variable. Exacerbating diseases include chronic renal disease, diabetes and liver disease. fatty liver. Factors aggravating his hyperlipidemia include fatty foods. Current antihyperlipidemic treatment includes statins. The current treatment provides mild improvement of lipids. Compliance problems include adherence to diet and adherence to exercise.  Risk factors for coronary artery disease include diabetes mellitus, dyslipidemia, family history, male sex, obesity and hypertension.  Hypertension This is a chronic problem. The current episode started more than 1 year ago. The problem has been gradually improving since onset. The problem is controlled. There are no associated agents to hypertension. Risk factors for coronary artery disease  include dyslipidemia, diabetes mellitus, family history and male gender. Past treatments include angiotensin blockers. The current treatment provides moderate improvement. There are no compliance problems.  Hypertensive end-organ damage includes kidney disease. Identifiable causes of hypertension include chronic renal disease.    Review of systems  Constitutional: + Minimally fluctuating body weight,  current There is no height or weight on file to calculate BMI. , + fatigue, no subjective hyperthermia, no subjective hypothermia Eyes: no blurry vision, reports intermittent floaters-none since last visit (has not recently had eye exam), no xerophthalmia ENT: no sore throat, no nodules palpated in throat, no dysphagia/odynophagia, no hoarseness Cardiovascular: no chest pain, no shortness of breath, no palpitations, no leg swelling Respiratory: no cough, no shortness of breath Gastrointestinal: no nausea/vomiting/diarrhea Musculoskeletal: no muscle/joint aches Skin: no rashes, no hyperemia Neurological: no tremors, mild numbness/tingling to BLE, no dizziness Psychiatric: no depression, no  anxiety   Objective:     BP 130/79   Pulse 60   Ht _0  (1.854 m)   Wt 207 lb (93.9 kg)   BMI 27.31 kg/m   Wt Readings from Last 3 Encounters:  01/23/21 207 lb (93.9 kg)  01/09/21 216 lb (98 kg)  12/20/20 218 lb (98.9 kg)     BP Readings from Last 3 Encounters:  01/23/21 130/79  01/09/21 115/78  12/20/20 109/80      Physical Exam- Limited  Constitutional:  Body mass index is 27.31 kg/m. , not in acute distress, normal state of mind Eyes:  EOMI, no exophthalmos Neck: Supple Cardiovascular: RRR, no murmers, rubs, or gallops, no edema Respiratory: Adequate breathing efforts, no crackles, rales, rhonchi, or wheezing Musculoskeletal: no gross deformities, strength intact in all four extremities, no gross restriction of joint movements Skin:  no rashes, no hyperemia Neurological: no tremor with outstretched hands   Foot exam:   No rashes, ulcers, cuts, calluses, onychodystrophy.   Good pulses bilat.  Decreased sensation to 10 g monofilament bilat.    POCT ABI Results 01/23/21   Right ABI:  1.28      Left ABI:  PAD  Right leg systolic / diastolic: 505/69 mmHg Left leg systolic / diastolic: PAD mmHg  Arm systolic / diastolic: 794/80 mmHG  Detailed report will be scanned into patient chart.   CMP ( most recent) CMP     Component Value Date/Time   NA 140 12/23/2020 0837   K 4.3 12/23/2020 0837   CL 100 12/23/2020 0837   CO2 24 12/23/2020 0837   GLUCOSE 216 (H) 12/23/2020 0837   GLUCOSE 161 (H) 12/12/2019 0302   BUN 13 12/23/2020 0837   CREATININE 0.99 12/23/2020 0837   CREATININE 0.78 04/18/2014 0719   CALCIUM 9.7 12/23/2020 0837   PROT 6.4 12/23/2020 0837   ALBUMIN 3.9 12/23/2020 0837   AST 23 12/23/2020 0837   ALT 24 12/23/2020 0837   ALKPHOS 87 12/23/2020 0837   BILITOT 0.7 12/23/2020 0837   GFRNONAA 88 06/11/2020 1204   GFRAA 101 06/11/2020 1204     Diabetic Labs (most recent): Lab Results  Component Value Date   HGBA1C 9.5 (H)  12/23/2020   HGBA1C 7.2 (H) 06/11/2020   HGBA1C 6.3 (A) 02/07/2020     Lipid Panel ( most recent) Lipid Panel     Component Value Date/Time   CHOL 133 12/23/2020 0837   TRIG 194 (H) 12/23/2020 0837   HDL 36 (L) 12/23/2020 0837   CHOLHDL 3.7 12/23/2020 0837   CHOLHDL 5.2 04/18/2014 0719   VLDL 41 (H)  04/18/2014 0719   LDLCALC 65 12/23/2020 0837   LABVLDL 32 12/23/2020 0837      Lab Results  Component Value Date   TSH 3.940 12/23/2020           Assessment & Plan:   1) Type 2 diabetes mellitus without complication, without long-term current use of insulin ([place])  He presents today with his meter and logs showing at target fasting and postprandial glucose readings.  He is still working on diet and exercise and has lost 9 lbs since last visit.  He did have 1 occasion where he had mild hypoglycemia associated with meal timing.  Analysis of his meter shows 14-day average of 130.  - Kellie Chisolm Pierotti has currently uncontrolled symptomatic type 2 DM since 60 years of age, with most recent A1c of 9.5 %.   -Recent labs reviewed.  - I had a long discussion with him about the progressive nature of diabetes and the pathology behind its complications. -his diabetes is complicated by neuropathy and mild CKD and he remains at a high risk for more acute and chronic complications which include CAD, CVA, CKD, retinopathy, and neuropathy. These are all discussed in detail with him.  - Nutritional counseling repeated at each appointment due to patients tendency to fall back in to old habits.  - The patient admits there is a room for improvement in their diet and drink choices. -  Suggestion is made for the patient to avoid simple carbohydrates from their diet including Cakes, Sweet Desserts / Pastries, Ice Cream, Soda (diet and regular), Sweet Tea, Candies, Chips, Cookies, Sweet Pastries,  Store Bought Juices, Alcohol in Excess of  1-2 drinks a day, Artificial Sweeteners, Coffee Creamer, and  "Sugar-free" Products. This will help patient to have stable blood glucose profile and potentially avoid unintended weight gain.   - I encouraged the patient to switch to  unprocessed or minimally processed complex starch and increased protein intake (animal or plant source), fruits, and vegetables.   - Patient is advised to stick to a routine mealtimes to eat 3 meals  a day and avoid unnecessary snacks ( to snack only to correct hypoglycemia).  - he will be scheduled with Jearld Fenton, RDN, CDE for diabetes education.  - I have approached him with the following individualized plan to manage  his diabetes and patient agrees:   -he is encouraged to continue monitoring blood glucose twice daily, before breakfast and before bed, and to call the clinic if he has readings less than 90 or greater than 300 for 3 tests in a row.  - Adjustment parameters are given to him for hypo and hyperglycemia in writing.  - he is advised to continue Metformin 1000 mg po twice daily with meals, therapeutically suitable for patient .  He is advised to continue Glipizide 5 mg po twice daily (cutting his current tab in half until he depletes his supply).  - he will be considered for incretin therapy as appropriate next visit.  - Specific targets for  A1c;  LDL, HDL,  and Triglycerides were discussed with the patient.  2) Blood Pressure /Hypertension:  his blood pressure is controlled to target.   he is advised to continue his current medications including Losartan 50 mg p.o. daily with breakfast.  3) Lipids/Hyperlipidemia:    Review of his recent lipid panel from 12/23/20 showed controlled  LDL at 65 and elevated triglycerides of 194 .  he  is advised to continue Lipitor 20 mg daily at  bedtime.  Side effects and precautions discussed with him.  He is also encouraged to avoid fried foods and butter.  4)  Weight/Diet:  his Body mass index is 27.31 kg/m.  -   he is a candidate for some weight loss. I discussed with  him the fact that loss of 5 - 10% of his  current body weight will have the most impact on his diabetes management.  Exercise, and detailed carbohydrates information provided  -  detailed on discharge instructions.  5) Chronic Care/Health Maintenance: -he is on ACEI/ARB and Statin medications and is encouraged to initiate and continue to follow up with Ophthalmology, Dentist, Podiatrist at least yearly or according to recommendations, and advised to stay away from smoking. I have recommended yearly flu vaccine and pneumonia vaccine at least every 5 years; moderate intensity exercise for up to 150 minutes weekly; and sleep for at least 7 hours a day.   -Regarding his abnormal ABI, he will be referred to [facility] for further evaluation and treatment of PAD.   - he is advised to maintain close follow up with Erven Colla, DO for primary care needs, as well as his other providers for optimal and coordinated care.     I spent 37 minutes in the care of the patient today including review of labs from [place], Lipids, Thyroid Function, Hematology (current and previous including abstractions from other facilities); face-to-face time discussing  his blood glucose readings/logs, discussing hypoglycemia and hyperglycemia episodes and symptoms, medications doses, his options of short and long term treatment based on the latest standards of care / guidelines;  discussion about incorporating lifestyle medicine;  and documenting the encounter.    Please refer to Patient Instructions for Blood Glucose Monitoring and Insulin/Medications Dosing Guide"  in media tab for additional information. Please  also refer to " Patient Self Inventory" in the Media  tab for reviewed elements of pertinent patient history.  [name] [name] participated in the discussions, expressed understanding, and voiced agreement with the above plans.  All questions were answered to his satisfaction. he is encouraged to contact clinic should he have  any questions or concerns prior to his return visit.   Follow up plan: - Return in about 3 months (around 04/25/2021) for Diabetes F/U with A1c in office, Bring meter and logs, No previsit labs.  Rayetta Pigg, [facility] [address] [place], [place] [address] Phone: [phone] Fax: [phone]  01/23/2021, 9:05 AM

## 2021-02-12 ENCOUNTER — Other Ambulatory Visit: Payer: Self-pay | Admitting: Family Medicine

## 2021-02-12 DIAGNOSIS — M545 Low back pain, unspecified: Secondary | ICD-10-CM

## 2021-02-12 DIAGNOSIS — G8929 Other chronic pain: Secondary | ICD-10-CM

## 2021-02-12 NOTE — Telephone Encounter (Signed)
Please contact patient to have him set up follow up. Thank you

## 2021-02-13 ENCOUNTER — Other Ambulatory Visit: Payer: Self-pay | Admitting: *Deleted

## 2021-02-13 DIAGNOSIS — M79606 Pain in leg, unspecified: Secondary | ICD-10-CM

## 2021-02-23 ENCOUNTER — Other Ambulatory Visit: Payer: Self-pay | Admitting: Family Medicine

## 2021-02-23 DIAGNOSIS — M545 Low back pain, unspecified: Secondary | ICD-10-CM

## 2021-02-23 DIAGNOSIS — G8929 Other chronic pain: Secondary | ICD-10-CM

## 2021-02-24 ENCOUNTER — Ambulatory Visit: Payer: BC Managed Care – PPO | Admitting: Nutrition

## 2021-02-24 NOTE — Telephone Encounter (Signed)
Seen for diabetes on 12/20/20. Tramadol is attached to the ones we would normally do by protocol so I couldn't send this one in

## 2021-03-24 ENCOUNTER — Other Ambulatory Visit: Payer: Self-pay | Admitting: Family Medicine

## 2021-03-24 DIAGNOSIS — G4700 Insomnia, unspecified: Secondary | ICD-10-CM

## 2021-03-26 ENCOUNTER — Other Ambulatory Visit: Payer: Self-pay | Admitting: Family Medicine

## 2021-03-26 DIAGNOSIS — G4700 Insomnia, unspecified: Secondary | ICD-10-CM

## 2021-04-07 ENCOUNTER — Encounter: Payer: BC Managed Care – PPO | Admitting: Vascular Surgery

## 2021-04-09 ENCOUNTER — Ambulatory Visit: Payer: BC Managed Care – PPO

## 2021-04-09 ENCOUNTER — Encounter: Payer: Self-pay | Admitting: Vascular Surgery

## 2021-04-09 ENCOUNTER — Other Ambulatory Visit: Payer: Self-pay

## 2021-04-09 ENCOUNTER — Ambulatory Visit (INDEPENDENT_AMBULATORY_CARE_PROVIDER_SITE_OTHER): Payer: BC Managed Care – PPO | Admitting: Vascular Surgery

## 2021-04-09 VITALS — BP 107/77 | HR 79 | Temp 97.9°F | Ht 73.0 in | Wt 198.6 lb

## 2021-04-09 DIAGNOSIS — R6889 Other general symptoms and signs: Secondary | ICD-10-CM

## 2021-04-09 DIAGNOSIS — M79606 Pain in leg, unspecified: Secondary | ICD-10-CM | POA: Diagnosis not present

## 2021-04-09 NOTE — Progress Notes (Signed)
[facility]  Patient name: [name] [name] MRN: [id number] DOB: [birth year] Sex: male  REASON FOR CONSULT: Discuss abnormal screening lower extremity noninvasive studies  HPI: DONIS PINDER is a 60 y.o. male, who is a long history of diabetes.  He underwent screening lower extremity noninvasive studies which were abnormal and is here today for formal lower extremity arterial study and evaluation.  He has never smoked.  He does report fairly typical neuropathy in his feet with tingling sensation.  He does not have any claudication symptoms.  No history of tissue loss in his lower extremities.  Past Medical History:  Diagnosis Date   Anxiety    Arthritis    Diabetes mellitus without complication ([place])    Diabetes mellitus, type II ([place])    Diverticulosis    pt unaware   ED (erectile dysfunction)    Fatty liver    GERD (gastroesophageal reflux disease)    History of COVID-19 07/2019   History of left inguinal hernia    History of retinal detachment    Right   Hyperlipidemia    Hypertension    Insomnia    Low ferritin level    Numbness and tingling of both feet    Shortness of breath dyspnea    since having COVID 07/2019 with exertions    History reviewed. No pertinent family history.  SOCIAL HISTORY: Social History   Socioeconomic History   Marital status: Married    Spouse name: Not on file   Number of children: Not on file   Years of education: Not on file   Highest education level: Not on file  Occupational History   Not on file  Tobacco Use   Smoking status: Never   Smokeless tobacco: Never  Vaping Use   Vaping Use: Never used  Substance and Sexual Activity   Alcohol use: No   Drug use: No   Sexual activity: Never  Other Topics Concern   Not on file  Social History Narrative   Not on file   Social Determinants of Health   Financial Resource Strain: Not on file  Food Insecurity: Not on  file  Transportation Needs: Not on file  Physical Activity: Not on file  Stress: Not on file  Social Connections: Not on file  Intimate Partner Violence: Not on file    Allergies  Allergen Reactions   Dilaudid [Hydromorphone Hcl] Other (See Comments)    PT SAID HE PASSED OUT WHEN HE TOOK IT    Current Outpatient Medications  Medication Sig Dispense Refill   acetaminophen (TYLENOL) 500 MG tablet Take 500 mg by mouth every 6 (six) hours as needed.     atorvastatin (LIPITOR) 20 MG tablet TAKE 1 TABLET BY MOUTH AT BEDTIME. 30 tablet 0   blood glucose meter kit and supplies KIT Dispense based on patient and insurance preference. Test sugar up to one time a day 1 each 0   CONTOUR NEXT TEST test strip USE TO TEST BLOOD SUGAR ONCE DAILY. 70 each 2   dutasteride (AVODART) 0.5 MG capsule TAKE 1 CAPSULE BY MOUTH ONCE DAILY. 90 capsule 0   fluticasone (FLONASE) 50 MCG/ACT nasal spray USE 2 SPRAYS IN EACH NOSTRIL EVERY DAY, AS NEEDED. (Patient taking differently: Place 2 sprays into both nostrils daily as needed (congestion.).) 16 g 11   glipiZIDE (GLUCOTROL) 5 MG tablet Take 1 tablet (5 mg total) by mouth 2 (two) times daily before a meal. 360 tablet 0  LORazepam (ATIVAN) 0.5 MG tablet TAKE 1 TABLET BY MOUTH AT BEDTIME. 30 tablet 2   losartan (COZAAR) 50 MG tablet TAKE 1 TABLET BY MOUTH ONCE DAILY. 90 tablet 0   metFORMIN (GLUCOPHAGE) 500 MG tablet TAKE 2 TABLETS BY MOUTH TWICE DAILY. 120 tablet 0   omeprazole (PRILOSEC) 20 MG capsule TAKE 1 CAPSULE 2 TIMES A DAY BEFORE MEALS. 180 capsule 0   ONETOUCH DELICA LANCETS 75T MISC   0   tamsulosin (FLOMAX) 0.4 MG CAPS capsule TAKE 2 CAPSULES NIGHTLY. 60 capsule 0   traMADol (ULTRAM) 50 MG tablet TAKE 1 TABLET BY MOUTH EVERY 12 HOURS AS NEEDED FOR PAIN. 60 tablet 2   No current facility-administered medications for this visit.    REVIEW OF SYSTEMS:  $RemoveB'[X]'MuHFcBGm$  denotes positive finding, $RemoveBeforeDEI'[ ]'ofZCQpaexDXqzMVc$  denotes negative finding Cardiac  Comments:  Chest pain or chest  pressure:    Shortness of breath upon exertion:    Short of breath when lying flat:    Irregular heart rhythm:        Vascular    Pain in calf, thigh, or hip brought on by ambulation:    Pain in feet at night that wakes you up from your sleep:     Blood clot in your veins:    Leg swelling:         Pulmonary    Oxygen at home:    Productive cough:     Wheezing:         Neurologic    Sudden weakness in arms or legs:     Sudden numbness in arms or legs:     Sudden onset of difficulty speaking or slurred speech:    Temporary loss of vision in one eye:     Problems with dizziness:         Gastrointestinal    Blood in stool:     Vomited blood:         Genitourinary    Burning when urinating:     Blood in urine:        Psychiatric    Major depression:         Hematologic    Bleeding problems:    Problems with blood clotting too easily:        Skin    Rashes or ulcers:        Constitutional    Fever or chills:      PHYSICAL EXAM: Vitals:   04/09/21 1023  BP: 107/77  Pulse: 79  Temp: 97.9 F (36.6 C)  TempSrc: Oral  SpO2: 97%  Weight: 198 lb 9.6 oz (90.1 kg)  Height: $Remove'6\' 1"'xxAUTbk$  (1.854 m)    GENERAL: The patient is a well-nourished male, in no acute distress. The vital signs are documented above. CARDIOVASCULAR: 2+ radial pulses bilaterally.  2+ femoral and 2+ popliteal pulses bilaterally.  Palpable dorsalis pedis and posterior tibial pulses bilaterally. PULMONARY: There is good air exchange  MUSCULOSKELETAL: There are no major deformities or cyanosis. NEUROLOGIC: No focal weakness or paresthesias are detected. SKIN: There are no ulcers or rashes noted. PSYCHIATRIC: The patient has a normal affect.  DATA:  Noninvasive studies in our office today reveal noncompressible lower extremity arteries therefore ankle Dermadex is not obtainable.  He does have triphasic waveforms at the posterior tibial and dorsalis pedis level bilaterally  MEDICAL ISSUES: Had long  discussion with the patient regarding his findings.  He is a non-smoker and I explained that this is a critical for him to  reduce risk for peripheral vascular occlusive disease.  I did explain that his arteries are noncompressible but there is no narrowing.  He should not have any difficulty with healing should he develop any lesions.  He was reassured with this discussion will see [place] again on an as-needed basis   Rosetta Posner, MD [facility] [phone] Pager [phone]  Note: Portions of this report may have been transcribed using voice recognition software.  Every effort has been made to ensure accuracy; however, inadvertent computerized transcription errors may still be present.

## 2021-04-21 ENCOUNTER — Other Ambulatory Visit: Payer: Self-pay | Admitting: Family Medicine

## 2021-04-22 ENCOUNTER — Other Ambulatory Visit: Payer: Self-pay | Admitting: Family Medicine

## 2021-04-22 DIAGNOSIS — G4700 Insomnia, unspecified: Secondary | ICD-10-CM

## 2021-04-25 ENCOUNTER — Ambulatory Visit: Payer: BC Managed Care – PPO | Admitting: Nurse Practitioner

## 2021-05-07 NOTE — Patient Instructions (Signed)
Diabetes Mellitus and Nutrition, Adult When you have diabetes, or diabetes mellitus, it is very important to have healthy eating habits because your blood sugar (glucose) levels are greatly affected by what you eat and drink. Eating healthy foods in the right amounts, at about the same times every day, can help you:  Control your blood glucose.  Lower your risk of heart disease.  Improve your blood pressure.  Reach or maintain a healthy weight. What can affect my meal plan? Every person with diabetes is different, and each person has different needs for a meal plan. Your health care provider may recommend that you work with a dietitian to make a meal plan that is best for you. Your meal plan may vary depending on factors such as:  The calories you need.  The medicines you take.  Your weight.  Your blood glucose, blood pressure, and cholesterol levels.  Your activity level.  Other health conditions you have, such as heart or kidney disease. How do carbohydrates affect me? Carbohydrates, also called carbs, affect your blood glucose level more than any other type of food. Eating carbs naturally raises the amount of glucose in your blood. Carb counting is a method for keeping track of how many carbs you eat. Counting carbs is important to keep your blood glucose at a healthy level, especially if you use insulin or take certain oral diabetes medicines. It is important to know how many carbs you can safely have in each meal. This is different for every person. Your dietitian can help you calculate how many carbs you should have at each meal and for each snack. How does alcohol affect me? Alcohol can cause a sudden decrease in blood glucose (hypoglycemia), especially if you use insulin or take certain oral diabetes medicines. Hypoglycemia can be a life-threatening condition. Symptoms of hypoglycemia, such as sleepiness, dizziness, and confusion, are similar to symptoms of having too much  alcohol.  Do not drink alcohol if: ? Your health care provider tells you not to drink. ? You are pregnant, may be pregnant, or are planning to become pregnant.  If you drink alcohol: ? Do not drink on an empty stomach. ? Limit how much you use to:  0-1 drink a day for women.  0-2 drinks a day for men. ? Be aware of how much alcohol is in your drink. In the [place] one drink equals one 12 oz bottle of beer (355 mL), one 5 oz glass of wine (148 mL), or one 1 oz glass of hard liquor (44 mL). ? Keep yourself hydrated with water, diet soda, or unsweetened iced tea.  Keep in mind that regular soda, juice, and other mixers may contain a lot of sugar and must be counted as carbs. What are tips for following this plan? Reading food labels  Start by checking the serving size on the "Nutrition Facts" label of packaged foods and drinks. The amount of calories, carbs, fats, and other nutrients listed on the label is based on one serving of the item. Many items contain more than one serving per package.  Check the total grams (g) of carbs in one serving. You can calculate the number of servings of carbs in one serving by dividing the total carbs by 15. For example, if a food has 30 g of total carbs per serving, it would be equal to 2 servings of carbs.  Check the number of grams (g) of saturated fats and trans fats in one serving. Choose foods that have   a low amount or none of these fats.  Check the number of milligrams (mg) of salt (sodium) in one serving. Most people should limit total sodium intake to less than 2,300 mg per day.  Always check the nutrition information of foods labeled as "low-fat" or "nonfat." These foods may be higher in added sugar or refined carbs and should be avoided.  Talk to your dietitian to identify your daily goals for nutrients listed on the label. Shopping  Avoid buying canned, pre-made, or processed foods. These foods tend to be high in fat, sodium, and added  sugar.  Shop around the outside edge of the grocery store. This is where you will most often find fresh fruits and vegetables, bulk grains, fresh meats, and fresh dairy. Cooking  Use low-heat cooking methods, such as baking, instead of high-heat cooking methods like deep frying.  Cook using healthy oils, such as olive, canola, or sunflower oil.  Avoid cooking with butter, cream, or high-fat meats. Meal planning  Eat meals and snacks regularly, preferably at the same times every day. Avoid going long periods of time without eating.  Eat foods that are high in fiber, such as fresh fruits, vegetables, beans, and whole grains. Talk with your dietitian about how many servings of carbs you can eat at each meal.  Eat 4-6 oz (112-168 g) of lean protein each day, such as lean meat, chicken, fish, eggs, or tofu. One ounce (oz) of lean protein is equal to: ? 1 oz (28 g) of meat, chicken, or fish. ? 1 egg. ?  cup (62 g) of tofu.  Eat some foods each day that contain healthy fats, such as avocado, nuts, seeds, and fish.   What foods should I eat? Fruits Berries. Apples. Oranges. Peaches. Apricots. Plums. Grapes. Mango. Papaya. Pomegranate. Kiwi. Cherries. Vegetables Lettuce. Spinach. Leafy greens, including kale, chard, collard greens, and mustard greens. Beets. Cauliflower. Cabbage. Broccoli. Carrots. Green beans. Tomatoes. Peppers. Onions. Cucumbers. Brussels sprouts. Grains Whole grains, such as whole-wheat or whole-grain bread, crackers, tortillas, cereal, and pasta. Unsweetened oatmeal. Quinoa. Brown or wild rice. Meats and other proteins Seafood. Poultry without skin. Lean cuts of poultry and beef. Tofu. Nuts. Seeds. Dairy Low-fat or fat-free dairy products such as milk, yogurt, and cheese. The items listed above may not be a complete list of foods and beverages you can eat. Contact a dietitian for more information. What foods should I avoid? Fruits Fruits canned with  syrup. Vegetables Canned vegetables. Frozen vegetables with butter or cream sauce. Grains Refined white flour and flour products such as bread, pasta, snack foods, and cereals. Avoid all processed foods. Meats and other proteins Fatty cuts of meat. Poultry with skin. Breaded or fried meats. Processed meat. Avoid saturated fats. Dairy Full-fat yogurt, cheese, or milk. Beverages Sweetened drinks, such as soda or iced tea. The items listed above may not be a complete list of foods and beverages you should avoid. Contact a dietitian for more information. Questions to ask a health care provider  Do I need to meet with a diabetes educator?  Do I need to meet with a dietitian?  What number can I call if I have questions?  When are the best times to check my blood glucose? Where to find more information:  American Diabetes Association: diabetes.org  Academy of Nutrition and Dietetics: www.eatright.org  National Institute of Diabetes and Digestive and Kidney Diseases: www.niddk.nih.gov  Association of Diabetes Care and Education Specialists: www.diabeteseducator.org Summary  It is important to have healthy eating   habits because your blood sugar (glucose) levels are greatly affected by what you eat and drink.  A healthy meal plan will help you control your blood glucose and maintain a healthy lifestyle.  Your health care provider may recommend that you work with a dietitian to make a meal plan that is best for you.  Keep in mind that carbohydrates (carbs) and alcohol have immediate effects on your blood glucose levels. It is important to count carbs and to use alcohol carefully. This information is not intended to replace advice given to you by your health care provider. Make sure you discuss any questions you have with your health care provider. Document Revised: 08/15/2019 Document Reviewed: 08/15/2019 [employer] Patient Education  2021 [employer]  

## 2021-05-08 ENCOUNTER — Encounter: Payer: Self-pay | Admitting: Nurse Practitioner

## 2021-05-08 ENCOUNTER — Other Ambulatory Visit: Payer: Self-pay

## 2021-05-08 ENCOUNTER — Ambulatory Visit: Payer: BC Managed Care – PPO | Admitting: Nurse Practitioner

## 2021-05-08 VITALS — BP 122/88 | HR 79 | Ht 73.0 in | Wt 203.0 lb

## 2021-05-08 DIAGNOSIS — I10 Essential (primary) hypertension: Secondary | ICD-10-CM | POA: Diagnosis not present

## 2021-05-08 DIAGNOSIS — E119 Type 2 diabetes mellitus without complications: Secondary | ICD-10-CM | POA: Diagnosis not present

## 2021-05-08 DIAGNOSIS — E782 Mixed hyperlipidemia: Secondary | ICD-10-CM | POA: Diagnosis not present

## 2021-05-08 LAB — POCT GLYCOSYLATED HEMOGLOBIN (HGB A1C): Hemoglobin A1C: 7.1 % — AB (ref 4.0–5.6)

## 2021-05-08 NOTE — Progress Notes (Signed)
Endocrinology Follow Up Note       05/08/2021, 11:53 AM   Subjective:    [name], male    DOB: [birth year].  [name] [name] is being seen in follow up after being seen in consultation for management of currently uncontrolled symptomatic diabetes requested by  Erven Colla, DO.   Past Medical History:  Diagnosis Date  . Anxiety   . Arthritis   . Diabetes mellitus without complication ([place])   . Diabetes mellitus, type II ([place])   . Diverticulosis    pt unaware  . ED (erectile dysfunction)   . Fatty liver   . GERD (gastroesophageal reflux disease)   . History of COVID-19 07/2019  . History of left inguinal hernia   . History of retinal detachment    Right  . Hyperlipidemia   . Hypertension   . Insomnia   . Low ferritin level   . Numbness and tingling of both feet   . Shortness of breath dyspnea    since having COVID 07/2019 with exertions    Past Surgical History:  Procedure Laterality Date  . CHOLECYSTECTOMY    . COLONOSCOPY N/A 11/30/2014   Procedure: COLONOSCOPY;  Surgeon: Daneil Dolin, MD;  Location: [facility];  Service: Endoscopy;  Laterality: N/A;  7:30 Am  . ESOPHAGEAL DILATION    . HERNIA REPAIR     1981  . RETINAL DETACHMENT SURGERY Right   . TOTAL KNEE ARTHROPLASTY Left 12/11/2019   Procedure: TOTAL KNEE ARTHROPLASTY;  Surgeon: Gaynelle Arabian, MD;  Location: [facility];  Service: Orthopedics;  Laterality: Left;  75mn  . VASCULAR SURGERY      Social History   Socioeconomic History  . Marital status: Married    Spouse name: Not on file  . Number of children: Not on file  . Years of education: Not on file  . Highest education level: Not on file  Occupational History  . Not on file  Tobacco Use  . Smoking status: Never  . Smokeless tobacco: Never  Vaping Use  . Vaping Use: Never used  Substance and Sexual Activity  . Alcohol use: No  . Drug use: No  .  Sexual activity: Never  Other Topics Concern  . Not on file  Social History Narrative  . Not on file   Social Determinants of Health   Financial Resource Strain: Not on file  Food Insecurity: Not on file  Transportation Needs: Not on file  Physical Activity: Not on file  Stress: Not on file  Social Connections: Not on file    History reviewed. No pertinent family history.  Outpatient Encounter Medications as of 05/08/2021  Medication Sig  . acetaminophen (TYLENOL) 500 MG tablet Take 500 mg by mouth every 6 (six) hours as needed.  .Marland Kitchenatorvastatin (LIPITOR) 20 MG tablet TAKE 1 TABLET BY MOUTH AT BEDTIME.  . blood glucose meter kit and supplies KIT Dispense based on patient and insurance preference. Test sugar up to one time a day  . CONTOUR NEXT TEST test strip USE TO TEST BLOOD SUGAR ONCE DAILY.  .Marland Kitchendutasteride (AVODART) 0.5 MG capsule TAKE 1 CAPSULE BY MOUTH ONCE DAILY.  .Marland Kitchen  fluticasone (FLONASE) 50 MCG/ACT nasal spray USE 2 SPRAYS IN EACH NOSTRIL EVERY DAY, AS NEEDED. (Patient taking differently: Place 2 sprays into both nostrils daily as needed (congestion.).)  . glipiZIDE (GLUCOTROL) 5 MG tablet Take 1 tablet (5 mg total) by mouth 2 (two) times daily before a meal.  . LORazepam (ATIVAN) 0.5 MG tablet TAKE 1 TABLET BY MOUTH AT BEDTIME.  Marland Kitchen losartan (COZAAR) 50 MG tablet TAKE 1 TABLET BY MOUTH ONCE DAILY.  . metFORMIN (GLUCOPHAGE) 500 MG tablet TAKE 2 TABLETS BY MOUTH TWICE DAILY.  Marland Kitchen omeprazole (PRILOSEC) 20 MG capsule TAKE 1 CAPSULE 2 TIMES A DAY BEFORE MEALS.  Marland Kitchen ONETOUCH DELICA LANCETS 50N MISC   . tamsulosin (FLOMAX) 0.4 MG CAPS capsule TAKE 2 CAPSULES NIGHTLY.  . traMADol (ULTRAM) 50 MG tablet TAKE 1 TABLET BY MOUTH EVERY 12 HOURS AS NEEDED FOR PAIN.   No facility-administered encounter medications on file as of 05/08/2021.    ALLERGIES: Allergies  Allergen Reactions  . Dilaudid [Hydromorphone Hcl] Other (See Comments)    PT SAID HE PASSED OUT WHEN HE TOOK IT    VACCINATION  STATUS: Immunization History  Administered Date(s) Administered  . Influenza, Seasonal, Injecte, Preservative Fre 06/19/2016  . Influenza,inj,Quad PF,6+ Mos 06/17/2017, 06/20/2018, 07/12/2019, 07/19/2020    Diabetes He presents for his follow-up diabetic visit. He has type 2 diabetes mellitus. Onset time: Diagnosed at approx age of 30. His disease course has been improving. There are no hypoglycemic associated symptoms. Pertinent negatives for diabetes include no fatigue, no foot paresthesias and no polydipsia. There are no hypoglycemic complications. Symptoms are improving. Diabetic complications include impotence, nephropathy and peripheral neuropathy. Risk factors for coronary artery disease include diabetes mellitus, dyslipidemia, family history, male sex and hypertension. Current diabetic treatment includes oral agent (dual therapy). He is compliant with treatment most of the time. His weight is fluctuating minimally. He is following a generally healthy diet. When asked about meal planning, he reported none. He has not had a previous visit with a dietitian. He participates in exercise intermittently. His home blood glucose trend is decreasing steadily. His breakfast blood glucose range is generally 130-140 mg/dl. (He presents today with his meter and logs showing stable glycemic profile overall.  His POCT A1c today is 7.1%, improving from last visit of 9.5%.  He does indulge in foods he knows will run his sugar up but tries to do so in moderation.  He denies any hypoglycemia.  Analysis of his meter shows 14-day average of 144.) An ACE inhibitor/angiotensin II receptor blocker is being taken. He does not see a podiatrist.Eye exam is current.  Hyperlipidemia This is a chronic problem. The current episode started more than 1 year ago. The problem is uncontrolled. Recent lipid tests were reviewed and are variable. Exacerbating diseases include chronic renal disease, diabetes and liver disease. fatty  liver. Factors aggravating his hyperlipidemia include fatty foods. Current antihyperlipidemic treatment includes statins. The current treatment provides mild improvement of lipids. Compliance problems include adherence to diet and adherence to exercise.  Risk factors for coronary artery disease include diabetes mellitus, dyslipidemia, family history, male sex, obesity and hypertension.  Hypertension This is a chronic problem. The current episode started more than 1 year ago. The problem has been gradually improving since onset. The problem is controlled. There are no associated agents to hypertension. Risk factors for coronary artery disease include dyslipidemia, diabetes mellitus, family history and male gender. Past treatments include angiotensin blockers. The current treatment provides moderate improvement. There are no compliance  problems.  Hypertensive end-organ damage includes kidney disease. Identifiable causes of hypertension include chronic renal disease.   Review of systems  Constitutional: + steadily decreasing body weight,  current Body mass index is 26.78 kg/m. , no fatigue, no subjective hyperthermia, no subjective hypothermia Eyes: no blurry vision, no xerophthalmia ENT: no sore throat, no nodules palpated in throat, no dysphagia/odynophagia, no hoarseness Cardiovascular: no chest pain, no shortness of breath, no palpitations, no leg swelling Respiratory: no cough, no shortness of breath Gastrointestinal: no nausea/vomiting/diarrhea Musculoskeletal: no muscle/joint aches Skin: no rashes, no hyperemia Neurological: no tremors, + numbness/tingling to BLE, no dizziness Psychiatric: no depression, no anxiety   Objective:     BP 122/88   Pulse 79   Ht $R'6\' 1"'Ql$  (1.854 m)   Wt 203 lb (92.1 kg)   BMI 26.78 kg/m   Wt Readings from Last 3 Encounters:  05/08/21 203 lb (92.1 kg)  04/09/21 198 lb 9.6 oz (90.1 kg)  01/23/21 207 lb (93.9 kg)     BP Readings from Last 3 Encounters:   05/08/21 122/88  04/09/21 107/77  01/23/21 130/79      Physical Exam- Limited  Constitutional:  Body mass index is 26.78 kg/m. , not in acute distress, normal state of mind Eyes:  EOMI, no exophthalmos Neck: Supple Cardiovascular: RRR, no murmurs, rubs, or gallops, no edema Respiratory: Adequate breathing efforts, no crackles, rales, rhonchi, or wheezing Musculoskeletal: no gross deformities, strength intact in all four extremities, no gross restriction of joint movements Skin:  no rashes, no hyperemia Neurological: no tremor with outstretched hands    CMP ( most recent) CMP     Component Value Date/Time   NA 140 12/23/2020 0837   K 4.3 12/23/2020 0837   CL 100 12/23/2020 0837   CO2 24 12/23/2020 0837   GLUCOSE 216 (H) 12/23/2020 0837   GLUCOSE 161 (H) 12/12/2019 0302   BUN 13 12/23/2020 0837   CREATININE 0.99 12/23/2020 0837   CREATININE 0.78 04/18/2014 0719   CALCIUM 9.7 12/23/2020 0837   PROT 6.4 12/23/2020 0837   ALBUMIN 3.9 12/23/2020 0837   AST 23 12/23/2020 0837   ALT 24 12/23/2020 0837   ALKPHOS 87 12/23/2020 0837   BILITOT 0.7 12/23/2020 0837   GFRNONAA 88 06/11/2020 1204   GFRAA 101 06/11/2020 1204     Diabetic Labs (most recent): Lab Results  Component Value Date   HGBA1C 7.1 (A) 05/08/2021   HGBA1C 9.5 (H) 12/23/2020   HGBA1C 7.2 (H) 06/11/2020     Lipid Panel ( most recent) Lipid Panel     Component Value Date/Time   CHOL 133 12/23/2020 0837   TRIG 194 (H) 12/23/2020 0837   HDL 36 (L) 12/23/2020 0837   CHOLHDL 3.7 12/23/2020 0837   CHOLHDL 5.2 04/18/2014 0719   VLDL 41 (H) 04/18/2014 0719   LDLCALC 65 12/23/2020 0837   LABVLDL 32 12/23/2020 0837      Lab Results  Component Value Date   TSH 3.940 12/23/2020           Assessment & Plan:   1) Type 2 diabetes mellitus without complication, without long-term current use of insulin ([place])  He presents today with his meter and logs showing stable glycemic profile overall.  His  POCT A1c today is 7.1%, improving from last visit of 9.5%.  He does indulge in foods he knows will run his sugar up but tries to do so in moderation.  He denies any hypoglycemia.  Analysis of his meter shows  14-day average of 144.  - [name] [name] has currently uncontrolled symptomatic type 2 DM since 60 years of age.   -Recent labs reviewed.  - I had a long discussion with him about the progressive nature of diabetes and the pathology behind its complications. -his diabetes is complicated by neuropathy and mild CKD and he remains at a high risk for more acute and chronic complications which include CAD, CVA, CKD, retinopathy, and neuropathy. These are all discussed in detail with him.  - Nutritional counseling repeated at each appointment due to patients tendency to fall back in to old habits.  - The patient admits there is a room for improvement in their diet and drink choices. -  Suggestion is made for the patient to avoid simple carbohydrates from their diet including Cakes, Sweet Desserts / Pastries, Ice Cream, Soda (diet and regular), Sweet Tea, Candies, Chips, Cookies, Sweet Pastries, Store Bought Juices, Alcohol in Excess of 1-2 drinks a day, Artificial Sweeteners, Coffee Creamer, and "Sugar-free" Products. This will help patient to have stable blood glucose profile and potentially avoid unintended weight gain.   - I encouraged the patient to switch to unprocessed or minimally processed complex starch and increased protein intake (animal or plant source), fruits, and vegetables.   - Patient is advised to stick to a routine mealtimes to eat 3 meals a day and avoid unnecessary snacks (to snack only to correct hypoglycemia).  - he will be scheduled with Jearld Fenton, RDN, CDE for diabetes education.  - I have approached him with the following individualized plan to manage  his diabetes and patient agrees:   -Based on his stable, near target glycemic profile, he is advised to continue  current regimen of Metformin 1000 mg po twice daily with meals and Glipizide 5 mg po twice daily with meals.  -he is encouraged to continue monitoring blood glucose at least once daily, before breakfast and to call the clinic if he has readings less than 90 or greater than 200 for 3 tests in a row.  - Adjustment parameters are given to him for hypo and hyperglycemia in writing.  - he will be considered for incretin therapy as appropriate next visit.  - Specific targets for  A1c;  LDL, HDL,  and Triglycerides were discussed with the patient.  2) Blood Pressure /Hypertension:  his blood pressure is controlled to target.   he is advised to continue his current medications including Losartan 50 mg p.o. daily with breakfast.  3) Lipids/Hyperlipidemia:    Review of his recent lipid panel from 12/23/20 showed controlled  LDL at 65 and elevated triglycerides of 194 .  he  is advised to continue Lipitor 20 mg daily at bedtime.  Side effects and precautions discussed with him.  He is also encouraged to avoid fried foods and butter.  4)  Weight/Diet:  his Body mass index is 26.78 kg/m.  -   he is a candidate for some weight loss. I discussed with him the fact that loss of 5 - 10% of his  current body weight will have the most impact on his diabetes management.  Exercise, and detailed carbohydrates information provided  -  detailed on discharge instructions.  5) Chronic Care/Health Maintenance: -he is on ACEI/ARB and Statin medications and is encouraged to initiate and continue to follow up with Ophthalmology, Dentist, Podiatrist at least yearly or according to recommendations, and advised to stay away from smoking. I have recommended yearly flu vaccine and pneumonia vaccine at least  every 5 years; moderate intensity exercise for up to 150 minutes weekly; and sleep for at least 7 hours a day.    - he is advised to maintain close follow up with Erven Colla, DO for primary care needs, as well as his  other providers for optimal and coordinated care.      I spent 30 minutes in the care of the patient today including review of labs from [place], Lipids, Thyroid Function, Hematology (current and previous including abstractions from other facilities); face-to-face time discussing  his blood glucose readings/logs, discussing hypoglycemia and hyperglycemia episodes and symptoms, medications doses, his options of short and long term treatment based on the latest standards of care / guidelines;  discussion about incorporating lifestyle medicine;  and documenting the encounter.    Please refer to Patient Instructions for Blood Glucose Monitoring and Insulin/Medications Dosing Guide"  in media tab for additional information. Please  also refer to " Patient Self Inventory" in the Media  tab for reviewed elements of pertinent patient history.  [name] [name] participated in the discussions, expressed understanding, and voiced agreement with the above plans.  All questions were answered to his satisfaction. he is encouraged to contact clinic should he have any questions or concerns prior to his return visit.   Follow up plan: - Return in about 4 months (around 09/07/2021) for Diabetes F/U with A1c in office, No previsit labs, Bring meter and logs.  Rayetta Pigg, [facility] [address] [place], [place] [address] Phone: [phone] Fax: [phone]  05/08/2021, 11:53 AM

## 2021-05-14 ENCOUNTER — Other Ambulatory Visit: Payer: Self-pay | Admitting: Family Medicine

## 2021-05-15 NOTE — Telephone Encounter (Signed)
pls call pt that needs follow up with the new provider. Refills given till new provider comes. Thx. Dr Ladona Ridgel

## 2021-05-22 ENCOUNTER — Other Ambulatory Visit: Payer: Self-pay | Admitting: Family Medicine

## 2021-06-23 ENCOUNTER — Other Ambulatory Visit: Payer: Self-pay | Admitting: Family Medicine

## 2021-06-24 NOTE — Telephone Encounter (Signed)
Sent my chart message  10/4 

## 2021-07-02 NOTE — Telephone Encounter (Signed)
Sent second request to schedule appointment 07/02/21 

## 2021-07-22 ENCOUNTER — Other Ambulatory Visit: Payer: Self-pay | Admitting: Family Medicine

## 2021-07-22 DIAGNOSIS — G4700 Insomnia, unspecified: Secondary | ICD-10-CM

## 2021-07-23 ENCOUNTER — Telehealth: Payer: Self-pay | Admitting: Family Medicine

## 2021-07-23 ENCOUNTER — Other Ambulatory Visit: Payer: Self-pay | Admitting: Family Medicine

## 2021-07-23 DIAGNOSIS — G8929 Other chronic pain: Secondary | ICD-10-CM

## 2021-07-23 NOTE — Telephone Encounter (Signed)
Fax from [facility] requesting refill on Lorazepam 0.5 mg tablet. Take one tablet po at bedtime. Pt last seen 12/20/20 for HTN. Has upcoming appt for 08/01/21. Please advise. Thank you!

## 2021-07-26 ENCOUNTER — Other Ambulatory Visit: Payer: Self-pay | Admitting: Family Medicine

## 2021-07-26 DIAGNOSIS — G4700 Insomnia, unspecified: Secondary | ICD-10-CM

## 2021-07-26 MED ORDER — LORAZEPAM 0.5 MG PO TABS: 0.5000 mg | ORAL_TABLET | Freq: Every evening | ORAL | 0 refills | Status: DC | PRN

## 2021-07-26 NOTE — Progress Notes (Signed)
Refilled. Sent to [facility].

## 2021-08-01 ENCOUNTER — Ambulatory Visit: Payer: BC Managed Care – PPO | Admitting: Family Medicine

## 2021-08-19 ENCOUNTER — Ambulatory Visit: Payer: BC Managed Care – PPO | Admitting: Family Medicine

## 2021-08-19 ENCOUNTER — Other Ambulatory Visit: Payer: Self-pay

## 2021-08-19 VITALS — BP 130/80 | Ht 73.0 in | Wt 204.6 lb

## 2021-08-19 DIAGNOSIS — E119 Type 2 diabetes mellitus without complications: Secondary | ICD-10-CM

## 2021-08-19 DIAGNOSIS — I10 Essential (primary) hypertension: Secondary | ICD-10-CM

## 2021-08-19 DIAGNOSIS — Z23 Encounter for immunization: Secondary | ICD-10-CM | POA: Diagnosis not present

## 2021-08-19 DIAGNOSIS — E785 Hyperlipidemia, unspecified: Secondary | ICD-10-CM | POA: Diagnosis not present

## 2021-08-19 NOTE — Assessment & Plan Note (Signed)
At goal.  Continue losartan. 

## 2021-08-19 NOTE — Assessment & Plan Note (Signed)
LDL at goal.  Triglycerides elevated.  We will continue to monitor.  Continue Lipitor.

## 2021-08-19 NOTE — Progress Notes (Signed)
Subjective:  [name], male    DOB: [birth year]  Age: 60 y.o. MRN: [id number]  CC: Chief Complaint  Patient presents with   Hyperlipidemia    Follow up  Needs labs No problems or concerns    HPI:  60 year old male with hypertension, type 2 diabetes, hyperlipidemia presents for follow-up.  Patient states that he is doing well.  Hypertension Blood pressure at goal. He is currently on losartan.  No adverse side effects.  No reports of hypertension.  Type 2 diabetes Patient seen endocrinology.  His A1c is nearly at goal.  Most recent A1c 7.1. He is on metformin 1000 mg twice daily and glipizide 5 mg twice daily.  No hypoglycemia. Patient is due for an eye exam.  He states that he has an appointment next month. Patient wants flu vaccine today.  He wants to wait on his pneumococcal vaccine.  Hyperlipidemia Most recent LDL was 65.  Triglycerides mildly elevated. He is compliant with Lipitor.  Patient Active Problem List   Diagnosis Date Noted   Nocturia associated with benign prostatic hyperplasia 12/20/2020   Seasonal allergic rhinitis due to pollen 12/10/2020   Osteoarthritis of left knee 12/11/2019   Hyperlipidemia 06/20/2018   Erectile dysfunction 12/16/2016   Essential hypertension 06/19/2016   Type 2 diabetes mellitus (HCC) 03/20/2015   Esophageal reflux 06/25/2014    Social Hx   Social History   Socioeconomic History   Marital status: Married    Spouse name: Not on file   Number of children: Not on file   Years of education: Not on file   Highest education level: Not on file  Occupational History   Not on file  Tobacco Use   Smoking status: Never   Smokeless tobacco: Never  Vaping Use   Vaping Use: Never used  Substance and Sexual Activity   Alcohol use: No   Drug use: No   Sexual activity: Never  Other Topics Concern   Not on file  Social History Narrative   Not on file   Social Determinants of Health   Financial Resource  Strain: Not on file  Food Insecurity: Not on file  Transportation Needs: Not on file  Physical Activity: Not on file  Stress: Not on file  Social Connections: Not on file    Review of Systems  Constitutional: Negative.   Respiratory: Negative.    Cardiovascular: Negative.     Objective:  BP 130/80   Ht 6\' 1"  (1.854 m)   Wt 204 lb 9.6 oz (92.8 kg)   BMI 26.99 kg/m   BP/Weight 08/19/2021 05/08/2021 04/09/2021  Systolic BP 130 122 107  Diastolic BP 80 88 77  Wt. (Lbs) 204.6 203 198.6  BMI 26.99 26.78 26.2    Physical Exam Constitutional:      General: He is not in acute distress.    Appearance: Normal appearance.  HENT:     Head: Normocephalic and atraumatic.  Eyes:     General:        Right eye: No discharge.        Left eye: No discharge.     Conjunctiva/sclera: Conjunctivae normal.  Cardiovascular:     Rate and Rhythm: Normal rate and regular rhythm.  Pulmonary:     Effort: Pulmonary effort is normal.     Breath sounds: Normal breath sounds. No wheezing, rhonchi or rales.  Neurological:     Mental Status: He is alert.  Psychiatric:  Mood and Affect: Mood normal.        Behavior: Behavior normal.    Lab Results  Component Value Date   WBC 6.8 12/23/2020   HGB 12.1 (L) 12/23/2020   HCT 38.0 12/23/2020   PLT 197 06/11/2020   GLUCOSE 216 (H) 12/23/2020   CHOL 133 12/23/2020   TRIG 194 (H) 12/23/2020   HDL 36 (L) 12/23/2020   LDLCALC 65 12/23/2020   ALT 24 12/23/2020   AST 23 12/23/2020   NA 140 12/23/2020   K 4.3 12/23/2020   CL 100 12/23/2020   CREATININE 0.99 12/23/2020   BUN 13 12/23/2020   CO2 24 12/23/2020   TSH 3.940 12/23/2020   PSA 0.79 04/18/2014   INR 1.2 12/04/2019   HGBA1C 7.1 (A) 05/08/2021     Assessment & Plan:   Problem List Items Addressed This Visit       Cardiovascular and Mediastinum   Essential hypertension - Primary    At goal.  Continue losartan.        Endocrine   Type 2 diabetes mellitus (HCC)     Patient is right at his goal A1c.  We will continue to monitor closely.  Continue metformin and glipizide.  Likely does not need to continue to follow with endocrinology.  I can manage this.        Other   Hyperlipidemia    LDL at goal.  Triglycerides elevated.  We will continue to monitor.  Continue Lipitor.      Other Visit Diagnoses     Need for vaccination       Relevant Orders   Flu Vaccine QUAD 6+ mos PF IM (Fluarix Quad PF) (Completed)       Follow-up:  Return in about 6 months (around 02/16/2022) for Follow up Chronic medical issues.  Everlene Other DO [facility]

## 2021-08-19 NOTE — Patient Instructions (Signed)
You're doing well. ° °Continue your current medications. ° °Follow up in 6 months. ° °Take care ° °Dr. Zadyn Yardley  °

## 2021-08-19 NOTE — Assessment & Plan Note (Signed)
Patient is right at his goal A1c.  We will continue to monitor closely.  Continue metformin and glipizide.  Likely does not need to continue to follow with endocrinology.  I can manage this.

## 2021-08-21 ENCOUNTER — Other Ambulatory Visit: Payer: Self-pay | Admitting: Family Medicine

## 2021-09-12 ENCOUNTER — Other Ambulatory Visit: Payer: Self-pay | Admitting: Family Medicine

## 2021-09-12 DIAGNOSIS — G4700 Insomnia, unspecified: Secondary | ICD-10-CM

## 2021-09-17 ENCOUNTER — Other Ambulatory Visit: Payer: Self-pay

## 2021-09-17 ENCOUNTER — Other Ambulatory Visit: Payer: Self-pay | Admitting: Family Medicine

## 2021-09-17 ENCOUNTER — Encounter: Payer: Self-pay | Admitting: Nurse Practitioner

## 2021-09-17 ENCOUNTER — Ambulatory Visit: Payer: BC Managed Care – PPO | Admitting: Nurse Practitioner

## 2021-09-17 VITALS — BP 111/72 | HR 91 | Ht 73.0 in | Wt 205.0 lb

## 2021-09-17 DIAGNOSIS — E782 Mixed hyperlipidemia: Secondary | ICD-10-CM | POA: Diagnosis not present

## 2021-09-17 DIAGNOSIS — E119 Type 2 diabetes mellitus without complications: Secondary | ICD-10-CM | POA: Diagnosis not present

## 2021-09-17 DIAGNOSIS — I10 Essential (primary) hypertension: Secondary | ICD-10-CM

## 2021-09-17 DIAGNOSIS — G4700 Insomnia, unspecified: Secondary | ICD-10-CM

## 2021-09-17 LAB — POCT GLYCOSYLATED HEMOGLOBIN (HGB A1C): HbA1c POC (<> result, manual entry): 7.8 % (ref 4.0–5.6)

## 2021-09-17 MED ORDER — GLIPIZIDE 5 MG PO TABS: 5.0000 mg | ORAL_TABLET | Freq: Two times a day (BID) | ORAL | 3 refills | Status: DC

## 2021-09-17 MED ORDER — METFORMIN HCL 500 MG PO TABS: 1000.0000 mg | ORAL_TABLET | Freq: Two times a day (BID) | ORAL | 3 refills | Status: DC

## 2021-09-17 NOTE — Patient Instructions (Signed)

## 2021-09-17 NOTE — Progress Notes (Signed)
Endocrinology Follow Up Note       09/17/2021, 12:00 PM   Subjective:    [name], male    DOB: [birth year].  [name] [name] is being seen in follow up after being seen in consultation for management of currently uncontrolled symptomatic diabetes requested by  [name], DO.   Past Medical History:  Diagnosis Date   Anxiety    Arthritis    Diabetes mellitus without complication ([place])    Diabetes mellitus, type II ([place])    Diverticulosis    pt unaware   ED (erectile dysfunction)    Fatty liver    GERD (gastroesophageal reflux disease)    History of COVID-19 07/2019   History of left inguinal hernia    History of retinal detachment    Right   Hyperlipidemia    Hypertension    Insomnia    Low ferritin level    Numbness and tingling of both feet    Shortness of breath dyspnea    since having COVID 07/2019 with exertions    Past Surgical History:  Procedure Laterality Date   CHOLECYSTECTOMY     COLONOSCOPY N/A 11/30/2014   Procedure: COLONOSCOPY;  Surgeon: Daneil Dolin, MD;  Location: [facility];  Service: Endoscopy;  Laterality: N/A;  7:30 Am   ESOPHAGEAL DILATION     HERNIA REPAIR     1981   RETINAL DETACHMENT SURGERY Right    TOTAL KNEE ARTHROPLASTY Left 12/11/2019   Procedure: TOTAL KNEE ARTHROPLASTY;  Surgeon: Gaynelle Arabian, MD;  Location: [facility];  Service: Orthopedics;  Laterality: Left;  62min   VASCULAR SURGERY      Social History   Socioeconomic History   Marital status: Married    Spouse name: Not on file   Number of children: Not on file   Years of education: Not on file   Highest education level: Not on file  Occupational History   Not on file  Tobacco Use   Smoking status: Never   Smokeless tobacco: Never  Vaping Use   Vaping Use: Never used  Substance and Sexual Activity   Alcohol use: No   Drug use: No   Sexual activity: Never  Other Topics  Concern   Not on file  Social History Narrative   Not on file   Social Determinants of Health   Financial Resource Strain: Not on file  Food Insecurity: Not on file  Transportation Needs: Not on file  Physical Activity: Not on file  Stress: Not on file  Social Connections: Not on file    History reviewed. No pertinent family history.  Outpatient Encounter Medications as of 09/17/2021  Medication Sig   acetaminophen (TYLENOL) 500 MG tablet Take 500 mg by mouth every 6 (six) hours as needed.   atorvastatin (LIPITOR) 20 MG tablet TAKE 1 TABLET BY MOUTH AT BEDTIME.   blood glucose meter kit and supplies KIT Dispense based on patient and insurance preference. Test sugar up to one time a day   CONTOUR NEXT TEST test strip USE TO TEST BLOOD SUGAR ONCE DAILY.   fluticasone (FLONASE) 50 MCG/ACT nasal spray USE 2 SPRAYS IN EACH NOSTRIL EVERY  DAY, AS NEEDED. (Patient taking differently: Place 2 sprays into both nostrils daily as needed (congestion.).)   glipiZIDE (GLUCOTROL) 5 MG tablet Take 1 tablet (5 mg total) by mouth 2 (two) times daily before a meal.   LORazepam (ATIVAN) 0.5 MG tablet TAKE 1 TABLET BY MOUTH AT BEDTIME.   metFORMIN (GLUCOPHAGE) 500 MG tablet Take 2 tablets (1,000 mg total) by mouth 2 (two) times daily.   omeprazole (PRILOSEC) 20 MG capsule TAKE 1 CAPSULE 2 TIMES A DAY BEFORE MEALS.   ONETOUCH DELICA LANCETS 78M MISC    tamsulosin (FLOMAX) 0.4 MG CAPS capsule TAKE 2 CAPSULES NIGHTLY.   traMADol (ULTRAM) 50 MG tablet TAKE 1 TABLET BY MOUTH EVERY 12 HOURS AS NEEDED FOR PAIN.   [DISCONTINUED] dutasteride (AVODART) 0.5 MG capsule TAKE 1 CAPSULE BY MOUTH ONCE DAILY.   [DISCONTINUED] glipiZIDE (GLUCOTROL) 5 MG tablet Take 1 tablet (5 mg total) by mouth 2 (two) times daily before a meal.   [DISCONTINUED] losartan (COZAAR) 50 MG tablet TAKE 1 TABLET BY MOUTH ONCE DAILY.   [DISCONTINUED] metFORMIN (GLUCOPHAGE) 500 MG tablet TAKE 2 TABLETS BY MOUTH TWICE DAILY.   No  facility-administered encounter medications on file as of 09/17/2021.    ALLERGIES: Allergies  Allergen Reactions   Dilaudid [Hydromorphone Hcl] Other (See Comments)    PT SAID HE PASSED OUT WHEN HE TOOK IT    VACCINATION STATUS: Immunization History  Administered Date(s) Administered   Influenza, Seasonal, Injecte, Preservative Fre 06/19/2016   Influenza,inj,Quad PF,6+ Mos 06/17/2017, 06/20/2018, 07/12/2019, 07/19/2020, 08/19/2021    Diabetes He presents for his follow-up diabetic visit. He has type 2 diabetes mellitus. Onset time: Diagnosed at approx age of 50. His disease course has been stable. There are no hypoglycemic associated symptoms. Pertinent negatives for diabetes include no fatigue, no foot paresthesias and no polydipsia. There are no hypoglycemic complications. Symptoms are stable. Diabetic complications include impotence, nephropathy and peripheral neuropathy. Risk factors for coronary artery disease include diabetes mellitus, dyslipidemia, family history, male sex and hypertension. Current diabetic treatment includes oral agent (dual therapy). He is compliant with treatment most of the time. His weight is fluctuating minimally. He is following a generally healthy diet. When asked about meal planning, he reported none. He has not had a previous visit with a dietitian. He participates in exercise intermittently. His home blood glucose trend is increasing steadily. His breakfast blood glucose range is generally 140-180 mg/dl. (He presents today with his meter and logs showing above target fasting glycemic profile.  His POCT A1c today is 7.8%, increasing from last visit of 7.1%.  He recently went on a cruise and has also indulged in holiday sweets which have impacted his readings.  He has plans to get back on track.  He denies any hypoglycemia.) An ACE inhibitor/angiotensin II receptor blocker is being taken. He does not see a podiatrist.Eye exam is current.  Hyperlipidemia This is a  chronic problem. The current episode started more than 1 year ago. The problem is uncontrolled. Recent lipid tests were reviewed and are variable. Exacerbating diseases include chronic renal disease, diabetes and liver disease. fatty liver. Factors aggravating his hyperlipidemia include fatty foods. Current antihyperlipidemic treatment includes statins and diet change. The current treatment provides mild improvement of lipids. Compliance problems include adherence to diet and adherence to exercise.  Risk factors for coronary artery disease include diabetes mellitus, dyslipidemia, family history, male sex, obesity and hypertension.  Hypertension This is a chronic problem. The current episode started more than 1 year ago.  The problem has been resolved since onset. The problem is controlled. There are no associated agents to hypertension. Risk factors for coronary artery disease include dyslipidemia, diabetes mellitus, family history and male gender. Past treatments include angiotensin blockers. The current treatment provides moderate improvement. There are no compliance problems.  Hypertensive end-organ damage includes kidney disease. Identifiable causes of hypertension include chronic renal disease.   Review of systems  Constitutional: + Minimally fluctuating body weight,  current Body mass index is 27.05 kg/m. , no fatigue, no subjective hyperthermia, no subjective hypothermia Eyes: no blurry vision, no xerophthalmia ENT: no sore throat, no nodules palpated in throat, no dysphagia/odynophagia, no hoarseness Cardiovascular: no chest pain, no shortness of breath, no palpitations, no leg swelling Respiratory: no cough, no shortness of breath Gastrointestinal: no nausea/vomiting/diarrhea Musculoskeletal: no muscle/joint aches Skin: no rashes, no hyperemia Neurological: no tremors, no numbness, no tingling, no dizziness Psychiatric: no depression, no anxiety   Objective:     BP 111/72    Pulse 91     Ht $R'6\' 1"'LW$  (1.854 m)    Wt 205 lb (93 kg)    BMI 27.05 kg/m   Wt Readings from Last 3 Encounters:  09/17/21 205 lb (93 kg)  08/19/21 204 lb 9.6 oz (92.8 kg)  05/08/21 203 lb (92.1 kg)     BP Readings from Last 3 Encounters:  09/17/21 111/72  08/19/21 130/80  05/08/21 122/88      Physical Exam- Limited  Constitutional:  Body mass index is 27.05 kg/m. , not in acute distress, normal state of mind Eyes:  EOMI, no exophthalmos Neck: Supple Cardiovascular: RRR, no murmurs, rubs, or gallops, no edema Respiratory: Adequate breathing efforts, no crackles, rales, rhonchi, or wheezing Musculoskeletal: no gross deformities, strength intact in all four extremities, no gross restriction of joint movements Skin:  no rashes, no hyperemia Neurological: no tremor with outstretched hands    CMP ( most recent) CMP     Component Value Date/Time   NA 140 12/23/2020 0837   K 4.3 12/23/2020 0837   CL 100 12/23/2020 0837   CO2 24 12/23/2020 0837   GLUCOSE 216 (H) 12/23/2020 0837   GLUCOSE 161 (H) 12/12/2019 0302   BUN 13 12/23/2020 0837   CREATININE 0.99 12/23/2020 0837   CREATININE 0.78 04/18/2014 0719   CALCIUM 9.7 12/23/2020 0837   PROT 6.4 12/23/2020 0837   ALBUMIN 3.9 12/23/2020 0837   AST 23 12/23/2020 0837   ALT 24 12/23/2020 0837   ALKPHOS 87 12/23/2020 0837   BILITOT 0.7 12/23/2020 0837   GFRNONAA 88 06/11/2020 1204   GFRAA 101 06/11/2020 1204     Diabetic Labs (most recent): Lab Results  Component Value Date   HGBA1C 7.8 09/17/2021   HGBA1C 7.1 (A) 05/08/2021   HGBA1C 9.5 (H) 12/23/2020     Lipid Panel ( most recent) Lipid Panel     Component Value Date/Time   CHOL 133 12/23/2020 0837   TRIG 194 (H) 12/23/2020 0837   HDL 36 (L) 12/23/2020 0837   CHOLHDL 3.7 12/23/2020 0837   CHOLHDL 5.2 04/18/2014 0719   VLDL 41 (H) 04/18/2014 0719   LDLCALC 65 12/23/2020 0837   LABVLDL 32 12/23/2020 0837      Lab Results  Component Value Date   TSH 3.940 12/23/2020            Assessment & Plan:   1) Type 2 diabetes mellitus without complication, without long-term current use of insulin ([place])  He presents today with his meter  and logs showing above target fasting glycemic profile.  His POCT A1c today is 7.8%, increasing from last visit of 7.1%.  He recently went on a cruise and has also indulged in holiday sweets which have impacted his readings.  He has plans to get back on track.  He denies any hypoglycemia.  - [name] [name] has currently uncontrolled symptomatic type 2 DM since 60 years of age.   -Recent labs reviewed.  - I had a long discussion with him about the progressive nature of diabetes and the pathology behind its complications. -his diabetes is complicated by neuropathy and mild CKD and he remains at a high risk for more acute and chronic complications which include CAD, CVA, CKD, retinopathy, and neuropathy. These are all discussed in detail with him.  - Nutritional counseling repeated at each appointment due to patients tendency to fall back in to old habits.  - The patient admits there is a room for improvement in their diet and drink choices. -  Suggestion is made for the patient to avoid simple carbohydrates from their diet including Cakes, Sweet Desserts / Pastries, Ice Cream, Soda (diet and regular), Sweet Tea, Candies, Chips, Cookies, Sweet Pastries, Store Bought Juices, Alcohol in Excess of 1-2 drinks a day, Artificial Sweeteners, Coffee Creamer, and "Sugar-free" Products. This will help patient to have stable blood glucose profile and potentially avoid unintended weight gain.   - I encouraged the patient to switch to unprocessed or minimally processed complex starch and increased protein intake (animal or plant source), fruits, and vegetables.   - Patient is advised to stick to a routine mealtimes to eat 3 meals a day and avoid unnecessary snacks (to snack only to correct hypoglycemia).  - he will be scheduled with Jearld Fenton,  RDN, CDE for diabetes education.  - I have approached him with the following individualized plan to manage  his diabetes and patient agrees:   -Although his A1c is worse, I do not recommend changing his medication regimen at this time.   He can continue Metformin 1000 mg po twice daily with meals and Glipizide 5 mg po twice daily with meals.  He is advised to work harder on diet choices and exercise to regain control now that the holidays are coming to a close.  -he is encouraged to continue monitoring blood glucose at least once daily, before breakfast and to call the clinic if he has readings less than 90 or greater than 200 for 3 tests in a row.  - Adjustment parameters are given to him for hypo and hyperglycemia in writing.  - he will be considered for incretin therapy as appropriate next visit.  - Specific targets for  A1c;  LDL, HDL,  and Triglycerides were discussed with the patient.  2) Blood Pressure /Hypertension:  his blood pressure is controlled to target.   he is advised to continue his current medications including Losartan 50 mg p.o. daily with breakfast.  3) Lipids/Hyperlipidemia:    Review of his recent lipid panel from 12/23/20 showed controlled  LDL at 65 and elevated triglycerides of 194 .  he  is advised to continue Lipitor 20 mg daily at bedtime.  Side effects and precautions discussed with him.  He is also encouraged to avoid fried foods and butter.  Will recheck lipid panel prior to next visit.  4)  Weight/Diet:  his Body mass index is 27.05 kg/m.  -   he is a candidate for some weight loss. I discussed with him  the fact that loss of 5 - 10% of his  current body weight will have the most impact on his diabetes management.  Exercise, and detailed carbohydrates information provided  -  detailed on discharge instructions.  5) Chronic Care/Health Maintenance: -he is on ACEI/ARB and Statin medications and is encouraged to initiate and continue to follow up with  Ophthalmology, Dentist, Podiatrist at least yearly or according to recommendations, and advised to stay away from smoking. I have recommended yearly flu vaccine and pneumonia vaccine at least every 5 years; moderate intensity exercise for up to 150 minutes weekly; and sleep for at least 7 hours a day.    - he is advised to maintain close follow up with [name], DO for primary care needs, as well as his other providers for optimal and coordinated care.     I spent 30 minutes in the care of the patient today including review of labs from [place], Lipids, Thyroid Function, Hematology (current and previous including abstractions from other facilities); face-to-face time discussing  his blood glucose readings/logs, discussing hypoglycemia and hyperglycemia episodes and symptoms, medications doses, his options of short and long term treatment based on the latest standards of care / guidelines;  discussion about incorporating lifestyle medicine;  and documenting the encounter.    Please refer to Patient Instructions for Blood Glucose Monitoring and Insulin/Medications Dosing Guide"  in media tab for additional information. Please  also refer to " Patient Self Inventory" in the Media  tab for reviewed elements of pertinent patient history.  [name] [name] participated in the discussions, expressed understanding, and voiced agreement with the above plans.  All questions were answered to his satisfaction. he is encouraged to contact clinic should he have any questions or concerns prior to his return visit.   Follow up plan: - Return in about 4 months (around 01/16/2022) for Diabetes F/U- A1c and UM in office, Previsit labs, Bring meter and logs.  Rayetta Pigg, [facility] [address] [place], [place] [address] Phone: [phone] Fax: [phone]  09/17/2021, 12:00 PM

## 2021-10-02 LAB — HM DIABETES EYE EXAM

## 2021-10-06 ENCOUNTER — Encounter: Payer: Self-pay | Admitting: Nurse Practitioner

## 2021-10-22 ENCOUNTER — Other Ambulatory Visit: Payer: Self-pay | Admitting: Family Medicine

## 2021-10-22 DIAGNOSIS — G4700 Insomnia, unspecified: Secondary | ICD-10-CM

## 2021-10-22 DIAGNOSIS — M545 Low back pain, unspecified: Secondary | ICD-10-CM

## 2021-10-28 ENCOUNTER — Other Ambulatory Visit: Payer: Self-pay

## 2021-10-28 ENCOUNTER — Other Ambulatory Visit: Payer: Self-pay | Admitting: Family Medicine

## 2021-10-28 ENCOUNTER — Ambulatory Visit (INDEPENDENT_AMBULATORY_CARE_PROVIDER_SITE_OTHER): Payer: BC Managed Care – PPO | Admitting: Family Medicine

## 2021-10-28 ENCOUNTER — Telehealth: Payer: Self-pay | Admitting: Family Medicine

## 2021-10-28 VITALS — BP 115/81 | HR 87 | Temp 97.9°F | Ht 73.0 in | Wt 203.0 lb

## 2021-10-28 DIAGNOSIS — J069 Acute upper respiratory infection, unspecified: Secondary | ICD-10-CM | POA: Insufficient documentation

## 2021-10-28 MED ORDER — PROMETHAZINE-CODEINE 6.25-10 MG/5ML PO SYRP: 5.0000 mL | ORAL_SOLUTION | Freq: Four times a day (QID) | ORAL | 0 refills | Status: DC | PRN

## 2021-10-28 NOTE — Telephone Encounter (Signed)
Pt contacted and is aware 

## 2021-10-28 NOTE — Progress Notes (Signed)
Subjective:  [name], male    DOB: January 10, 1961  Age: 61 y.o. MRN: [id number]  CC: Chief Complaint  Patient presents with   Cough    Cough is worse at night. S/S for about 1 week. Patient would like cough medication with codeine.   Nasal Congestion    HPI:  61 year old presents for evaluation of the above.  Patient states that he has had symptoms for at least the past week.  He states that he feels fine during the day but is quite symptomatic at night.  He is bothered most by cough.  He has been using NyQuil and a night cap without resolution.  Also reports congestion.  No fever.  No other associated symptoms.  No other complaints.  Patient Active Problem List   Diagnosis Date Noted   URI with cough and congestion 10/28/2021   Nocturia associated with benign prostatic hyperplasia 12/20/2020   Osteoarthritis of left knee 12/11/2019   Hyperlipidemia 06/20/2018   Erectile dysfunction 12/16/2016   Essential hypertension 06/19/2016   Type 2 diabetes mellitus ([facility]) 03/20/2015   Esophageal reflux 06/25/2014    Social Hx   Social History   Socioeconomic History   Marital status: Married    Spouse name: Not on file   Number of children: Not on file   Years of education: Not on file   Highest education level: Not on file  Occupational History   Not on file  Tobacco Use   Smoking status: Never   Smokeless tobacco: Never  Vaping Use   Vaping Use: Never used  Substance and Sexual Activity   Alcohol use: No   Drug use: No   Sexual activity: Never  Other Topics Concern   Not on file  Social History Narrative   Not on file   Social Determinants of Health   Financial Resource Strain: Not on file  Food Insecurity: Not on file  Transportation Needs: Not on file  Physical Activity: Not on file  Stress: Not on file  Social Connections: Not on file    Review of Systems Per HPI  Objective:  BP 115/81    Pulse 87    Temp 97.9 F (36.6 C) (Oral)    Ht 6'  1" (1.854 m)    Wt 203 lb (92.1 kg)    SpO2 99%    BMI 26.78 kg/m   BP/Weight 10/28/2021 09/17/2021 08/19/2021  Systolic BP 115 111 130  Diastolic BP 81 72 80  Wt. (Lbs) 203 205 204.6  BMI 26.78 27.05 26.99    Physical Exam Vitals and nursing note reviewed.  Constitutional:      General: He is not in acute distress.    Appearance: Normal appearance. He is not ill-appearing.  HENT:     Head: Normocephalic and atraumatic.     Mouth/Throat:     Pharynx: Oropharynx is clear.  Eyes:     General:        Right eye: No discharge.        Left eye: No discharge.     Conjunctiva/sclera: Conjunctivae normal.  Cardiovascular:     Rate and Rhythm: Normal rate and regular rhythm.  Pulmonary:     Effort: Pulmonary effort is normal.     Comments: Faint wheezing heard initially and then cleared. Neurological:     Mental Status: He is alert.  Psychiatric:        Mood and Affect: Mood normal.        Behavior:  Behavior normal.    Lab Results  Component Value Date   WBC 6.8 12/23/2020   HGB 12.1 (L) 12/23/2020   HCT 38.0 12/23/2020   PLT 197 06/11/2020   GLUCOSE 216 (H) 12/23/2020   CHOL 133 12/23/2020   TRIG 194 (H) 12/23/2020   HDL 36 (L) 12/23/2020   LDLCALC 65 12/23/2020   ALT 24 12/23/2020   AST 23 12/23/2020   NA 140 12/23/2020   K 4.3 12/23/2020   CL 100 12/23/2020   CREATININE 0.99 12/23/2020   BUN 13 12/23/2020   CO2 24 12/23/2020   TSH 3.940 12/23/2020   PSA 0.79 04/18/2014   INR 1.2 12/04/2019   HGBA1C 7.8 09/17/2021     Assessment & Plan:   Problem List Items Addressed This Visit       Respiratory   URI with cough and congestion - Primary    Likely viral in origin.  He is primarily bothered by the cough.  Treating with promethazine with codeine.  If continues to persist and does not improve with symptomatic treatment will place on antibiotic therapy.       Meds ordered this encounter  Medications   promethazine-codeine (PHENERGAN WITH CODEINE) 6.25-10  MG/5ML syrup    Sig: Take 5 mLs by mouth every 6 (six) hours as needed for cough.    Dispense:  180 mL    Refill:  0    Bayyinah Dukeman DO [facility]

## 2021-10-28 NOTE — Patient Instructions (Signed)
Medication as prescribed.  If symptoms persist, please let me know.  Take care  Dr. Lacinda Axon

## 2021-10-28 NOTE — Telephone Encounter (Signed)
Pt called [facility] and they have the cough medicine with codeine in it in stock. Please send RX to there.   [phone]

## 2021-10-28 NOTE — Assessment & Plan Note (Signed)
Likely viral in origin.  He is primarily bothered by the cough.  Treating with promethazine with codeine.  If continues to persist and does not improve with symptomatic treatment will place on antibiotic therapy.

## 2021-10-28 NOTE — Telephone Encounter (Signed)
Please advise. Thank you

## 2021-11-20 ENCOUNTER — Other Ambulatory Visit: Payer: Self-pay | Admitting: Family Medicine

## 2021-12-23 ENCOUNTER — Other Ambulatory Visit: Payer: Self-pay | Admitting: Family Medicine

## 2022-01-15 DIAGNOSIS — E119 Type 2 diabetes mellitus without complications: Secondary | ICD-10-CM | POA: Diagnosis not present

## 2022-01-16 LAB — COMPREHENSIVE METABOLIC PANEL
ALT: 18 IU/L (ref 0–44)
AST: 19 IU/L (ref 0–40)
Albumin/Globulin Ratio: 1.8 (ref 1.2–2.2)
Albumin: 4 g/dL (ref 3.8–4.8)
Alkaline Phosphatase: 73 IU/L (ref 44–121)
BUN/Creatinine Ratio: 15 (ref 10–24)
BUN: 17 mg/dL (ref 8–27)
Bilirubin Total: 1.2 mg/dL (ref 0.0–1.2)
CO2: 22 mmol/L (ref 20–29)
Calcium: 9.7 mg/dL (ref 8.6–10.2)
Chloride: 103 mmol/L (ref 96–106)
Creatinine, Ser: 1.1 mg/dL (ref 0.76–1.27)
Globulin, Total: 2.2 g/dL (ref 1.5–4.5)
Glucose: 179 mg/dL — ABNORMAL HIGH (ref 70–99)
Potassium: 4.1 mmol/L (ref 3.5–5.2)
Sodium: 139 mmol/L (ref 134–144)
Total Protein: 6.2 g/dL (ref 6.0–8.5)
eGFR: 76 mL/min/{1.73_m2} (ref >59)

## 2022-01-16 LAB — LIPID PANEL
Chol/HDL Ratio: 3.1 ratio (ref 0.0–5.0)
Cholesterol, Total: 107 mg/dL (ref 100–199)
HDL: 35 mg/dL — ABNORMAL LOW (ref >39)
LDL Chol Calc (NIH): 50 mg/dL (ref 0–99)
Triglycerides: 123 mg/dL (ref 0–149)
VLDL Cholesterol Cal: 22 mg/dL (ref 5–40)

## 2022-01-16 LAB — TSH: TSH: 2.76 u[IU]/mL (ref 0.450–4.500)

## 2022-01-16 LAB — VITAMIN D 25 HYDROXY (VIT D DEFICIENCY, FRACTURES): Vit D, 25-Hydroxy: 16.5 ng/mL — ABNORMAL LOW (ref 30.0–100.0)

## 2022-01-16 LAB — T4, FREE: Free T4: 1.2 ng/dL (ref 0.82–1.77)

## 2022-01-19 ENCOUNTER — Ambulatory Visit: Payer: BC Managed Care – PPO | Admitting: Nurse Practitioner

## 2022-01-19 ENCOUNTER — Encounter: Payer: Self-pay | Admitting: Nurse Practitioner

## 2022-01-19 VITALS — BP 111/74 | HR 94 | Ht 73.0 in | Wt 203.0 lb

## 2022-01-19 DIAGNOSIS — I10 Essential (primary) hypertension: Secondary | ICD-10-CM

## 2022-01-19 DIAGNOSIS — E119 Type 2 diabetes mellitus without complications: Secondary | ICD-10-CM | POA: Diagnosis not present

## 2022-01-19 DIAGNOSIS — E782 Mixed hyperlipidemia: Secondary | ICD-10-CM | POA: Diagnosis not present

## 2022-01-19 LAB — POCT GLYCOSYLATED HEMOGLOBIN (HGB A1C): HbA1c POC (<> result, manual entry): 7.5 % (ref 4.0–5.6)

## 2022-01-19 MED ORDER — GLIPIZIDE 5 MG PO TABS: 5.0000 mg | ORAL_TABLET | Freq: Two times a day (BID) | ORAL | 3 refills | Status: DC

## 2022-01-19 MED ORDER — VITAMIN D (ERGOCALCIFEROL) 1.25 MG (50000 UNIT) PO CAPS: 50000.0000 [IU] | ORAL_CAPSULE | ORAL | 0 refills | Status: DC

## 2022-01-19 MED ORDER — METFORMIN HCL 500 MG PO TABS: 1000.0000 mg | ORAL_TABLET | Freq: Two times a day (BID) | ORAL | 3 refills | Status: DC

## 2022-01-19 NOTE — Patient Instructions (Signed)
Diabetes Mellitus and Foot Care Foot care is an important part of your health, especially when you have diabetes. Diabetes may cause you to have problems because of poor blood flow (circulation) to your feet and legs, which can cause your skin to: Become thinner and drier. Break more easily. Heal more slowly. Peel and crack. You may also have nerve damage (neuropathy) in your legs and feet, causing decreased feeling in them. This means that you may not notice minor injuries to your feet that could lead to more serious problems. Noticing and addressing any potential problems early is the best way to prevent future foot problems. How to care for your feet Foot hygiene  Wash your feet daily with warm water and mild soap. Do not use hot water. Then, pat your feet and the areas between your toes until they are completely dry. Do not soak your feet as this can dry your skin. Trim your toenails straight across. Do not dig under them or around the cuticle. File the edges of your nails with an emery board or nail file. Apply a moisturizing lotion or petroleum jelly to the skin on your feet and to dry, brittle toenails. Use lotion that does not contain alcohol and is unscented. Do not apply lotion between your toes. Shoes and socks Wear clean socks or stockings every day. Make sure they are not too tight. Do not wear knee-high stockings since they may decrease blood flow to your legs. Wear shoes that fit properly and have enough cushioning. Always look in your shoes before you put them on to be sure there are no objects inside. To break in new shoes, wear them for just a few hours a day. This prevents injuries on your feet. Wounds, scrapes, corns, and calluses  Check your feet daily for blisters, cuts, bruises, sores, and redness. If you cannot see the bottom of your feet, use a mirror or ask someone for help. Do not cut corns or calluses or try to remove them with medicine. If you find a minor scrape,  cut, or break in the skin on your feet, keep it and the skin around it clean and dry. You may clean these areas with mild soap and water. Do not clean the area with peroxide, alcohol, or iodine. If you have a wound, scrape, corn, or callus on your foot, look at it several times a day to make sure it is healing and not infected. Check for: Redness, swelling, or pain. Fluid or blood. Warmth. Pus or a bad smell. General tips Do not cross your legs. This may decrease blood flow to your feet. Do not use heating pads or hot water bottles on your feet. They may burn your skin. If you have lost feeling in your feet or legs, you may not know this is happening until it is too late. Protect your feet from hot and cold by wearing shoes, such as at the beach or on hot pavement. Schedule a complete foot exam at least once a year (annually) or more often if you have foot problems. Report any cuts, sores, or bruises to your health care provider immediately. Where to find more information American Diabetes Association: www.diabetes.org Association of Diabetes Care & Education Specialists: www.diabeteseducator.org Contact a health care provider if: You have a medical condition that increases your risk of infection and you have any cuts, sores, or bruises on your feet. You have an injury that is not healing. You have redness on your legs or feet. You   feel burning or tingling in your legs or feet. You have pain or cramps in your legs and feet. Your legs or feet are numb. Your feet always feel cold. You have pain around any toenails. Get help right away if: You have a wound, scrape, corn, or callus on your foot and: You have pain, swelling, or redness that gets worse. You have fluid or blood coming from the wound, scrape, corn, or callus. Your wound, scrape, corn, or callus feels warm to the touch. You have pus or a bad smell coming from the wound, scrape, corn, or callus. You have a fever. You have a red  line going up your leg. Summary Check your feet every day for blisters, cuts, bruises, sores, and redness. Apply a moisturizing lotion or petroleum jelly to the skin on your feet and to dry, brittle toenails. Wear shoes that fit properly and have enough cushioning. If you have foot problems, report any cuts, sores, or bruises to your health care provider immediately. Schedule a complete foot exam at least once a year (annually) or more often if you have foot problems. This information is not intended to replace advice given to you by your health care provider. Make sure you discuss any questions you have with your health care provider. Document Revised: 03/28/2020 Document Reviewed: 03/28/2020 Elsevier Patient Education  2023 Elsevier Inc.  

## 2022-01-19 NOTE — Progress Notes (Signed)
? ?                                                    Endocrinology Follow Up Note  ?     01/19/2022, 12:17 PM ? ? ?Subjective:  ? ? [name], male    DOB: 06-May-1961.  ?[name] [name] is being seen in follow up after being seen in consultation for management of currently uncontrolled symptomatic diabetes requested by  [name], DO. ? ? ?Past Medical History:  ?Diagnosis Date  ? Anxiety   ? Arthritis   ? Diabetes mellitus without complication ([place])   ? Diabetes mellitus, type II ([place])   ? Diverticulosis   ? pt unaware  ? ED (erectile dysfunction)   ? Fatty liver   ? GERD (gastroesophageal reflux disease)   ? History of COVID-19 07/2019  ? History of left inguinal hernia   ? History of retinal detachment   ? Right  ? Hyperlipidemia   ? Hypertension   ? Insomnia   ? Low ferritin level   ? Numbness and tingling of both feet   ? Shortness of breath dyspnea   ? since having COVID 07/2019 with exertions  ? ? ?Past Surgical History:  ?Procedure Laterality Date  ? CHOLECYSTECTOMY    ? COLONOSCOPY N/A 11/30/2014  ? Procedure: COLONOSCOPY;  Surgeon: Daneil Dolin, MD;  Location: [facility];  Service: Endoscopy;  Laterality: N/A;  7:30 Am  ? ESOPHAGEAL DILATION    ? HERNIA REPAIR    ? 1981  ? RETINAL DETACHMENT SURGERY Right   ? TOTAL KNEE ARTHROPLASTY Left 12/11/2019  ? Procedure: TOTAL KNEE ARTHROPLASTY;  Surgeon: Gaynelle Arabian, MD;  Location: [facility];  Service: Orthopedics;  Laterality: Left;  63mn  ? VASCULAR SURGERY    ? ? ?Social History  ? ?Socioeconomic History  ? Marital status: Married  ?  Spouse name: Not on file  ? Number of children: Not on file  ? Years of education: Not on file  ? Highest education level: Not on file  ?Occupational History  ? Not on file  ?Tobacco Use  ? Smoking status: Never  ? Smokeless tobacco: Never  ?Vaping Use  ? Vaping Use: Never used  ?Substance and Sexual Activity  ? Alcohol use: No  ? Drug use: No  ? Sexual activity: Never  ?Other Topics  Concern  ? Not on file  ?Social History Narrative  ? Not on file  ? ?Social Determinants of Health  ? ?Financial Resource Strain: Not on file  ?Food Insecurity: Not on file  ?Transportation Needs: Not on file  ?Physical Activity: Not on file  ?Stress: Not on file  ?Social Connections: Not on file  ? ? ?History reviewed. No pertinent family history. ? ?Outpatient Encounter Medications as of 01/19/2022  ?Medication Sig  ? Vitamin D, Ergocalciferol, (DRISDOL) 1.25 MG (50000 UNIT) CAPS capsule Take 1 capsule (50,000 Units total) by mouth every 7 (seven) days.  ? acetaminophen (TYLENOL) 500 MG tablet Take 500 mg by mouth every 6 (six) hours as needed.  ? atorvastatin (LIPITOR) 20 MG tablet TAKE 1 TABLET BY MOUTH AT BEDTIME.  ? blood glucose meter kit and supplies KIT Dispense based on patient and insurance preference. Test sugar up to one time a day  ? CONTOUR NEXT TEST  test strip USE TO TEST BLOOD SUGAR ONCE DAILY.  ? dutasteride (AVODART) 0.5 MG capsule TAKE 1 CAPSULE BY MOUTH ONCE DAILY.  ? fluticasone (FLONASE) 50 MCG/ACT nasal spray USE 2 SPRAYS IN EACH NOSTRIL EVERY DAY, AS NEEDED.  ? glipiZIDE (GLUCOTROL) 5 MG tablet Take 1 tablet (5 mg total) by mouth 2 (two) times daily before a meal.  ? LORazepam (ATIVAN) 0.5 MG tablet Take 1 tablet (0.5 mg total) by mouth at bedtime as needed for anxiety or sleep.  ? losartan (COZAAR) 50 MG tablet TAKE 1 TABLET BY MOUTH ONCE DAILY.  ? metFORMIN (GLUCOPHAGE) 500 MG tablet Take 2 tablets (1,000 mg total) by mouth 2 (two) times daily with a meal.  ? omeprazole (PRILOSEC) 20 MG capsule TAKE 1 CAPSULE 2 TIMES A DAY BEFORE MEALS.  ? ONETOUCH DELICA LANCETS 62M MISC   ? promethazine-codeine (PHENERGAN WITH CODEINE) 6.25-10 MG/5ML syrup Take 5 mLs by mouth every 6 (six) hours as needed for cough.  ? tamsulosin (FLOMAX) 0.4 MG CAPS capsule TAKE 2 CAPSULES NIGHTLY.  ? traMADol (ULTRAM) 50 MG tablet TAKE 1 TABLET BY MOUTH EVERY 12 HOURS AS NEEDED FOR PAIN.  ? [DISCONTINUED] glipiZIDE  (GLUCOTROL) 5 MG tablet Take 1 tablet (5 mg total) by mouth 2 (two) times daily before a meal.  ? [DISCONTINUED] metFORMIN (GLUCOPHAGE) 500 MG tablet TAKE 2 TABLETS BY MOUTH TWICE DAILY. (Patient taking differently: Take 500 mg by mouth 2 (two) times daily with a meal.)  ? ?No facility-administered encounter medications on file as of 01/19/2022.  ? ? ?ALLERGIES: ?Allergies  ?Allergen Reactions  ? Dilaudid [Hydromorphone Hcl] Other (See Comments)  ?  PT SAID HE PASSED OUT WHEN HE TOOK IT  ? ? ?VACCINATION STATUS: ?Immunization History  ?Administered Date(s) Administered  ? Influenza, Seasonal, Injecte, Preservative Fre 06/19/2016  ? Influenza,inj,Quad PF,6+ Mos 06/17/2017, 06/20/2018, 07/12/2019, 07/19/2020, 08/19/2021  ? ? ?Diabetes ?He presents for his follow-up diabetic visit. He has type 2 diabetes mellitus. Onset time: Diagnosed at approx age of 41. His disease course has been stable. There are no hypoglycemic associated symptoms. Pertinent negatives for diabetes include no fatigue, no foot paresthesias and no polydipsia. There are no hypoglycemic complications. Symptoms are stable. Diabetic complications include impotence, nephropathy and peripheral neuropathy. Risk factors for coronary artery disease include diabetes mellitus, dyslipidemia, family history, male sex and hypertension. Current diabetic treatment includes oral agent (dual therapy). He is compliant with treatment most of the time. His weight is fluctuating minimally. He is following a generally healthy diet. When asked about meal planning, he reported none. He has not had a previous visit with a dietitian. He participates in exercise intermittently. His home blood glucose trend is fluctuating minimally. His breakfast blood glucose range is generally 140-180 mg/dl. His overall blood glucose range is 140-180 mg/dl. (He presents today with his meter and logs showing slightly above target fasting readings.  His POCT A1c today is 7.5%, improving from last  visit of 7.8%.  He denies any hypoglycemia.  He admits to drinking tea with lunch and a half a sprite after dinner at night.  He does snack on peanuts between meals when hungry.  Analysis of his meter shows 14-day average of 156.) An ACE inhibitor/angiotensin II receptor blocker is being taken. He does not see a podiatrist.Eye exam is current.  ?Hyperlipidemia ?This is a chronic problem. The current episode started more than 1 year ago. The problem is uncontrolled. Recent lipid tests were reviewed and are variable. Exacerbating diseases include chronic  renal disease, diabetes and liver disease. fatty liver. Factors aggravating his hyperlipidemia include fatty foods. Current antihyperlipidemic treatment includes statins and diet change. The current treatment provides mild improvement of lipids. Compliance problems include adherence to diet and adherence to exercise.  Risk factors for coronary artery disease include diabetes mellitus, dyslipidemia, family history, male sex, obesity and hypertension.  ?Hypertension ?This is a chronic problem. The current episode started more than 1 year ago. The problem has been resolved since onset. The problem is controlled. There are no associated agents to hypertension. Risk factors for coronary artery disease include dyslipidemia, diabetes mellitus, family history and male gender. Past treatments include angiotensin blockers. The current treatment provides moderate improvement. There are no compliance problems.  Hypertensive end-organ damage includes kidney disease. Identifiable causes of hypertension include chronic renal disease.  ? ?Review of systems ? ?Constitutional: + Minimally fluctuating body weight,  current Body mass index is 26.78 kg/m?. , no fatigue, no subjective hyperthermia, no subjective hypothermia ?Eyes: no blurry vision, no xerophthalmia ?ENT: no sore throat, no nodules palpated in throat, no dysphagia/odynophagia, no hoarseness ?Cardiovascular: no chest pain,  no shortness of breath, no palpitations, no leg swelling ?Respiratory: no cough, no shortness of breath ?Gastrointestinal: no nausea/vomiting/diarrhea ?Musculoskeletal: no muscle/joint aches ?Skin: no rashes, no

## 2022-01-20 ENCOUNTER — Other Ambulatory Visit: Payer: Self-pay | Admitting: Family Medicine

## 2022-02-09 ENCOUNTER — Other Ambulatory Visit: Payer: Self-pay | Admitting: Family Medicine

## 2022-02-13 ENCOUNTER — Other Ambulatory Visit: Payer: Self-pay | Admitting: Family Medicine

## 2022-02-13 DIAGNOSIS — G4700 Insomnia, unspecified: Secondary | ICD-10-CM

## 2022-02-17 ENCOUNTER — Encounter: Payer: Self-pay | Admitting: Family Medicine

## 2022-02-17 ENCOUNTER — Ambulatory Visit: Payer: BC Managed Care – PPO | Admitting: Family Medicine

## 2022-02-17 VITALS — BP 126/74 | HR 85 | Temp 97.6°F | Wt 206.0 lb

## 2022-02-17 DIAGNOSIS — R351 Nocturia: Secondary | ICD-10-CM

## 2022-02-17 DIAGNOSIS — E785 Hyperlipidemia, unspecified: Secondary | ICD-10-CM

## 2022-02-17 DIAGNOSIS — E119 Type 2 diabetes mellitus without complications: Secondary | ICD-10-CM

## 2022-02-17 DIAGNOSIS — R0989 Other specified symptoms and signs involving the circulatory and respiratory systems: Secondary | ICD-10-CM

## 2022-02-17 DIAGNOSIS — D649 Anemia, unspecified: Secondary | ICD-10-CM | POA: Diagnosis not present

## 2022-02-17 DIAGNOSIS — N401 Enlarged prostate with lower urinary tract symptoms: Secondary | ICD-10-CM | POA: Diagnosis not present

## 2022-02-17 DIAGNOSIS — Z862 Personal history of diseases of the blood and blood-forming organs and certain disorders involving the immune mechanism: Secondary | ICD-10-CM

## 2022-02-17 DIAGNOSIS — I10 Essential (primary) hypertension: Secondary | ICD-10-CM

## 2022-02-17 NOTE — Patient Instructions (Signed)
Labs today to assess hemoglobin/iron, etc.  We will in contact regarding an [place] of your legs to look at the blood flow.  Follow up in 6 months.  Take care  Dr. Lacinda Axon

## 2022-02-18 LAB — IRON,TIBC AND FERRITIN PANEL
Ferritin: 17 ng/mL — ABNORMAL LOW (ref 30–400)
Iron Saturation: 17 % (ref 15–55)
Iron: 62 ug/dL (ref 38–169)
Total Iron Binding Capacity: 355 ug/dL (ref 250–450)
UIBC: 293 ug/dL (ref 111–343)

## 2022-02-18 LAB — CBC
Hematocrit: 40.4 % (ref 37.5–51.0)
Hemoglobin: 13.1 g/dL (ref 13.0–17.7)
MCH: 28.1 pg (ref 26.6–33.0)
MCHC: 32.4 g/dL (ref 31.5–35.7)
MCV: 87 fL (ref 79–97)
Platelets: 188 10*3/uL (ref 150–450)
RBC: 4.67 x10E6/uL (ref 4.14–5.80)
RDW: 13.9 % (ref 11.6–15.4)
WBC: 8.8 10*3/uL (ref 3.4–10.8)

## 2022-02-18 LAB — FOLATE: Folate: 10.3 ng/mL (ref >3.0)

## 2022-02-18 LAB — VITAMIN B12: Vitamin B-12: 384 pg/mL (ref 232–1245)

## 2022-02-18 NOTE — Assessment & Plan Note (Signed)
Well-controlled.  Continue Lipitor. 

## 2022-02-18 NOTE — Progress Notes (Signed)
Subjective:  [name], male    DOB: May 25, 1961  Age: 61 y.o. MRN: [id number]  CC: Chief Complaint  Patient presents with   Hypertension    Endocrinologist gave pt something for Vit D. Pt still having fatigue-Vit D has helped some. No issues with blood pressure or sugars. Should pt be on something for iron? Pt states when he goes to give blood his iron is to low.    Diabetes    HPI:  61 year old male with hypertension, GERD, type 2 diabetes, BPH, hyperlipidemia presents for follow-up.  Patient's type 2 diabetes is managed by endocrinology.  He is currently on glipizide and metformin.  Last A1c was 7.5.  Hypertension is at goal on losartan.  Patient reports that he was recently informed that his "iron was low" when he tried to give blood.  He would like to discuss this today.  Hyperlipidemia is well controlled on Lipitor.  BPH stable on Flomax and dutasteride.  Patient Active Problem List   Diagnosis Date Noted   Nocturia associated with benign prostatic hyperplasia 12/20/2020   Osteoarthritis of left knee 12/11/2019   Hyperlipidemia 06/20/2018   Erectile dysfunction 12/16/2016   Essential hypertension 06/19/2016   Type 2 diabetes mellitus (HCC) 03/20/2015   Esophageal reflux 06/25/2014    Social Hx   Social History   Socioeconomic History   Marital status: Married    Spouse name: Not on file   Number of children: Not on file   Years of education: Not on file   Highest education level: Not on file  Occupational History   Not on file  Tobacco Use   Smoking status: Never   Smokeless tobacco: Never  Vaping Use   Vaping Use: Never used  Substance and Sexual Activity   Alcohol use: No   Drug use: No   Sexual activity: Never  Other Topics Concern   Not on file  Social History Narrative   Not on file   Social Determinants of Health   Financial Resource Strain: Not on file  Food Insecurity: Not on file  Transportation Needs: Not on file   Physical Activity: Not on file  Stress: Not on file  Social Connections: Not on file    Review of Systems  Constitutional:  Positive for fatigue.  Respiratory: Negative.    Cardiovascular: Negative.     Objective:  BP 126/74   Pulse 85   Temp 97.6 F (36.4 C)   Wt 206 lb (93.4 kg)   SpO2 97%   BMI 27.18 kg/m      02/17/2022   10:47 AM 01/19/2022   11:33 AM 10/28/2021    2:59 PM  BP/Weight  Systolic BP 126 111 115  Diastolic BP 74 74 81  Wt. (Lbs) 206 203 203  BMI 27.18 kg/m2 26.78 kg/m2 26.78 kg/m2    Physical Exam Constitutional:      General: He is not in acute distress.    Appearance: Normal appearance. He is not ill-appearing.  HENT:     Head: Normocephalic and atraumatic.  Eyes:     General:        Right eye: No discharge.        Left eye: No discharge.     Conjunctiva/sclera: Conjunctivae normal.  Cardiovascular:     Rate and Rhythm: Normal rate and regular rhythm.  Pulmonary:     Effort: Pulmonary effort is normal.     Breath sounds: Normal breath sounds. No wheezing, rhonchi or rales.  Neurological:     Mental Status: He is alert.  Psychiatric:        Mood and Affect: Mood normal.        Behavior: Behavior normal.    Lab Results  Component Value Date   WBC 8.8 02/17/2022   HGB 13.1 02/17/2022   HCT 40.4 02/17/2022   PLT 188 02/17/2022   GLUCOSE 179 (H) 01/15/2022   CHOL 107 01/15/2022   TRIG 123 01/15/2022   HDL 35 (L) 01/15/2022   LDLCALC 50 01/15/2022   ALT 18 01/15/2022   AST 19 01/15/2022   NA 139 01/15/2022   K 4.1 01/15/2022   CL 103 01/15/2022   CREATININE 1.10 01/15/2022   BUN 17 01/15/2022   CO2 22 01/15/2022   TSH 2.760 01/15/2022   PSA 0.79 04/18/2014   INR 1.2 12/04/2019   HGBA1C 7.5 01/19/2022     Assessment & Plan:   Problem List Items Addressed This Visit       Cardiovascular and Mediastinum   Essential hypertension - Primary    Blood pressures well controlled.  Continue losartan.         Endocrine    Type 2 diabetes mellitus (HCC)    A1c elevated at 7.5.  Continue glipizide and metformin.  Continue close follow-up with endocrinology.         Other   Nocturia associated with benign prostatic hyperplasia    Stable on dutasteride and Flomax.       Hyperlipidemia    Well-controlled.  Continue Lipitor.       Other Visit Diagnoses     History of anemia       Relevant Orders   CBC (Completed)   Iron, TIBC and Ferritin Panel (Completed)   Vitamin B12 (Completed)   Folate (Completed)       Follow-up:  Return in about 6 months (around 08/20/2022).  Everlene Other DO [facility]

## 2022-02-18 NOTE — Assessment & Plan Note (Signed)
Stable on dutasteride and Flomax.

## 2022-02-18 NOTE — Assessment & Plan Note (Addendum)
Blood pressures well controlled.  Continue losartan.

## 2022-02-18 NOTE — Assessment & Plan Note (Signed)
A1c elevated at 7.5.  Continue glipizide and metformin.  Continue close follow-up with endocrinology.

## 2022-02-19 ENCOUNTER — Other Ambulatory Visit: Payer: Self-pay

## 2022-02-19 ENCOUNTER — Other Ambulatory Visit: Payer: Self-pay | Admitting: Family Medicine

## 2022-02-19 DIAGNOSIS — E611 Iron deficiency: Secondary | ICD-10-CM

## 2022-02-19 DIAGNOSIS — D508 Other iron deficiency anemias: Secondary | ICD-10-CM

## 2022-02-20 ENCOUNTER — Other Ambulatory Visit: Payer: Self-pay | Admitting: Family Medicine

## 2022-02-24 ENCOUNTER — Other Ambulatory Visit: Payer: Self-pay | Admitting: *Deleted

## 2022-02-24 DIAGNOSIS — E611 Iron deficiency: Secondary | ICD-10-CM

## 2022-02-24 LAB — IFOBT (OCCULT BLOOD): IFOBT: NEGATIVE

## 2022-02-25 ENCOUNTER — Other Ambulatory Visit: Payer: Self-pay

## 2022-02-25 ENCOUNTER — Other Ambulatory Visit: Payer: Self-pay | Admitting: Family Medicine

## 2022-02-25 MED ORDER — IRON (FERROUS SULFATE) 325 (65 FE) MG PO TABS: 325.0000 mg | ORAL_TABLET | ORAL | 3 refills | Status: DC

## 2022-03-17 ENCOUNTER — Other Ambulatory Visit: Payer: Self-pay | Admitting: Family Medicine

## 2022-03-18 ENCOUNTER — Ambulatory Visit (INDEPENDENT_AMBULATORY_CARE_PROVIDER_SITE_OTHER): Payer: BC Managed Care – PPO | Admitting: Internal Medicine

## 2022-03-18 ENCOUNTER — Encounter: Payer: Self-pay | Admitting: Internal Medicine

## 2022-03-18 ENCOUNTER — Telehealth: Payer: Self-pay | Admitting: *Deleted

## 2022-03-18 ENCOUNTER — Other Ambulatory Visit: Payer: Self-pay | Admitting: Family Medicine

## 2022-03-18 VITALS — BP 111/79 | HR 79 | Temp 98.1°F | Ht 73.0 in | Wt 207.1 lb

## 2022-03-18 DIAGNOSIS — R197 Diarrhea, unspecified: Secondary | ICD-10-CM | POA: Diagnosis not present

## 2022-03-18 DIAGNOSIS — K219 Gastro-esophageal reflux disease without esophagitis: Secondary | ICD-10-CM

## 2022-03-18 DIAGNOSIS — E611 Iron deficiency: Secondary | ICD-10-CM

## 2022-03-18 MED ORDER — PEG 3350-KCL-NA BICARB-NACL 420 G PO SOLR: ORAL | 0 refills | Status: DC

## 2022-03-18 NOTE — Patient Instructions (Signed)
We will schedule you for colonoscopy to further evaluate your iron deficiency as well as your intermittent diarrhea.  Continue on omeprazole daily for your chronic reflux.  It was very nice meeting you today.  Dr. Marletta Lor  At [facility] we value your feedback. You may receive a survey about your visit today. Please share your experience as we strive to create trusting relationships with our patients to provide genuine, compassionate, quality care.  We appreciate your understanding and patience as we review any laboratory studies, imaging, and other diagnostic tests that are ordered as we care for you. Our office policy is 5 business days for review of these results, and any emergent or urgent results are addressed in a timely manner for your best interest. If you do not hear from our office in 1 week, please contact us.   We also encourage the use of MyChart, which contains your medical information for your review as well. If you are not enrolled in this feature, an access code is on this after visit summary for your convenience. Thank you for allowing [place] to be involved in your care.  It was great to see you today!  I hope you have a great rest of your Summer!    Hennie Duos. Marletta Lor, D.O. Gastroenterology and Hepatology [facility]

## 2022-03-18 NOTE — Progress Notes (Signed)
Primary Care Physician:  Tommie Sams, DO Primary Gastroenterologist:  Dr. Marletta Lor  Chief Complaint  Patient presents with   Anemia    Patient referred for iron def anemia. Having fatigue. Dizziness when bending over off and on.     HPI:   [name] [name] is a 61 y.o. male who presents to clinic today by referral from his PCP Dr. Adriana Simas for evaluation.  Patient recently had blood work which showed iron deficiency.  Ferritin 17.  Fortunately hemoglobin normal at 13.1.  FOBT negative.  Last colonoscopy 2016 which showed diverticulosis, otherwise unremarkable.  No family history of colorectal malignancy.  No unintentional weight loss.  No abdominal pain.  No melena hematochezia.  Patient does note chronically loose stools.  Notes 3-4 bowel movements primarily in the morning.  Will occasionally have formed stools though primarily mushy.  Does have chronic GERD which is well controlled omeprazole daily.  No dysphagia/odynophagia.  No epigastric or chest pain.  Past Medical History:  Diagnosis Date   Anxiety    Arthritis    Diabetes mellitus without complication (HCC)    Diabetes mellitus, type II (HCC)    Diverticulosis    pt unaware   ED (erectile dysfunction)    Fatty liver    GERD (gastroesophageal reflux disease)    History of COVID-19 07/2019   History of left inguinal hernia    History of retinal detachment    Right   Hyperlipidemia    Hypertension    Insomnia    Low ferritin level    Numbness and tingling of both feet    Shortness of breath dyspnea    since having COVID 07/2019 with exertions    Past Surgical History:  Procedure Laterality Date   CHOLECYSTECTOMY     COLONOSCOPY N/A 11/30/2014   Procedure: COLONOSCOPY;  Surgeon: Corbin Ade, MD;  [facility]: [facility];  Service: Endoscopy;  Laterality: N/A;  7:30 Am   ESOPHAGEAL DILATION     HERNIA REPAIR     1981   RETINAL DETACHMENT SURGERY Right    TOTAL KNEE ARTHROPLASTY Left 12/11/2019   Procedure: TOTAL  KNEE ARTHROPLASTY;  Surgeon: Ollen Gross, MD;  Location: [facility];  Service: Orthopedics;  Laterality: Left;    VASCULAR SURGERY      Current Outpatient Medications  Medication Sig Dispense Refill   acetaminophen (TYLENOL) 500 MG tablet Take 500 mg by mouth every 6 (six) hours as needed.     atorvastatin (LIPITOR) 20 MG tablet TAKE 1 TABLET BY MOUTH AT BEDTIME. 30 tablet 0   blood glucose meter kit and supplies KIT Dispense based on patient and insurance preference. Test sugar up to one time a day 1 each 0   CONTOUR NEXT TEST test strip USE TO TEST BLOOD SUGAR ONCE DAILY. 70 each 2   dutasteride (AVODART) 0.5 MG capsule TAKE 1 CAPSULE BY MOUTH ONCE DAILY. 30 capsule 0   fluticasone (FLONASE) 50 MCG/ACT nasal spray USE 2 SPRAYS IN EACH NOSTRIL EVERY DAY, AS NEEDED. 16 g 0   glipiZIDE (GLUCOTROL) 5 MG tablet Take 1 tablet (5 mg total) by mouth 2 (two) times daily before a meal. 180 tablet 3   Iron, Ferrous Sulfate, 325 (65 Fe) MG TABS Take 325 mg by mouth every other day. 30 tablet 3   LORazepam (ATIVAN) 0.5 MG tablet TAKE 1 TABLET BY MOUTH AT BEDTIME. 30 tablet 3   losartan (COZAAR) 50 MG tablet TAKE 1 TABLET BY MOUTH ONCE  DAILY. 30 tablet 0   metFORMIN (GLUCOPHAGE) 500 MG tablet TAKE 2 TABLETS BY MOUTH TWICE DAILY. 360 tablet 1   omeprazole (PRILOSEC) 20 MG capsule TAKE 1 CAPSULE 2 TIMES A DAY BEFORE MEALS. 60 capsule 1   ONETOUCH DELICA LANCETS 09O MISC   0   tamsulosin (FLOMAX) 0.4 MG CAPS capsule TAKE 2 CAPSULES NIGHTLY. 60 capsule 0   traMADol (ULTRAM) 50 MG tablet TAKE 1 TABLET BY MOUTH EVERY 12 HOURS AS NEEDED FOR PAIN. 60 tablet 3   Vitamin D, Ergocalciferol, (DRISDOL) 1.25 MG (50000 UNIT) CAPS capsule Take 1 capsule (50,000 Units total) by mouth every 7 (seven) days. 12 capsule 0   No current facility-administered medications for this visit.    Allergies as of 03/18/2022 - Review Complete 03/18/2022  Allergen Reaction Noted   Dilaudid [hydromorphone hcl] Other (See  Comments) 10/22/2014    Family History  Problem Relation Age of Onset   Diabetes Mother    Neuropathy Mother     Social History   Socioeconomic History   Marital status: Married    Spouse name: Not on file   Number of children: Not on file   Years of education: Not on file   Highest education level: Not on file  Occupational History   Not on file  Tobacco Use   Smoking status: Never    Passive exposure: Never   Smokeless tobacco: Never  Vaping Use   Vaping Use: Never used  Substance and Sexual Activity   Alcohol use: No   Drug use: No   Sexual activity: Never  Other Topics Concern   Not on file  Social History Narrative   Not on file   Social Determinants of Health   Financial Resource Strain: Not on file  Food Insecurity: Not on file  Transportation Needs: Not on file  Physical Activity: Not on file  Stress: Not on file  Social Connections: Not on file  Intimate Partner Violence: Not on file    Subjective: Review of Systems  Constitutional:  Negative for chills and fever.  HENT:  Negative for congestion and hearing loss.   Eyes:  Negative for blurred vision and double vision.  Respiratory:  Negative for cough and shortness of breath.   Cardiovascular:  Negative for chest pain and palpitations.  Gastrointestinal:  Positive for diarrhea and heartburn. Negative for abdominal pain, blood in stool, constipation, melena and vomiting.  Genitourinary:  Negative for dysuria and urgency.  Musculoskeletal:  Negative for joint pain and myalgias.  Skin:  Negative for itching and rash.  Neurological:  Negative for dizziness and headaches.  Psychiatric/Behavioral:  Negative for depression. The patient is not nervous/anxious.        Objective: BP 111/79 (BP Location: Right Arm, Patient Position: Sitting, Cuff Size: Normal)   Pulse 79   Temp 98.1 F (36.7 C) (Oral)   Ht $R'6\' 1"'RT$  (1.854 m)   Wt 207 lb 1.6 oz (93.9 kg)   BMI 27.32 kg/m  Physical Exam Constitutional:       Appearance: Normal appearance.  HENT:     Head: Normocephalic and atraumatic.  Eyes:     Extraocular Movements: Extraocular movements intact.     Conjunctiva/sclera: Conjunctivae normal.  Cardiovascular:     Rate and Rhythm: Normal rate and regular rhythm.  Pulmonary:     Effort: Pulmonary effort is normal.     Breath sounds: Normal breath sounds.  Abdominal:     General: Bowel sounds are normal.  Palpations: Abdomen is soft.  Musculoskeletal:        General: Normal range of motion.     Cervical back: Normal range of motion and neck supple.  Skin:    General: Skin is warm.  Neurological:     General: No focal deficit present.     Mental Status: He is alert and oriented to person, place, and time.  Psychiatric:        Mood and Affect: Mood normal.        Behavior: Behavior normal.      Assessment: *Iron deficiency *Chronic GERD-well-controlled omeprazole twice daily *Diarrhea-intermittent   Plan: GERD well-controlled on omeprazole twice daily.  We will continue.  No alarm symptoms today to warrant further investigation with upper endoscopy.  In regards to his iron deficiency and intermittent diarrhea, will schedule for colonoscopy.The risks including infection, bleed, or perforation as well as benefits, limitations, alternatives and imponderables have been reviewed with the patient. Questions have been answered. All parties agreeable.  Thank you Dr. Lacinda Axon for the kind referral.  03/18/2022 9:17 AM   Disclaimer: This note was dictated with voice recognition software. Similar sounding words can inadvertently be transcribed and may not be corrected upon review.

## 2022-03-18 NOTE — Telephone Encounter (Signed)
LMVOM to call back to schedule TCS with Dr. Marletta Lor, ASA 2

## 2022-03-18 NOTE — Telephone Encounter (Signed)
Patient returned call. He has been scheduled for 8/1 at 9am. Aware to hold iron x 7 days. Will mail instructions and send prep to pharmacy.

## 2022-04-15 ENCOUNTER — Other Ambulatory Visit: Payer: Self-pay | Admitting: Family Medicine

## 2022-04-18 ENCOUNTER — Other Ambulatory Visit: Payer: Self-pay | Admitting: Family Medicine

## 2022-04-21 ENCOUNTER — Ambulatory Visit (HOSPITAL_COMMUNITY): Payer: BC Managed Care – PPO | Admitting: Anesthesiology

## 2022-04-21 ENCOUNTER — Encounter (HOSPITAL_COMMUNITY)
Admission: RE | Disposition: A | Discharge status: Home / Self Care | Payer: Self-pay | Source: Home / Self Care | Attending: Internal Medicine

## 2022-04-21 ENCOUNTER — Other Ambulatory Visit: Payer: Self-pay

## 2022-04-21 ENCOUNTER — Ambulatory Visit (HOSPITAL_COMMUNITY)
Admission: RE | Admit: 2022-04-21 | Discharge: 2022-04-21 | Disposition: A | Discharge status: Home / Self Care | Payer: BC Managed Care – PPO | Attending: Internal Medicine | Admitting: Internal Medicine

## 2022-04-21 ENCOUNTER — Encounter (HOSPITAL_COMMUNITY): Payer: Self-pay

## 2022-04-21 DIAGNOSIS — Z7984 Long term (current) use of oral hypoglycemic drugs: Secondary | ICD-10-CM | POA: Diagnosis not present

## 2022-04-21 DIAGNOSIS — K5140 Inflammatory polyps of colon without complications: Secondary | ICD-10-CM | POA: Diagnosis not present

## 2022-04-21 DIAGNOSIS — K529 Noninfective gastroenteritis and colitis, unspecified: Secondary | ICD-10-CM | POA: Diagnosis not present

## 2022-04-21 DIAGNOSIS — E119 Type 2 diabetes mellitus without complications: Secondary | ICD-10-CM | POA: Insufficient documentation

## 2022-04-21 DIAGNOSIS — I10 Essential (primary) hypertension: Secondary | ICD-10-CM | POA: Diagnosis not present

## 2022-04-21 DIAGNOSIS — K5730 Diverticulosis of large intestine without perforation or abscess without bleeding: Secondary | ICD-10-CM | POA: Diagnosis not present

## 2022-04-21 DIAGNOSIS — K648 Other hemorrhoids: Secondary | ICD-10-CM | POA: Diagnosis not present

## 2022-04-21 DIAGNOSIS — K219 Gastro-esophageal reflux disease without esophagitis: Secondary | ICD-10-CM | POA: Insufficient documentation

## 2022-04-21 DIAGNOSIS — R197 Diarrhea, unspecified: Secondary | ICD-10-CM

## 2022-04-21 DIAGNOSIS — D509 Iron deficiency anemia, unspecified: Secondary | ICD-10-CM | POA: Diagnosis not present

## 2022-04-21 DIAGNOSIS — K635 Polyp of colon: Secondary | ICD-10-CM

## 2022-04-21 HISTORY — PX: COLONOSCOPY WITH PROPOFOL: SHX5780

## 2022-04-21 HISTORY — PX: BIOPSY: SHX5522

## 2022-04-21 HISTORY — PX: POLYPECTOMY: SHX5525

## 2022-04-21 LAB — GLUCOSE, CAPILLARY: Glucose-Capillary: 215 mg/dL — ABNORMAL HIGH (ref 70–99)

## 2022-04-21 SURGERY — COLONOSCOPY WITH PROPOFOL
Anesthesia: General

## 2022-04-21 MED ORDER — LIDOCAINE HCL (CARDIAC) PF 100 MG/5ML IV SOSY
PREFILLED_SYRINGE | INTRAVENOUS | Status: DC | PRN
  Administered 2022-04-21: 50 mg via INTRAVENOUS

## 2022-04-21 MED ORDER — LACTATED RINGERS IV SOLN
INTRAVENOUS | Status: DC
Start: 2022-04-21 — End: 2022-04-21

## 2022-04-21 MED ORDER — PROPOFOL 500 MG/50ML IV EMUL
INTRAVENOUS | Status: DC | PRN
  Administered 2022-04-21: 150 ug/kg/min via INTRAVENOUS

## 2022-04-21 MED ORDER — LACTATED RINGERS IV SOLN: INTRAVENOUS | Status: DC | PRN

## 2022-04-21 MED ORDER — PROPOFOL 10 MG/ML IV BOLUS
INTRAVENOUS | Status: DC | PRN
  Administered 2022-04-21 (×3): 20 mg via INTRAVENOUS

## 2022-04-21 NOTE — H&P (Signed)
Primary Care Physician:  [name], DO Primary Gastroenterologist:  Dr. Abbey Chatters  Pre-Procedure History & Physical: HPI:  [name] [name] is a 61 y.o. male is here for a colonoscopy to be performed for anemia and chronic diarrhea.   Past Medical History:  Diagnosis Date   Anxiety    Arthritis    Diabetes mellitus without complication ([place])    Diabetes mellitus, type II ([place])    Diverticulosis    pt unaware   ED (erectile dysfunction)    Fatty liver    GERD (gastroesophageal reflux disease)    History of COVID-19 07/2019   History of left inguinal hernia    History of retinal detachment    Right   Hyperlipidemia    Hypertension    Insomnia    Low ferritin level    Numbness and tingling of both feet    Shortness of breath dyspnea    since having COVID 07/2019 with exertions    Past Surgical History:  Procedure Laterality Date   CHOLECYSTECTOMY     COLONOSCOPY N/A 11/30/2014   Procedure: COLONOSCOPY;  Surgeon: Daneil Dolin, MD;  [facility];  Service: Endoscopy;  Laterality: N/A;  7:30 Am   ESOPHAGEAL DILATION     HERNIA REPAIR     1981   RETINAL DETACHMENT SURGERY Right    TOTAL KNEE ARTHROPLASTY Left 12/11/2019   Procedure: TOTAL KNEE ARTHROPLASTY;  Surgeon: Gaynelle Arabian, MD;  Location: [facility];  Service: Orthopedics;  Laterality: Left;  67min   VASCULAR SURGERY      Prior to Admission medications   Medication Sig Start Date End Date Taking? Authorizing Provider  acetaminophen (TYLENOL) 500 MG tablet Take 1,000 mg by mouth every morning.   Yes [provider]  atorvastatin (LIPITOR) 20 MG tablet TAKE 1 TABLET BY MOUTH AT BEDTIME. 04/16/22  Yes Cook, Jayce G, DO  dutasteride (AVODART) 0.5 MG capsule TAKE 1 CAPSULE BY MOUTH ONCE DAILY. 04/16/22  Yes Cook, Jayce G, DO  fluticasone (FLONASE) 50 MCG/ACT nasal spray USE 2 SPRAYS IN EACH NOSTRIL EVERY DAY, AS NEEDED. 10/22/21  Yes Cook, Jayce G, DO  glipiZIDE (GLUCOTROL) 5 MG tablet Take 1 tablet (5 mg  total) by mouth 2 (two) times daily before a meal. 01/19/22  Yes Reardon, Juanetta Beets, NP  Iron, Ferrous Sulfate, 325 (65 Fe) MG TABS Take 325 mg by mouth every other day. 02/25/22  Yes Cook, Jayce G, DO  LORazepam (ATIVAN) 0.5 MG tablet TAKE 1 TABLET BY MOUTH AT BEDTIME. 02/14/22  Yes Cook, Jayce G, DO  losartan (COZAAR) 50 MG tablet TAKE 1 TABLET BY MOUTH ONCE DAILY. 04/16/22  Yes Cook, Jayce G, DO  metFORMIN (GLUCOPHAGE) 500 MG tablet TAKE 2 TABLETS BY MOUTH TWICE DAILY. 02/14/22  Yes Cook, Jayce G, DO  omeprazole (PRILOSEC) 20 MG capsule TAKE 1 CAPSULE 2 TIMES A DAY BEFORE MEALS. 04/16/22  Yes Cook, Jayce G, DO  vitamin B-12 (CYANOCOBALAMIN) 1000 MCG tablet Take 1,000 mcg by mouth daily. Chewable   Yes [provider]  Vitamin D, Ergocalciferol, (DRISDOL) 1.25 MG (50000 UNIT) CAPS capsule Take 1 capsule (50,000 Units total) by mouth every 7 (seven) days. 01/19/22  Yes Brita Romp, NP  blood glucose meter kit and supplies KIT Dispense based on patient and insurance preference. Test sugar up to one time a day 03/20/15   Mikey Kirschner, MD  CONTOUR NEXT TEST test strip USE TO TEST BLOOD SUGAR ONCE DAILY. 01/13/21   Lovena Le,  Malena M, DO  ONETOUCH DELICA LANCETS 10U MISC  03/20/15   [provider]  polyethylene glycol-electrolytes (NULYTELY) 420 g solution As directed 03/18/22   Eloise Harman, DO  tamsulosin (FLOMAX) 0.4 MG CAPS capsule TAKE 2 CAPSULES NIGHTLY. 04/20/22   Cook, Bolton G, DO  traMADol (ULTRAM) 50 MG tablet TAKE 1 TABLET BY MOUTH EVERY 12 HOURS AS NEEDED FOR PAIN. Patient taking differently: Take 50 mg by mouth at bedtime. 10/22/21   [name], DO    Allergies as of 03/18/2022 - Review Complete 03/18/2022  Allergen Reaction Noted   Dilaudid [hydromorphone hcl] Other (See Comments) 10/22/2014    Family History  Problem Relation Age of Onset   Diabetes Mother    Neuropathy Mother     Social History   Socioeconomic History   Marital status: Married     Spouse name: Not on file   Number of children: Not on file   Years of education: Not on file   Highest education level: Not on file  Occupational History   Not on file  Tobacco Use   Smoking status: Never    Passive exposure: Never   Smokeless tobacco: Never  Vaping Use   Vaping Use: Never used  Substance and Sexual Activity   Alcohol use: No   Drug use: No   Sexual activity: Never  Other Topics Concern   Not on file  Social History Narrative   Not on file   Social Determinants of Health   Financial Resource Strain: Not on file  Food Insecurity: Not on file  Transportation Needs: Not on file  Physical Activity: Not on file  Stress: Not on file  Social Connections: Not on file  Intimate Partner Violence: Not on file    Review of Systems: See HPI, otherwise negative ROS  Physical Exam: Vital signs in last 24 hours: Temp:  [98 F (36.7 C)] 98 F (36.7 C) (08/01 0745) Pulse Rate:  [54] 54 (08/01 0745) Resp:  [14] 14 (08/01 0745) BP: (140)/(90) 140/90 (08/01 0745) SpO2:  [100 %] 100 % (08/01 0745) Weight:  [93 kg] 93 kg (08/01 0745)   General:   Alert,  Well-developed, well-nourished, pleasant and cooperative in NAD Head:  Normocephalic and atraumatic. Eyes:  Sclera clear, no icterus.   Conjunctiva pink. Ears:  Normal auditory acuity. Nose:  No deformity, discharge,  or lesions. Mouth:  No deformity or lesions, dentition normal. Neck:  Supple; no masses or thyromegaly. Lungs:  Clear throughout to auscultation.   No wheezes, crackles, or rhonchi. No acute distress. Heart:  Regular rate and rhythm; no murmurs, clicks, rubs,  or gallops. Abdomen:  Soft, nontender and nondistended. No masses, hepatosplenomegaly or hernias noted. Normal bowel sounds, without guarding, and without rebound.   Msk:  Symmetrical without gross deformities. Normal posture. Extremities:  Without clubbing or edema. Neurologic:  Alert and  oriented x4;  grossly normal neurologically. Skin:   Intact without significant lesions or rashes. Cervical Nodes:  No significant cervical adenopathy. Psych:  Alert and cooperative. Normal mood and affect.  Impression/Plan: [name] [name] is here for a colonoscopy to be performed for anemia and chronic diarrhea.   The risks of the procedure including infection, bleed, or perforation as well as benefits, limitations, alternatives and imponderables have been reviewed with the patient. Questions have been answered. All parties agreeable.

## 2022-04-21 NOTE — Discharge Instructions (Addendum)
  Colonoscopy Discharge Instructions  Read the instructions outlined below and refer to this sheet in the next few weeks. These discharge instructions provide you with general information on caring for yourself after you leave the hospital. Your doctor may also give you specific instructions. While your treatment has been planned according to the most current medical practices available, unavoidable complications occasionally occur.   ACTIVITY You may resume your regular activity, but move at a slower pace for the next 24 hours.  Take frequent rest periods for the next 24 hours.  Walking will help get rid of the air and reduce the bloated feeling in your belly (abdomen).  No driving for 24 hours (because of the medicine (anesthesia) used during the test).   Do not sign any important legal documents or operate any machinery for 24 hours (because of the anesthesia used during the test).  NUTRITION Drink plenty of fluids.  You may resume your normal diet as instructed by your doctor.  Begin with a light meal and progress to your normal diet. Heavy or fried foods are harder to digest and may make you feel sick to your stomach (nauseated).  Avoid alcoholic beverages for 24 hours or as instructed.  MEDICATIONS You may resume your normal medications unless your doctor tells you otherwise.  WHAT YOU CAN EXPECT TODAY Some feelings of bloating in the abdomen.  Passage of more gas than usual.  Spotting of blood in your stool or on the toilet paper.  IF YOU HAD POLYPS REMOVED DURING THE COLONOSCOPY: No aspirin products for 7 days or as instructed.  No alcohol for 7 days or as instructed.  Eat a soft diet for the next 24 hours.  FINDING OUT THE RESULTS OF YOUR TEST Not all test results are available during your visit. If your test results are not back during the visit, make an appointment with your caregiver to find out the results. Do not assume everything is normal if you have not heard from your  caregiver or the medical facility. It is important for you to follow up on all of your test results.  SEEK IMMEDIATE MEDICAL ATTENTION IF: You have more than a spotting of blood in your stool.  Your belly is swollen (abdominal distention).  You are nauseated or vomiting.  You have a temperature over 101.  You have abdominal pain or discomfort that is severe or gets worse throughout the day.   Your colonoscopy revealed 1 polyp(s) which I removed successfully. Await pathology results, my office will contact you. I recommend repeating colonoscopy in 5 years for surveillance purposes.   You also have diverticulosis and internal hemorrhoids. I would recommend increasing fiber in your diet or adding OTC Benefiber/Metamucil. Be sure to drink at least 4 to 6 glasses of water daily.   Overall the mucosa of your colon looked healthy.  I did take biopsies to rule out a condition called microscopic colitis which can cause chronically loose stools.  Follow-up with Dr. Marletta Lor in 3 to 4 months.  MESSAGE LEFT AT OFFICE TO CONTACT PATIENT FOR FOLLOW UP APPOINTMENT    I hope you have a great rest of your week!  Hennie Duos. Marletta Lor, D.O. Gastroenterology and Hepatology [facility]

## 2022-04-21 NOTE — Anesthesia Preprocedure Evaluation (Signed)
Anesthesia Evaluation  Patient identified by MRN, date of birth, ID band Patient awake    Reviewed: Allergy & Precautions, H&P , NPO status , Patient's Chart, lab work & pertinent test results, reviewed documented beta blocker date and time   Airway Mallampati: II  TM Distance: >3 FB Neck ROM: full    Dental no notable dental hx.    Pulmonary shortness of breath,    Pulmonary exam normal breath sounds clear to auscultation       Cardiovascular Exercise Tolerance: Good hypertension, negative cardio ROS   Rhythm:regular Rate:Normal     Neuro/Psych PSYCHIATRIC DISORDERS Anxiety negative neurological ROS     GI/Hepatic Neg liver ROS, GERD  Medicated,  Endo/Other  negative endocrine ROSdiabetes, Type 2  Renal/GU negative Renal ROS  negative genitourinary   Musculoskeletal   Abdominal   Peds  Hematology negative hematology ROS (+)   Anesthesia Other Findings   Reproductive/Obstetrics negative OB ROS                             Anesthesia Physical Anesthesia Plan  ASA: 2  Anesthesia Plan: General   Post-op Pain Management:    Induction:   PONV Risk Score and Plan: Propofol infusion  Airway Management Planned:   Additional Equipment:   Intra-op Plan:   Post-operative Plan:   Informed Consent: I have reviewed the patients History and Physical, chart, labs and discussed the procedure including the risks, benefits and alternatives for the proposed anesthesia with the patient or authorized representative who has indicated his/her understanding and acceptance.     Dental Advisory Given  Plan Discussed with: CRNA  Anesthesia Plan Comments:         Anesthesia Quick Evaluation

## 2022-04-21 NOTE — Transfer of Care (Signed)
Immediate Anesthesia Transfer of Care Note  Patient: [name] [name]  Procedure(s) Performed: COLONOSCOPY WITH PROPOFOL BIOPSY POLYPECTOMY  Patient Location: PACU  Anesthesia Type:MAC  Level of Consciousness: awake and patient cooperative  Airway & Oxygen Therapy: Patient Spontanous Breathing  Post-op Assessment: Report given to RN and Post -op Vital signs reviewed and stable  Post vital signs: Reviewed and stable  Last Vitals:  Vitals Value Taken Time  BP 107/70 04/21/22 0934  Temp 36.6 C 04/21/22 0934  Pulse 72 04/21/22 0934  Resp 21 04/21/22 0934  SpO2 97 % 04/21/22 0934    Last Pain:  Vitals:   04/21/22 0934  TempSrc: Oral  PainSc: 0-No pain      Patients Stated Pain Goal: 10 (11/64/35 3912)  Complications: No notable events documented.

## 2022-04-21 NOTE — Op Note (Signed)
[facility] Patient Name: [name] [name] Procedure Date: 04/21/2022 9:00 AM MRN: [id number] Date of Birth: 1960-11-03 Attending MD: Elon Alas. Edgar Frisk CSN: [id number] Age: 61 Admit Type: Outpatient Procedure:                Colonoscopy Indications:              Chronic diarrhea, Iron deficiency Providers:                Elon Alas. Abbey Chatters, DO, Tammy Vaught, RN, Randa Spike, Technician, Ladoris Gene, Technician Referring MD:              Medicines:                See the Anesthesia note for documentation of the                            administered medications Complications:            No immediate complications. Estimated Blood Loss:     Estimated blood loss was minimal. Procedure:                Pre-Anesthesia Assessment:                           - The anesthesia plan was to use monitored                            anesthesia care (MAC).                           After obtaining informed consent, the colonoscope                            was passed under direct vision. Throughout the                            procedure, the patient's blood pressure, pulse, and                            oxygen saturations were monitored continuously. The                            [id number]-HQ190L ([id number]) scope was introduced through                            the anus and advanced to the the cecum, identified                            by appendiceal orifice and ileocecal valve. The                            colonoscopy was performed without difficulty. The                            patient tolerated the procedure  well. The quality                            of the bowel preparation was evaluated using the                            BBPS [facility] : [facility] Bowel Preparation Scale) with scores                            of: Right Colon = 3, Transverse Colon = 3 and Left                            Colon = 3 (entire mucosa seen well with no residual                             staining, small fragments of stool or opaque                            liquid). The total BBPS score equals 9. Scope In: 9:14:21 AM Scope Out: 9:30:20 AM Scope Withdrawal Time: 0 hours 13 minutes 41 seconds  Total Procedure Duration: 0 hours 15 minutes 59 seconds  Findings:      The perianal and digital rectal examinations were normal.      Non-bleeding internal hemorrhoids were found during endoscopy.      Many small and large-mouthed diverticula were found in the sigmoid       colon, descending colon, transverse colon and ascending colon.      A 5 mm polyp was found in the transverse colon. The polyp was sessile.       The polyp was removed with a cold snare. Resection and retrieval were       complete.      Biopsies for histology were taken with a cold forceps from the ascending       colon, transverse colon and descending colon for evaluation of       microscopic colitis.      The exam was otherwise without abnormality. Impression:               - Non-bleeding internal hemorrhoids.                           - Diverticulosis in the sigmoid colon, in the                            descending colon, in the transverse colon and in                            the ascending colon.                           - One 5 mm polyp in the transverse colon, removed                            with a cold snare. Resected and retrieved.                           -  The examination was otherwise normal.                           - Biopsies were taken with a cold forceps from the                            ascending colon, transverse colon and descending                            colon for evaluation of microscopic colitis. Moderate Sedation:      Per Anesthesia Care Recommendation:           - Patient has a contact number available for                            emergencies. The signs and symptoms of potential                            delayed complications were discussed with the                             patient. Return to normal activities tomorrow.                            Written discharge instructions were provided to the                            patient.                           - Resume previous diet.                           - Continue present medications.                           - Await pathology results.                           - Repeat colonoscopy in 5 years for surveillance.                           - Return to GI clinic in 4 months. Procedure Code(s):        --- Professional ---                           [id number], Colonoscopy, flexible; with removal of                            tumor(s), polyp(s), or other lesion(s) by snare                            technique                           61443, 30, Colonoscopy, flexible; with biopsy,  single or multiple Diagnosis Code(s):        --- Professional ---                           K64.8, Other hemorrhoids                           K63.5, Polyp of colon                           K52.9, Noninfective gastroenteritis and colitis,                            unspecified                           K57.30, Diverticulosis of large intestine without                            perforation or abscess without bleeding CPT copyright 2019 American Medical Association. All rights reserved. The codes documented in this report are preliminary and upon coder review may  be revised to meet current compliance requirements. Elon Alas. Abbey Chatters, DO [place] Abbey Chatters, DO 04/21/2022 9:34:34 AM This report has been signed electronically. Number of Addenda: 0

## 2022-04-22 LAB — SURGICAL PATHOLOGY

## 2022-04-22 NOTE — Anesthesia Postprocedure Evaluation (Signed)
Anesthesia Post Note  Patient: [name] [name]  Procedure(s) Performed: COLONOSCOPY WITH PROPOFOL BIOPSY POLYPECTOMY  Patient location during evaluation: Phase II Anesthesia Type: General Level of consciousness: awake Pain management: pain level controlled Vital Signs Assessment: post-procedure vital signs reviewed and stable Respiratory status: spontaneous breathing and respiratory function stable Cardiovascular status: blood pressure returned to baseline and stable Postop Assessment: no headache and no apparent nausea or vomiting Anesthetic complications: no Comments: Late entry   No notable events documented.   Last Vitals:  Vitals:   04/21/22 0745 04/21/22 0934  BP: (!) 140/90 107/70  Pulse: (!) 54 72  Resp: 14 (!) 21  Temp: 36.7 C 36.6 C  SpO2: 100% 97%    Last Pain:  Vitals:   04/21/22 0934  TempSrc: Oral  PainSc: 0-No pain                 Windell Norfolk

## 2022-04-28 ENCOUNTER — Encounter (HOSPITAL_COMMUNITY): Payer: Self-pay | Admitting: Internal Medicine

## 2022-05-13 ENCOUNTER — Other Ambulatory Visit: Payer: Self-pay | Admitting: Family Medicine

## 2022-05-26 ENCOUNTER — Other Ambulatory Visit: Payer: Self-pay

## 2022-05-26 MED ORDER — GLIPIZIDE 5 MG PO TABS: 5.0000 mg | ORAL_TABLET | Freq: Two times a day (BID) | ORAL | 0 refills | Status: DC

## 2022-05-26 MED ORDER — METFORMIN HCL 500 MG PO TABS: 1000.0000 mg | ORAL_TABLET | Freq: Two times a day (BID) | ORAL | 0 refills | Status: DC

## 2022-05-27 ENCOUNTER — Ambulatory Visit: Payer: BC Managed Care – PPO | Admitting: Nurse Practitioner

## 2022-06-08 ENCOUNTER — Other Ambulatory Visit: Payer: Self-pay | Admitting: Family Medicine

## 2022-06-08 DIAGNOSIS — G4700 Insomnia, unspecified: Secondary | ICD-10-CM

## 2022-06-10 ENCOUNTER — Encounter: Payer: Self-pay | Admitting: *Deleted

## 2022-06-18 ENCOUNTER — Encounter: Payer: Self-pay | Admitting: Nurse Practitioner

## 2022-06-18 ENCOUNTER — Ambulatory Visit: Payer: BC Managed Care – PPO | Admitting: Nurse Practitioner

## 2022-06-18 VITALS — BP 113/77 | HR 87 | Ht 73.0 in | Wt 208.0 lb

## 2022-06-18 DIAGNOSIS — E782 Mixed hyperlipidemia: Secondary | ICD-10-CM | POA: Diagnosis not present

## 2022-06-18 DIAGNOSIS — I10 Essential (primary) hypertension: Secondary | ICD-10-CM

## 2022-06-18 DIAGNOSIS — E119 Type 2 diabetes mellitus without complications: Secondary | ICD-10-CM | POA: Diagnosis not present

## 2022-06-18 LAB — POCT GLYCOSYLATED HEMOGLOBIN (HGB A1C): Hemoglobin A1C: 7.9 % — AB (ref 4.0–5.6)

## 2022-06-18 LAB — POCT UA - MICROALBUMIN

## 2022-06-18 MED ORDER — SITAGLIPTIN PHOSPHATE 50 MG PO TABS: 50.0000 mg | ORAL_TABLET | Freq: Every day | ORAL | 1 refills | Status: DC

## 2022-06-18 MED ORDER — METFORMIN HCL 500 MG PO TABS
1000.0000 mg | ORAL_TABLET | Freq: Two times a day (BID) | ORAL | 3 refills | Status: DC
Start: 2022-06-18 — End: 2022-10-19

## 2022-06-18 MED ORDER — GLIPIZIDE 5 MG PO TABS
5.0000 mg | ORAL_TABLET | Freq: Two times a day (BID) | ORAL | 3 refills | Status: DC
Start: 2022-06-18 — End: 2022-08-27

## 2022-06-18 MED ORDER — VITAMIN D (ERGOCALCIFEROL) 1.25 MG (50000 UNIT) PO CAPS
50000.0000 [IU] | ORAL_CAPSULE | ORAL | 0 refills | Status: DC
Start: 2022-06-18 — End: 2022-11-11

## 2022-06-18 NOTE — Progress Notes (Signed)
Endocrinology Follow Up Note       06/18/2022, 11:42 AM   Subjective:    [name], male    DOB: 04-14-1961.  [name] [name] is being seen in follow up after being seen in consultation for management of currently uncontrolled symptomatic diabetes requested by  [name], DO.   Past Medical History:  Diagnosis Date   Anxiety    Arthritis    Diabetes mellitus without complication ([place])    Diabetes mellitus, type II ([place])    Diverticulosis    pt unaware   ED (erectile dysfunction)    Fatty liver    GERD (gastroesophageal reflux disease)    History of COVID-19 07/2019   History of left inguinal hernia    History of retinal detachment    Right   Hyperlipidemia    Hypertension    Insomnia    Low ferritin level    Numbness and tingling of both feet    Shortness of breath dyspnea    since having COVID 07/2019 with exertions    Past Surgical History:  Procedure Laterality Date   BIOPSY  04/21/2022   Procedure: BIOPSY;  Surgeon: Eloise Harman, DO;  [facility]: [facility];  Service: Endoscopy;;   CHOLECYSTECTOMY     COLONOSCOPY N/A 11/30/2014   Procedure: COLONOSCOPY;  Surgeon: Daneil Dolin, MD;  Location: [facility];  Service: Endoscopy;  Laterality: N/A;  7:30 Am   COLONOSCOPY WITH PROPOFOL N/A 04/21/2022   Procedure: COLONOSCOPY WITH PROPOFOL;  Surgeon: Eloise Harman, DO;  Location: [facility];  Service: Endoscopy;  Laterality: N/A;  9:00am   ESOPHAGEAL DILATION     HERNIA REPAIR     1981   POLYPECTOMY  04/21/2022   Procedure: POLYPECTOMY;  Surgeon: Eloise Harman, DO;  Location: [facility];  Service: Endoscopy;;   RETINAL DETACHMENT SURGERY Right    TOTAL KNEE ARTHROPLASTY Left 12/11/2019   Procedure: TOTAL KNEE ARTHROPLASTY;  Surgeon: Gaynelle Arabian, MD;  Location: [facility];  Service: Orthopedics;  Laterality: Left;  67mn   VASCULAR SURGERY      Social History    Socioeconomic History   Marital status: Married    Spouse name: Not on file   Number of children: Not on file   Years of education: Not on file   Highest education level: Not on file  Occupational History   Not on file  Tobacco Use   Smoking status: Never    Passive exposure: Never   Smokeless tobacco: Never  Vaping Use   Vaping Use: Never used  Substance and Sexual Activity   Alcohol use: No   Drug use: No   Sexual activity: Never  Other Topics Concern   Not on file  Social History Narrative   Not on file   Social Determinants of Health   Financial Resource Strain: Not on file  Food Insecurity: Not on file  Transportation Needs: Not on file  Physical Activity: Not on file  Stress: Not on file  Social Connections: Not on file    Family History  Problem Relation Age of Onset   Diabetes Mother    Neuropathy Mother  Outpatient Encounter Medications as of 06/18/2022  Medication Sig   acetaminophen (TYLENOL) 500 MG tablet Take 1,000 mg by mouth every morning.   atorvastatin (LIPITOR) 20 MG tablet TAKE 1 TABLET BY MOUTH AT BEDTIME.   blood glucose meter kit and supplies KIT Dispense based on patient and insurance preference. Test sugar up to one time a day   CONTOUR NEXT TEST test strip USE TO TEST BLOOD SUGAR ONCE DAILY.   dutasteride (AVODART) 0.5 MG capsule TAKE 1 CAPSULE BY MOUTH ONCE DAILY.   fluticasone (FLONASE) 50 MCG/ACT nasal spray USE 2 SPRAYS IN EACH NOSTRIL EVERY DAY, AS NEEDED.   LORazepam (ATIVAN) 0.5 MG tablet TAKE 1 TABLET BY MOUTH AT BEDTIME.   losartan (COZAAR) 50 MG tablet TAKE 1 TABLET BY MOUTH ONCE DAILY.   omeprazole (PRILOSEC) 20 MG capsule TAKE 1 CAPSULE 2 TIMES A DAY BEFORE MEALS.   ONETOUCH DELICA LANCETS 29N MISC    sitaGLIPtin (JANUVIA) 50 MG tablet Take 1 tablet (50 mg total) by mouth daily.   tamsulosin (FLOMAX) 0.4 MG CAPS capsule TAKE 2 CAPSULES NIGHTLY.   traMADol (ULTRAM) 50 MG tablet TAKE 1 TABLET BY MOUTH EVERY 12 HOURS AS  NEEDED FOR PAIN. (Patient taking differently: Take 50 mg by mouth at bedtime.)   vitamin B-12 (CYANOCOBALAMIN) 1000 MCG tablet Take 1,000 mcg by mouth daily. Chewable   [DISCONTINUED] glipiZIDE (GLUCOTROL) 5 MG tablet Take 1 tablet (5 mg total) by mouth 2 (two) times daily before a meal.   [DISCONTINUED] metFORMIN (GLUCOPHAGE) 500 MG tablet Take 2 tablets (1,000 mg total) by mouth 2 (two) times daily.   glipiZIDE (GLUCOTROL) 5 MG tablet Take 1 tablet (5 mg total) by mouth 2 (two) times daily before a meal.   Iron, Ferrous Sulfate, 325 (65 Fe) MG TABS Take 325 mg by mouth every other day. (Patient not taking: Reported on 06/18/2022)   metFORMIN (GLUCOPHAGE) 500 MG tablet Take 2 tablets (1,000 mg total) by mouth 2 (two) times daily.   Vitamin D, Ergocalciferol, (DRISDOL) 1.25 MG (50000 UNIT) CAPS capsule Take 1 capsule (50,000 Units total) by mouth every 7 (seven) days.   [DISCONTINUED] Vitamin D, Ergocalciferol, (DRISDOL) 1.25 MG (50000 UNIT) CAPS capsule Take 1 capsule (50,000 Units total) by mouth every 7 (seven) days. (Patient not taking: Reported on 06/18/2022)   No facility-administered encounter medications on file as of 06/18/2022.    ALLERGIES: Allergies  Allergen Reactions   Dilaudid [Hydromorphone Hcl] Other (See Comments)    PT SAID HE PASSED OUT WHEN HE TOOK IT    VACCINATION STATUS: Immunization History  Administered Date(s) Administered   Influenza, Seasonal, Injecte, Preservative Fre 06/19/2016   Influenza,inj,Quad PF,6+ Mos 06/17/2017, 06/20/2018, 07/12/2019, 07/19/2020, 08/19/2021    Diabetes He presents for his follow-up diabetic visit. He has type 2 diabetes mellitus. Onset time: Diagnosed at approx age of 70. His disease course has been worsening. There are no hypoglycemic associated symptoms. Pertinent negatives for diabetes include no fatigue, no foot paresthesias and no polydipsia. There are no hypoglycemic complications. Symptoms are stable. Diabetic complications  include impotence, nephropathy and peripheral neuropathy. Risk factors for coronary artery disease include diabetes mellitus, dyslipidemia, family history, male sex and hypertension. Current diabetic treatment includes oral agent (dual therapy). He is compliant with treatment most of the time. His weight is fluctuating minimally. He is following a generally healthy diet. When asked about meal planning, he reported none. He has not had a previous visit with a dietitian. He participates in exercise intermittently. His home  blood glucose trend is fluctuating minimally. His breakfast blood glucose range is generally 140-180 mg/dl. (He presents today with his meter and logs showing above target fasting glycemic profile.  His POCT A1c today is 7.9%, increasing from last visit of 7.5%.  He denies any new changes in diet routine or physical exercise other than he is walking a bit more than before.  He eats fairly healthy.  Analysis of his meter shows 14-day average of 172.) An ACE inhibitor/angiotensin II receptor blocker is being taken. He does not see a podiatrist.Eye exam is current.  Hyperlipidemia This is a chronic problem. The current episode started more than 1 year ago. The problem is uncontrolled. Recent lipid tests were reviewed and are variable. Exacerbating diseases include chronic renal disease, diabetes and liver disease. fatty liver. Factors aggravating his hyperlipidemia include fatty foods. Current antihyperlipidemic treatment includes statins and diet change. The current treatment provides mild improvement of lipids. Compliance problems include adherence to diet and adherence to exercise.  Risk factors for coronary artery disease include diabetes mellitus, dyslipidemia, family history, male sex, obesity and hypertension.  Hypertension This is a chronic problem. The current episode started more than 1 year ago. The problem has been resolved since onset. The problem is controlled. There are no associated  agents to hypertension. Risk factors for coronary artery disease include dyslipidemia, diabetes mellitus, family history and male gender. Past treatments include angiotensin blockers. The current treatment provides moderate improvement. There are no compliance problems.  Hypertensive end-organ damage includes kidney disease. Identifiable causes of hypertension include chronic renal disease.    Review of systems  Constitutional: + slowly increasing body weight,  current Body mass index is 27.44 kg/m. , no fatigue, no subjective hyperthermia, no subjective hypothermia Eyes: no blurry vision, no xerophthalmia ENT: no sore throat, no nodules palpated in throat, no dysphagia/odynophagia, no hoarseness Cardiovascular: no chest pain, no shortness of breath, no palpitations, no leg swelling Respiratory: no cough, no shortness of breath Gastrointestinal: no nausea/vomiting/diarrhea Musculoskeletal: no muscle/joint aches Skin: no rashes, no hyperemia Neurological: no tremors, no numbness, no tingling Psychiatric: no depression, no anxiety   Objective:     BP 113/77 (BP Location: Right Arm, Patient Position: Sitting, Cuff Size: Normal)   Pulse 87   Ht _0  (1.854 m)   Wt 208 lb (94.3 kg)   BMI 27.44 kg/m   Wt Readings from Last 3 Encounters:  06/18/22 208 lb (94.3 kg)  04/21/22 205 lb (93 kg)  03/18/22 207 lb 1.6 oz (93.9 kg)     BP Readings from Last 3 Encounters:  06/18/22 113/77  04/21/22 107/70  03/18/22 111/79       Physical Exam- Limited  Constitutional:  Body mass index is 27.44 kg/m. , not in acute distress, normal state of mind Eyes:  EOMI, no exophthalmos Neck: Supple Cardiovascular: RRR, no murmurs, rubs, or gallops, no edema Respiratory: Adequate breathing efforts, no crackles, rales, rhonchi, or wheezing Musculoskeletal: no gross deformities, strength intact in all four extremities, no gross restriction of joint movements Skin:  no rashes, no  hyperemia Neurological: no tremor with outstretched hands   Diabetic Foot Exam - Simple   Simple Foot Form Diabetic Foot exam was performed with the following findings: Yes 06/18/2022 11:25 AM  Visual Inspection No deformities, no ulcerations, no other skin breakdown bilaterally: Yes Sensation Testing Intact to touch and monofilament testing bilaterally: Yes Pulse Check Posterior Tibialis and Dorsalis pulse intact bilaterally: Yes Comments    CMP ( most recent) CMP  Component Value Date/Time   NA 139 01/15/2022 0933   K 4.1 01/15/2022 0933   CL 103 01/15/2022 0933   CO2 22 01/15/2022 0933   GLUCOSE 179 (H) 01/15/2022 0933   GLUCOSE 161 (H) 12/12/2019 0302   BUN 17 01/15/2022 0933   CREATININE 1.10 01/15/2022 0933   CREATININE 0.78 04/18/2014 0719   CALCIUM 9.7 01/15/2022 0933   PROT 6.2 01/15/2022 0933   ALBUMIN 66m/L 06/18/2022 1112   AST 19 01/15/2022 0933   ALT 18 01/15/2022 0933   ALKPHOS 73 01/15/2022 0933   BILITOT 1.2 01/15/2022 0933   GFRNONAA 88 06/11/2020 1204   GFRAA 101 06/11/2020 1204     Diabetic Labs (most recent): Lab Results  Component Value Date   HGBA1C 7.9 (A) 06/18/2022   HGBA1C 7.5 01/19/2022   HGBA1C 7.8 09/17/2021     Lipid Panel ( most recent) Lipid Panel     Component Value Date/Time   CHOL 107 01/15/2022 0933   TRIG 123 01/15/2022 0933   HDL 35 (L) 01/15/2022 0933   CHOLHDL 3.1 01/15/2022 0933   CHOLHDL 5.2 04/18/2014 0719   VLDL 41 (H) 04/18/2014 0719   LDLCALC 50 01/15/2022 0933   LABVLDL 22 01/15/2022 0933      Lab Results  Component Value Date   TSH 2.760 01/15/2022   TSH 3.940 12/23/2020   FREET4 1.20 01/15/2022           Assessment & Plan:   1) Type 2 diabetes mellitus without complication, without long-term current use of insulin ([place]  He presents today with his meter and logs showing above target fasting glycemic profile.  His POCT A1c today is 7.9%, increasing from last visit of 7.5%.  He denies any  new changes in diet routine or physical exercise other than he is walking a bit more than before.  He eats fairly healthy.  Analysis of his meter shows 14-day average of 172.  - [name] RACZKOWSKIhas currently uncontrolled symptomatic type 2 DM since 61years of age.   -Recent labs reviewed.  - I had a long discussion with him about the progressive nature of diabetes and the pathology behind its complications. -his diabetes is complicated by neuropathy and mild CKD and he remains at a high risk for more acute and chronic complications which include CAD, CVA, CKD, retinopathy, and neuropathy. These are all discussed in detail with him.  - Nutritional counseling repeated at each appointment due to patients tendency to fall back in to old habits.  - The patient admits there is a room for improvement in their diet and drink choices. -  Suggestion is made for the patient to avoid simple carbohydrates from their diet including Cakes, Sweet Desserts / Pastries, Ice Cream, Soda (diet and regular), Sweet Tea, Candies, Chips, Cookies, Sweet Pastries, Store Bought Juices, Alcohol in Excess of 1-2 drinks a day, Artificial Sweeteners, Coffee Creamer, and "Sugar-free" Products. This will help patient to have stable blood glucose profile and potentially avoid unintended weight gain.   - I encouraged the patient to switch to unprocessed or minimally processed complex starch and increased protein intake (animal or plant source), fruits, and vegetables.   - Patient is advised to stick to a routine mealtimes to eat 3 meals a day and avoid unnecessary snacks (to snack only to correct hypoglycemia).  - he will be scheduled with PJearld Fenton RDN, CDE for diabetes education.  - I have approached him with the following individualized plan to manage  his diabetes and patient agrees:   -He is advised to continue his Metformin 1000 mg po twice daily with meals and continue his Glipizide to 5 mg po twice daily with meals.   Will trial him on low dose Januvia 50 mg po daily to help with what I expect to be postprandial spikes.   -he is encouraged to continue monitoring blood glucose at least once daily, before breakfast and to call the clinic if he has readings less than 90 or greater than 200 for 3 tests in a row.  - Adjustment parameters are given to him for hypo and hyperglycemia in writing.  - Specific targets for  A1c;  LDL, HDL,  and Triglycerides were discussed with the patient.  2) Blood Pressure /Hypertension:  his blood pressure is controlled to target.   he is advised to continue his current medications including Losartan 50 mg p.o. daily with breakfast.  3) Lipids/Hyperlipidemia:    Review of his recent lipid panel from 01/15/22 showed controlled  LDL at 50.  he  is advised to continue Lipitor 20 mg daily at bedtime.  Side effects and precautions discussed with him.  He is also encouraged to avoid fried foods and butter.   4)  Weight/Diet:  his Body mass index is 27.44 kg/m.  -   he is a candidate for some weight loss. I discussed with him the fact that loss of 5 - 10% of his  current body weight will have the most impact on his diabetes management.  Exercise, and detailed carbohydrates information provided  -  detailed on discharge instructions.  5) Chronic Care/Health Maintenance: -he is on ACEI/ARB and Statin medications and is encouraged to initiate and continue to follow up with Ophthalmology, Dentist, Podiatrist at least yearly or according to recommendations, and advised to stay away from smoking. I have recommended yearly flu vaccine and pneumonia vaccine at least every 5 years; moderate intensity exercise for up to 150 minutes weekly; and sleep for at least 7 hours a day.    - he is advised to maintain close follow up with [name], DO for primary care needs, as well as his other providers for optimal and coordinated care.     I spent 40 minutes in the care of the patient today  including review of labs from [facility], Lipids, Thyroid Function, Hematology (current and previous including abstractions from other facilities); face-to-face time discussing  his blood glucose readings/logs, discussing hypoglycemia and hyperglycemia episodes and symptoms, medications doses, his options of short and long term treatment based on the latest standards of care / guidelines;  discussion about incorporating lifestyle medicine;  and documenting the encounter. Risk reduction counseling performed per USPSTF guidelines to reduce obesity and cardiovascular risk factors.     Please refer to Patient Instructions for Blood Glucose Monitoring and Insulin/Medications Dosing Guide"  in media tab for additional information. Please  also refer to " Patient Self Inventory" in the Media  tab for reviewed elements of pertinent patient history.  [name] [name] participated in the discussions, expressed understanding, and voiced agreement with the above plans.  All questions were answered to his satisfaction. he is encouraged to contact clinic should he have any questions or concerns prior to his return visit.   Follow up plan: - Return in about 4 months (around 10/18/2022) for Diabetes F/U with A1c in office, No previsit labs, Bring meter and logs.  Rayetta Pigg, FNP-BC [facility] [address] [place], [place]  [place] Phone: [phone] Fax: [phone]  06/18/2022, 11:42 AM

## 2022-07-02 ENCOUNTER — Other Ambulatory Visit: Payer: Self-pay | Admitting: Family Medicine

## 2022-07-02 DIAGNOSIS — G8929 Other chronic pain: Secondary | ICD-10-CM

## 2022-07-07 ENCOUNTER — Telehealth: Payer: Self-pay | Admitting: Nurse Practitioner

## 2022-07-07 NOTE — Telephone Encounter (Signed)
Talked with the patient. Recommendation given to him per Whitney Reardon,NP.

## 2022-07-20 ENCOUNTER — Other Ambulatory Visit: Payer: Self-pay | Admitting: Family Medicine

## 2022-07-20 DIAGNOSIS — M545 Low back pain, unspecified: Secondary | ICD-10-CM

## 2022-08-20 ENCOUNTER — Telehealth: Payer: Self-pay | Admitting: Family Medicine

## 2022-08-20 ENCOUNTER — Ambulatory Visit: Payer: BC Managed Care – PPO | Admitting: Family Medicine

## 2022-08-20 DIAGNOSIS — I10 Essential (primary) hypertension: Secondary | ICD-10-CM | POA: Diagnosis not present

## 2022-08-20 DIAGNOSIS — Z23 Encounter for immunization: Secondary | ICD-10-CM | POA: Diagnosis not present

## 2022-08-20 DIAGNOSIS — G4700 Insomnia, unspecified: Secondary | ICD-10-CM | POA: Diagnosis not present

## 2022-08-20 DIAGNOSIS — E119 Type 2 diabetes mellitus without complications: Secondary | ICD-10-CM | POA: Diagnosis not present

## 2022-08-20 DIAGNOSIS — B356 Tinea cruris: Secondary | ICD-10-CM | POA: Insufficient documentation

## 2022-08-20 DIAGNOSIS — E611 Iron deficiency: Secondary | ICD-10-CM | POA: Insufficient documentation

## 2022-08-20 DIAGNOSIS — K219 Gastro-esophageal reflux disease without esophagitis: Secondary | ICD-10-CM | POA: Insufficient documentation

## 2022-08-20 MED ORDER — LORAZEPAM 1 MG PO TABS: 0.5000 mg | ORAL_TABLET | Freq: Every evening | ORAL | 0 refills | Status: DC | PRN

## 2022-08-20 MED ORDER — LINAGLIPTIN 5 MG PO TABS: 5.0000 mg | ORAL_TABLET | Freq: Every day | ORAL | 3 refills | Status: DC

## 2022-08-20 MED ORDER — FLUCONAZOLE 150 MG PO TABS: 150.0000 mg | ORAL_TABLET | ORAL | 0 refills | Status: DC

## 2022-08-20 NOTE — Assessment & Plan Note (Signed)
Treating with Diflucan. 

## 2022-08-20 NOTE — Progress Notes (Signed)
Subjective:  [name], male    DOB: 03-23-1961  Age: 61 y.o. MRN: [id number]  CC: Follow up  HPI:  61 year old male with hypertension, GERD, type 2 diabetes, hyperlipidemia, iron deficiency presents for follow-up.  Most recent hemoglobin A1c 7.9.  He is currently on metformin 1000 mg twice daily, glipizide 5 mg twice daily.  Januvia was added by endocrinology.  Patient states that he cannot afford this medication.  He states that his blood sugars are elevated.  No hypoglycemia.  Blood pressure is well-controlled.  Compliant with losartan.  Patient reports that he has recently developed a rash in the groin and the inner thighs.  He would like me to examine this today.  Additionally, he reports that he would like to increase the dose of his lorazepam.  He states that he was previously on 1 mg.  This has been decreased to 0.5 mg.  He states that he has more difficulty with insomnia on the lower dose.  Patient would like a flu vaccine today.  Patient Active Problem List   Diagnosis Date Noted   GERD (gastroesophageal reflux disease) 08/20/2022   Iron deficiency 08/20/2022   Insomnia 08/20/2022   Tinea cruris 08/20/2022   Nocturia associated with benign prostatic hyperplasia 12/20/2020   Osteoarthritis of left knee 12/11/2019   Hyperlipidemia 06/20/2018   Erectile dysfunction 12/16/2016   Essential hypertension 06/19/2016   Type 2 diabetes mellitus (HCC) 03/20/2015    Social Hx   Social History   Socioeconomic History   Marital status: Married    Spouse name: Not on file   Number of children: Not on file   Years of education: Not on file   Highest education level: Not on file  Occupational History   Not on file  Tobacco Use   Smoking status: Never    Passive exposure: Never   Smokeless tobacco: Never  Vaping Use   Vaping Use: Never used  Substance and Sexual Activity   Alcohol use: No   Drug use: No   Sexual activity: Never  Other Topics Concern    Not on file  Social History Narrative   Not on file   Social Determinants of Health   Financial Resource Strain: Not on file  Food Insecurity: Not on file  Transportation Needs: Not on file  Physical Activity: Not on file  Stress: Not on file  Social Connections: Not on file    Review of Systems Per HPI  Objective:  BP 116/80   Pulse 78   Temp 97.9 F (36.6 C) (Oral)   Wt 209 lb (94.8 kg)   SpO2 98%   BMI 27.57 kg/m      08/20/2022    8:19 AM 06/18/2022   10:55 AM 04/21/2022    9:34 AM  BP/Weight  Systolic BP 116 113 107  Diastolic BP 80 77 70  Wt. (Lbs) 209 208   BMI 27.57 kg/m2 27.44 kg/m2     Physical Exam Vitals and nursing note reviewed.  Constitutional:      General: He is not in acute distress.    Appearance: Normal appearance.  HENT:     Head: Normocephalic and atraumatic.  Cardiovascular:     Rate and Rhythm: Normal rate and regular rhythm.  Pulmonary:     Effort: Pulmonary effort is normal.     Breath sounds: Normal breath sounds. No wheezing, rhonchi or rales.  Skin:    Comments: Bilateral inner thighs with erythematous rash.  Appears to  be fungal in nature.  Neurological:     Mental Status: He is alert.  Psychiatric:        Mood and Affect: Mood normal.        Behavior: Behavior normal.     Lab Results  Component Value Date   WBC 8.8 02/17/2022   HGB 13.1 02/17/2022   HCT 40.4 02/17/2022   PLT 188 02/17/2022   GLUCOSE 179 (H) 01/15/2022   CHOL 107 01/15/2022   TRIG 123 01/15/2022   HDL 35 (L) 01/15/2022   LDLCALC 50 01/15/2022   ALT 18 01/15/2022   AST 19 01/15/2022   NA 139 01/15/2022   K 4.1 01/15/2022   CL 103 01/15/2022   CREATININE 1.10 01/15/2022   BUN 17 01/15/2022   CO2 22 01/15/2022   TSH 2.760 01/15/2022   PSA 0.79 04/18/2014   INR 1.2 12/04/2019   HGBA1C 7.9 (A) 06/18/2022     Assessment & Plan:   Problem List Items Addressed This Visit       Cardiovascular and Mediastinum   Essential hypertension     BP well-controlled.  Continue losartan.        Endocrine   Type 2 diabetes mellitus [facility])    Discussed with endocrinology.  Continue metformin and glipizide.  Will send in Tradjenta and see if this is more affordable.      Relevant Medications   linagliptin (TRADJENTA) 5 MG TABS tablet     Musculoskeletal and Integument   Tinea cruris    Treating with Diflucan.      Relevant Medications   fluconazole (DIFLUCAN) 150 MG tablet     Other   Insomnia    Having difficulty currently.  New Rx for lorazepam sent for 0.5 to 1 mg nightly as needed.      Relevant Medications   LORazepam (ATIVAN) 1 MG tablet   Other Visit Diagnoses     Needs flu shot       Relevant Orders   Flu Vaccine QUAD 61mo+IM (Fluarix, Fluzone & Alfiuria Quad PF) (Completed)       Meds ordered this encounter  Medications   fluconazole (DIFLUCAN) 150 MG tablet    Sig: Take 1 tablet (150 mg total) by mouth once a week.    Dispense:  4 tablet    Refill:  0   LORazepam (ATIVAN) 1 MG tablet    Sig: Take 0.5-1 tablets (0.5-1 mg total) by mouth at bedtime as needed for anxiety or sleep.    Dispense:  90 tablet    Refill:  0   linagliptin (TRADJENTA) 5 MG TABS tablet    Sig: Take 1 tablet (5 mg total) by mouth daily.    Dispense:  30 tablet    Refill:  3    Follow-up:  Return in about 6 months (around 02/18/2023).  Everlene Other DO [facility]

## 2022-08-20 NOTE — Assessment & Plan Note (Signed)
Having difficulty currently.  New Rx for lorazepam sent for 0.5 to 1 mg nightly as needed.

## 2022-08-20 NOTE — Patient Instructions (Addendum)
Medication as prescribed for rash. I will message Endo about the Januvia.  Continue your medications.  Follow up in 6 months or sooner if needed.  Take care  Dr. Adriana Simas

## 2022-08-20 NOTE — Assessment & Plan Note (Signed)
BP well controlled. Continue losartan  ?

## 2022-08-20 NOTE — Assessment & Plan Note (Signed)
Discussed with endocrinology.  Continue metformin and glipizide.  Will send in Tradjenta and see if this is more affordable.

## 2022-08-20 NOTE — Telephone Encounter (Signed)
Tommie Sams, DO  [name], LPN Will you inform the patient that I spoke with endocrinology and I sent a new medication in for his diabetes to see if it is more affordable.   Pt contacted and verbalized understanding.

## 2022-08-27 ENCOUNTER — Other Ambulatory Visit: Payer: Self-pay

## 2022-08-27 MED ORDER — GLIPIZIDE 5 MG PO TABS: ORAL_TABLET | ORAL | 0 refills | Status: DC

## 2022-08-31 ENCOUNTER — Other Ambulatory Visit: Payer: Self-pay | Admitting: Family Medicine

## 2022-09-18 ENCOUNTER — Other Ambulatory Visit: Payer: Self-pay | Admitting: Family Medicine

## 2022-10-04 ENCOUNTER — Other Ambulatory Visit: Payer: Self-pay | Admitting: Family Medicine

## 2022-10-05 ENCOUNTER — Other Ambulatory Visit: Payer: Self-pay | Admitting: Family Medicine

## 2022-10-15 ENCOUNTER — Other Ambulatory Visit: Payer: Self-pay | Admitting: Family Medicine

## 2022-10-19 ENCOUNTER — Encounter: Payer: Self-pay | Admitting: Nurse Practitioner

## 2022-10-19 ENCOUNTER — Ambulatory Visit: Payer: BC Managed Care – PPO | Admitting: Nurse Practitioner

## 2022-10-19 VITALS — BP 110/80 | HR 84 | Ht 73.0 in | Wt 209.6 lb

## 2022-10-19 DIAGNOSIS — E782 Mixed hyperlipidemia: Secondary | ICD-10-CM

## 2022-10-19 DIAGNOSIS — I10 Essential (primary) hypertension: Secondary | ICD-10-CM

## 2022-10-19 DIAGNOSIS — E119 Type 2 diabetes mellitus without complications: Secondary | ICD-10-CM | POA: Diagnosis not present

## 2022-10-19 LAB — POCT GLYCOSYLATED HEMOGLOBIN (HGB A1C): Hemoglobin A1C: 9.3 % — AB (ref 4.0–5.6)

## 2022-10-19 MED ORDER — GLIPIZIDE 5 MG PO TABS: 5.0000 mg | ORAL_TABLET | Freq: Two times a day (BID) | ORAL | 3 refills | Status: DC

## 2022-10-19 MED ORDER — RYBELSUS 3 MG PO TABS: 3.0000 mg | ORAL_TABLET | Freq: Every day | ORAL | 0 refills | Status: DC

## 2022-10-19 MED ORDER — METFORMIN HCL 500 MG PO TABS: 1000.0000 mg | ORAL_TABLET | Freq: Two times a day (BID) | ORAL | 3 refills | Status: DC

## 2022-10-19 NOTE — Progress Notes (Signed)
Endocrinology Follow Up Note       10/19/2022, 11:59 AM   Subjective:    [name], male    DOB: 02/14/1961.  [name] [name] is being seen in follow up after being seen in consultation for management of currently uncontrolled symptomatic diabetes requested by  [name], DO.   Past Medical History:  Diagnosis Date   Anxiety    Arthritis    Diabetes mellitus without complication ([place])    Diabetes mellitus, type II ([place])    Diverticulosis    pt unaware   ED (erectile dysfunction)    Fatty liver    GERD (gastroesophageal reflux disease)    History of COVID-19 07/2019   History of left inguinal hernia    History of retinal detachment    Right   Hyperlipidemia    Hypertension    Insomnia    Low ferritin level    Numbness and tingling of both feet    Shortness of breath dyspnea    since having COVID 07/2019 with exertions    Past Surgical History:  Procedure Laterality Date   BIOPSY  04/21/2022   Procedure: BIOPSY;  Surgeon: Eloise Harman, DO;  Location: [facility];  Service: Endoscopy;;   CHOLECYSTECTOMY     COLONOSCOPY N/A 11/30/2014   Procedure: COLONOSCOPY;  Surgeon: Daneil Dolin, MD;  Location: [facility];  Service: Endoscopy;  Laterality: N/A;  7:30 Am   COLONOSCOPY WITH PROPOFOL N/A 04/21/2022   Procedure: COLONOSCOPY WITH PROPOFOL;  Surgeon: Eloise Harman, DO;  Location: [facility];  Service: Endoscopy;  Laterality: N/A;  9:00am   ESOPHAGEAL DILATION     HERNIA REPAIR     1981   POLYPECTOMY  04/21/2022   Procedure: POLYPECTOMY;  Surgeon: Eloise Harman, DO;  Location: [facility];  Service: Endoscopy;;   RETINAL DETACHMENT SURGERY Right    TOTAL KNEE ARTHROPLASTY Left 12/11/2019   Procedure: TOTAL KNEE ARTHROPLASTY;  Surgeon: Gaynelle Arabian, MD;  Location: [facility];  Service: Orthopedics;  Laterality: Left;  78min   VASCULAR SURGERY      Social History    Socioeconomic History   Marital status: Married    Spouse name: Not on file   Number of children: Not on file   Years of education: Not on file   Highest education level: Not on file  Occupational History   Not on file  Tobacco Use   Smoking status: Never    Passive exposure: Never   Smokeless tobacco: Never  Vaping Use   Vaping Use: Never used  Substance and Sexual Activity   Alcohol use: No   Drug use: No   Sexual activity: Never  Other Topics Concern   Not on file  Social History Narrative   Not on file   Social Determinants of Health   Financial Resource Strain: Not on file  Food Insecurity: Not on file  Transportation Needs: Not on file  Physical Activity: Not on file  Stress: Not on file  Social Connections: Not on file    Family History  Problem Relation Age of Onset   Diabetes Mother    Neuropathy Mother  Outpatient Encounter Medications as of 10/19/2022  Medication Sig   acetaminophen (TYLENOL) 500 MG tablet Take 1,000 mg by mouth every morning.   atorvastatin (LIPITOR) 20 MG tablet TAKE 1 TABLET BY MOUTH AT BEDTIME.   blood glucose meter kit and supplies KIT Dispense based on patient and insurance preference. Test sugar up to one time a day   CONTOUR NEXT TEST test strip USE TO TEST BLOOD SUGAR ONCE DAILY.   dutasteride (AVODART) 0.5 MG capsule TAKE 1 CAPSULE BY MOUTH ONCE DAILY.   fluconazole (DIFLUCAN) 150 MG tablet Take 1 tablet (150 mg total) by mouth once a week.   fluticasone (FLONASE) 50 MCG/ACT nasal spray USE 2 SPRAYS IN EACH NOSTRIL EVERY DAY, AS NEEDED.   LORazepam (ATIVAN) 1 MG tablet Take 0.5-1 tablets (0.5-1 mg total) by mouth at bedtime as needed for anxiety or sleep.   losartan (COZAAR) 50 MG tablet TAKE 1 TABLET BY MOUTH ONCE DAILY.   omeprazole (PRILOSEC) 20 MG capsule TAKE 1 CAPSULE 2 TIMES A DAY BEFORE MEALS.   ONETOUCH DELICA LANCETS 33G MISC    Semaglutide (RYBELSUS) 3 MG TABS Take 1 tablet (3 mg total) by mouth daily.    tamsulosin (FLOMAX) 0.4 MG CAPS capsule TAKE 2 CAPSULES NIGHTLY.   vitamin B-12 (CYANOCOBALAMIN) 1000 MCG tablet Take 1,000 mcg by mouth daily. Chewable   Vitamin D, Ergocalciferol, (DRISDOL) 1.25 MG (50000 UNIT) CAPS capsule Take 1 capsule (50,000 Units total) by mouth every 7 (seven) days.   [DISCONTINUED] glipiZIDE (GLUCOTROL) 5 MG tablet 10mg  qam and 5mg  at night.   [DISCONTINUED] metFORMIN (GLUCOPHAGE) 500 MG tablet Take 2 tablets (1,000 mg total) by mouth 2 (two) times daily.   glipiZIDE (GLUCOTROL) 5 MG tablet Take 1 tablet (5 mg total) by mouth 2 (two) times daily before a meal. 10mg  qam and 5mg  at night.   metFORMIN (GLUCOPHAGE) 500 MG tablet Take 2 tablets (1,000 mg total) by mouth 2 (two) times daily.   [DISCONTINUED] linagliptin (TRADJENTA) 5 MG TABS tablet Take 1 tablet (5 mg total) by mouth daily.   No facility-administered encounter medications on file as of 10/19/2022.    ALLERGIES: Allergies  Allergen Reactions   Dilaudid [Hydromorphone Hcl] Other (See Comments)    PT SAID HE PASSED OUT WHEN HE TOOK IT    VACCINATION STATUS: Immunization History  Administered Date(s) Administered   Influenza, Seasonal, Injecte, Preservative Fre 06/19/2016   Influenza,inj,Quad PF,6+ Mos 06/17/2017, 06/20/2018, 07/12/2019, 07/19/2020, 08/19/2021, 08/20/2022    Diabetes He presents for his follow-up diabetic visit. He has type 2 diabetes mellitus. Onset time: Diagnosed at approx age of 35. His disease course has been worsening. There are no hypoglycemic associated symptoms. Pertinent negatives for diabetes include no fatigue, no foot paresthesias and no polydipsia. There are no hypoglycemic complications. Symptoms are stable. Diabetic complications include impotence, nephropathy and peripheral neuropathy. Risk factors for coronary artery disease include diabetes mellitus, dyslipidemia, family history, male sex and hypertension. Current diabetic treatment includes oral agent (dual therapy). He  is compliant with treatment most of the time. His weight is fluctuating minimally. He is following a generally healthy diet. When asked about meal planning, he reported none. He has not had a previous visit with a dietitian. He participates in exercise intermittently. His home blood glucose trend is increasing steadily. His breakfast blood glucose range is generally >200 mg/dl. (He presents today with his meter and logs showing fasting hyperglycemia.  His POCT A1c today is 9.3%, increasing from last visit of 7.9%.  He  was unable to afford the copay for Tradjenta.  He denies any hypoglycemia.  His 14-day average is 202 on his glucose meter.  He does still drink some soda, mainly with lunch and with supper but in small quantities.) An ACE inhibitor/angiotensin II receptor blocker is being taken. He does not see a podiatrist.Eye exam is current.  Hyperlipidemia This is a chronic problem. The current episode started more than 1 year ago. The problem is uncontrolled. Recent lipid tests were reviewed and are variable. Exacerbating diseases include chronic renal disease, diabetes and liver disease. fatty liver. Factors aggravating his hyperlipidemia include fatty foods. Current antihyperlipidemic treatment includes statins and diet change. The current treatment provides mild improvement of lipids. Compliance problems include adherence to diet and adherence to exercise.  Risk factors for coronary artery disease include diabetes mellitus, dyslipidemia, family history, male sex, obesity and hypertension.  Hypertension This is a chronic problem. The current episode started more than 1 year ago. The problem has been resolved since onset. The problem is controlled. There are no associated agents to hypertension. Risk factors for coronary artery disease include dyslipidemia, diabetes mellitus, family history and male gender. Past treatments include angiotensin blockers. The current treatment provides moderate improvement.  There are no compliance problems.  Hypertensive end-organ damage includes kidney disease. Identifiable causes of hypertension include chronic renal disease.    Review of systems  Constitutional: + Minimally fluctuating body weight,  current Body mass index is 27.65 kg/m. , no fatigue, no subjective hyperthermia, no subjective hypothermia Eyes: no blurry vision, no xerophthalmia ENT: no sore throat, no nodules palpated in throat, no dysphagia/odynophagia, no hoarseness Cardiovascular: no chest pain, no shortness of breath, no palpitations, no leg swelling Respiratory: no cough, no shortness of breath Gastrointestinal: no nausea/vomiting/diarrhea Musculoskeletal: no muscle/joint aches Skin: no rashes, no hyperemia Neurological: no tremors, no numbness, no tingling, no dizziness Psychiatric: no depression, no anxiety   Objective:     BP 110/80 (BP Location: Left Arm, Patient Position: Sitting, Cuff Size: Normal)   Pulse 84   Ht 6\' 1"  (1.854 m)   Wt 209 lb 9.6 oz (95.1 kg)   BMI 27.65 kg/m   Wt Readings from Last 3 Encounters:  10/19/22 209 lb 9.6 oz (95.1 kg)  08/20/22 209 lb (94.8 kg)  06/18/22 208 lb (94.3 kg)     BP Readings from Last 3 Encounters:  10/19/22 110/80  08/20/22 116/80  06/18/22 113/77      Physical Exam- Limited  Constitutional:  Body mass index is 27.65 kg/m. , not in acute distress, normal state of mind Eyes:  EOMI, no exophthalmos Musculoskeletal: no gross deformities, strength intact in all four extremities, no gross restriction of joint movements Skin:  no rashes, no hyperemia Neurological: no tremor with outstretched hands   Diabetic Foot Exam - Simple   No data filed    CMP ( most recent) CMP     Component Value Date/Time   NA 139 01/15/2022 0933   K 4.1 01/15/2022 0933   CL 103 01/15/2022 0933   CO2 22 01/15/2022 0933   GLUCOSE 179 (H) 01/15/2022 0933   GLUCOSE 161 (H) 12/12/2019 0302   BUN 17 01/15/2022 0933   CREATININE 1.10  01/15/2022 0933   CREATININE 0.78 04/18/2014 0719   CALCIUM 9.7 01/15/2022 0933   PROT 6.2 01/15/2022 0933   ALBUMIN 30mg /L 06/18/2022 1112   AST 19 01/15/2022 0933   ALT 18 01/15/2022 0933   ALKPHOS 73 01/15/2022 0933   BILITOT 1.2 01/15/2022  [place] 06/11/2020 1204   GFRAA 101 06/11/2020 1204     Diabetic Labs (most recent): Lab Results  Component Value Date   HGBA1C 9.3 (A) 10/19/2022   HGBA1C 7.9 (A) 06/18/2022   HGBA1C 7.5 01/19/2022     Lipid Panel ( most recent) Lipid Panel     Component Value Date/Time   CHOL 107 01/15/2022 0933   TRIG 123 01/15/2022 0933   HDL 35 (L) 01/15/2022 0933   CHOLHDL 3.1 01/15/2022 0933   CHOLHDL 5.2 04/18/2014 0719   VLDL 41 (H) 04/18/2014 0719   LDLCALC 50 01/15/2022 0933   LABVLDL 22 01/15/2022 0933      Lab Results  Component Value Date   TSH 2.760 01/15/2022   TSH 3.940 12/23/2020   FREET4 1.20 01/15/2022           Assessment & Plan:   1) Type 2 diabetes mellitus without complication, without long-term current use of insulin ([place])  He presents today with his meter and logs showing fasting hyperglycemia.  His POCT A1c today is 9.3%, increasing from last visit of 7.9%.  He was unable to afford the copay for Tradjenta.  He denies any hypoglycemia.  His 14-day average is 202 on his glucose meter.  He does still drink some soda, mainly with lunch and with supper but in small quantities.  - TREVIOUS RAMPEY has currently uncontrolled symptomatic type 2 DM since 62 years of age.   -Recent labs reviewed.  - I had a long discussion with him about the progressive nature of diabetes and the pathology behind its complications. -his diabetes is complicated by neuropathy and mild CKD and he remains at a high risk for more acute and chronic complications which include CAD, CVA, CKD, retinopathy, and neuropathy. These are all discussed in detail with him.  - Nutritional counseling repeated at each appointment due to patients  tendency to fall back in to old habits.  - The patient admits there is a room for improvement in their diet and drink choices. -  Suggestion is made for the patient to avoid simple carbohydrates from their diet including Cakes, Sweet Desserts / Pastries, Ice Cream, Soda (diet and regular), Sweet Tea, Candies, Chips, Cookies, Sweet Pastries, Store Bought Juices, Alcohol in Excess of 1-2 drinks a day, Artificial Sweeteners, Coffee Creamer, and "Sugar-free" Products. This will help patient to have stable blood glucose profile and potentially avoid unintended weight gain.   - I encouraged the patient to switch to unprocessed or minimally processed complex starch and increased protein intake (animal or plant source), fruits, and vegetables.   - Patient is advised to stick to a routine mealtimes to eat 3 meals a day and avoid unnecessary snacks (to snack only to correct hypoglycemia).  - he will be scheduled with Jearld Fenton, RDN, CDE for diabetes education.  - I have approached him with the following individualized plan to manage  his diabetes and patient agrees:   -He is advised to continue his Metformin 1000 mg po twice daily with meals.  Will trial him on Rybelsus 3 mg po daily for 1 month, if tolerated well, we will increase to 7 mg po daily thereafter.  Once he starts the Rybelsus, he will lower his Glipizide to 5 mg po twice daily (he can continue 10 mg with breakfast and 5 mg with supper for now).  We also discussed potentially adding basal insulin on at next visit if we cannot regain control on oral antidiabetic  medications.  -he is encouraged to continue monitoring blood glucose at least once daily, before breakfast and to call the clinic if he has readings less than 90 or greater than 200 for 3 tests in a row.  - Adjustment parameters are given to him for hypo and hyperglycemia in writing.  - Specific targets for  A1c;  LDL, HDL,  and Triglycerides were discussed with the patient.  2)  Blood Pressure /Hypertension:  his blood pressure is controlled to target.   he is advised to continue his current medications including Losartan 50 mg p.o. daily with breakfast.  3) Lipids/Hyperlipidemia:    Review of his recent lipid panel from 01/15/22 showed controlled  LDL at 50.  he  is advised to continue Lipitor 20 mg daily at bedtime.  Side effects and precautions discussed with him.  He is also encouraged to avoid fried foods and butter.  Will recheck lipid panel prior to next visit.  4)  Weight/Diet:  his Body mass index is 27.65 kg/m.  -   he is a candidate for some weight loss. I discussed with him the fact that loss of 5 - 10% of his  current body weight will have the most impact on his diabetes management.  Exercise, and detailed carbohydrates information provided  -  detailed on discharge instructions.  5) Chronic Care/Health Maintenance: -he is on ACEI/ARB and Statin medications and is encouraged to initiate and continue to follow up with Ophthalmology, Dentist, Podiatrist at least yearly or according to recommendations, and advised to stay away from smoking. I have recommended yearly flu vaccine and pneumonia vaccine at least every 5 years; moderate intensity exercise for up to 150 minutes weekly; and sleep for at least 7 hours a day.    - he is advised to maintain close follow up with Tommie Sams, DO for primary care needs, as well as his other providers for optimal and coordinated care.     I spent 48 minutes in the care of the patient today including review of labs from CMP, Lipids, Thyroid Function, Hematology (current and previous including abstractions from other facilities); face-to-face time discussing  his blood glucose readings/logs, discussing hypoglycemia and hyperglycemia episodes and symptoms, medications doses, his options of short and long term treatment based on the latest standards of care / guidelines;  discussion about incorporating lifestyle medicine;  and  documenting the encounter. Risk reduction counseling performed per USPSTF guidelines to reduce obesity and cardiovascular risk factors.     Please refer to Patient Instructions for Blood Glucose Monitoring and Insulin/Medications Dosing Guide"  in media tab for additional information. Please  also refer to " Patient Self Inventory" in the Media  tab for reviewed elements of pertinent patient history.  Rozanna Box participated in the discussions, expressed understanding, and voiced agreement with the above plans.  All questions were answered to his satisfaction. he is encouraged to contact clinic should he have any questions or concerns prior to his return visit.   Follow up plan: - Return in about 3 months (around 01/18/2023) for Diabetes F/U with A1c in office.  Ronny Bacon, [facility] [address] [place], [place] [address] Phone: [phone] Fax: [phone]  10/19/2022, 11:59 AM

## 2022-10-31 ENCOUNTER — Other Ambulatory Visit: Payer: Self-pay | Admitting: Family Medicine

## 2022-11-11 ENCOUNTER — Other Ambulatory Visit: Payer: Self-pay | Admitting: Nurse Practitioner

## 2022-11-11 NOTE — Telephone Encounter (Signed)
Wanted to confirm to reduce his glipizide to 53m bid.

## 2022-11-11 NOTE — Telephone Encounter (Signed)
Yes, that's right

## 2022-11-19 ENCOUNTER — Telehealth: Payer: Self-pay | Admitting: *Deleted

## 2022-11-19 NOTE — Telephone Encounter (Signed)
Patient left a voicemail stating that he was waiting to hear from M[place] about the medication she was going to get for him. He states that it has been 2-3 weeks. Below is what I saw in the patient's last office note in January.   He is advised to continue his Metformin 1000 mg po twice daily with meals.  Will trial him on Rybelsus 3 mg po daily for 1 month, if tolerated well, we will increase to 7 mg po daily thereafter.  Once he starts the Rybelsus, he will lower his Glipizide to 5 mg po twice daily (he can continue 10 mg with breakfast and 5 mg with supper for now).   We also discussed potentially adding basal insulin on at next visit if we cannot regain control on oral antidiabetic medications.   -he is encouraged to continue monitoring blood glucose at least once daily, before breakfast and to call the clinic if he has readings less than 90 or greater than 200 for 3 tests in a row.   - Adjustment parameters are given to him for hypo and hyperglycemia in writing.  Called patient back and he states that he has not gotten anything.  Talked with Ludwig Clarks the pharmacist at [facility]. He states that the prescription for the Rybelsus and the Metformin were not picked up , so they were put back out on the shelf. He also states that on 11/11/22 they received prescriptions for Vitamin D and Glipizide. Pharmacist was advised to wait to feel , until I had talked with Whitney.

## 2022-11-19 NOTE — Telephone Encounter (Signed)
I have called and left  a detailed message for Armond Hang  pharmacist/owner about this patients prescriptions , I have ask that he fill them. I also called the patient's cell phone and left him a detailed message about all the prescriptions and how he was to take them. Both were told if they had questions to please call our office back.

## 2022-11-19 NOTE — Telephone Encounter (Signed)
Goodness, yes he is to start the Rybelsus and follow the set plan as above.  He is to lower his Glipizide to 5 mg po twice daily after starting the Rybelsus.

## 2022-11-24 ENCOUNTER — Telehealth: Payer: Self-pay | Admitting: *Deleted

## 2022-11-24 ENCOUNTER — Other Ambulatory Visit: Payer: Self-pay | Admitting: Family Medicine

## 2022-11-24 MED ORDER — SEMAGLUTIDE 3 MG PO TABS: 3.0000 mg | ORAL_TABLET | Freq: Every day | ORAL | 3 refills | Status: DC

## 2022-11-24 NOTE — Telephone Encounter (Signed)
Patient left a message that [facility] had called him and told him medication ready, and the cost was over $950. He would like something cheaper called into [facility]. Refer to note taken 11/19/2022.

## 2022-11-24 NOTE — Telephone Encounter (Signed)
How strange!  I see he has [employer] as his primary insurance, not Medicare or Medicaid, so the copay cards usually work.  I sent in for Semaglutide 3 mg tablets which should be the generic form of Rybelsus.  Not sure if that will work or not (as I wasn't aware there was a generic for that yet).

## 2022-11-24 NOTE — Telephone Encounter (Signed)
Called the patient and gave this information to both he and his wife. His wife states that she had tried this before but because patient gets help from government, which she said he did , [employer] Care, he is not eligible for this Patient states that Armond Hang - told him that there were other generics that he may try and he mentioned that he gave him one , patient does not know what it was.

## 2022-11-24 NOTE — Telephone Encounter (Signed)
There is a copay card he can download for the Rybelsus which should help get the cost down to as little as $10.  If he does not have internet capabilities, he can text READY to [phone]

## 2022-11-24 NOTE — Telephone Encounter (Signed)
Patient was called and made aware. 

## 2022-11-25 ENCOUNTER — Other Ambulatory Visit: Payer: Self-pay | Admitting: Family Medicine

## 2022-11-27 ENCOUNTER — Other Ambulatory Visit: Payer: Self-pay | Admitting: Family Medicine

## 2022-11-27 DIAGNOSIS — G4700 Insomnia, unspecified: Secondary | ICD-10-CM

## 2022-11-30 ENCOUNTER — Other Ambulatory Visit: Payer: Self-pay | Admitting: *Deleted

## 2022-11-30 ENCOUNTER — Telehealth: Payer: Self-pay | Admitting: *Deleted

## 2022-11-30 MED ORDER — DAPAGLIFLOZIN PROPANEDIOL 5 MG PO TABS: 5.0000 mg | ORAL_TABLET | Freq: Every day | ORAL | 1 refills | Status: DC

## 2022-11-30 NOTE — Telephone Encounter (Signed)
This prescriptioh has been called sent in to the [facility], the patient has been made aware.

## 2022-11-30 NOTE — Telephone Encounter (Signed)
5 mg to start

## 2022-11-30 NOTE — Telephone Encounter (Signed)
Patient was called and made aware that this prescription was sent in.

## 2022-11-30 NOTE — Telephone Encounter (Signed)
Patient called and states that the last medication that we called in, the pharmacy is having trouble getting it to run through with the copay card. He was told that it was a Emergency planning/management officer. The pharmacist has ask to to check with you and see if he could take the generic to the [place]. They have that in the 5 mg and the 10 mg. If he can he would like for this to be called in and then we will let him know.

## 2022-11-30 NOTE — Telephone Encounter (Signed)
Which mg?

## 2022-11-30 NOTE — Telephone Encounter (Signed)
Yes that would be just fine

## 2022-12-02 ENCOUNTER — Other Ambulatory Visit: Payer: Self-pay | Admitting: Nurse Practitioner

## 2022-12-02 ENCOUNTER — Telehealth: Payer: Self-pay | Admitting: Nurse Practitioner

## 2022-12-02 MED ORDER — DAPAGLIFLOZIN PROPANEDIOL 5 MG PO TABS: 5.0000 mg | ORAL_TABLET | Freq: Every day | ORAL | 1 refills | Status: DC

## 2022-12-02 NOTE — Telephone Encounter (Signed)
Pt aware prescription was resent

## 2022-12-02 NOTE — Telephone Encounter (Signed)
Pt stated [facility] does not show a prescription for Wilder Glade and needs it resent in.

## 2022-12-02 NOTE — Telephone Encounter (Signed)
I just re-sent it.

## 2022-12-18 ENCOUNTER — Other Ambulatory Visit (HOSPITAL_COMMUNITY): Payer: Self-pay

## 2022-12-21 ENCOUNTER — Other Ambulatory Visit: Payer: Self-pay | Admitting: Family Medicine

## 2022-12-21 ENCOUNTER — Other Ambulatory Visit: Payer: Self-pay | Admitting: Nurse Practitioner

## 2022-12-24 ENCOUNTER — Other Ambulatory Visit: Payer: Self-pay

## 2022-12-24 MED ORDER — DAPAGLIFLOZIN PROPANEDIOL 5 MG PO TABS: 5.0000 mg | ORAL_TABLET | Freq: Every day | ORAL | 1 refills | Status: DC

## 2023-01-01 ENCOUNTER — Other Ambulatory Visit: Payer: Self-pay | Admitting: Family Medicine

## 2023-01-19 ENCOUNTER — Other Ambulatory Visit: Payer: Self-pay | Admitting: Family Medicine

## 2023-01-19 ENCOUNTER — Ambulatory Visit: Payer: BC Managed Care – PPO | Admitting: Nurse Practitioner

## 2023-01-19 DIAGNOSIS — E119 Type 2 diabetes mellitus without complications: Secondary | ICD-10-CM | POA: Diagnosis not present

## 2023-01-20 LAB — COMPREHENSIVE METABOLIC PANEL
ALT: 34 IU/L (ref 0–44)
AST: 24 IU/L (ref 0–40)
Albumin/Globulin Ratio: 1.9 (ref 1.2–2.2)
Albumin: 3.9 g/dL (ref 3.9–4.9)
Alkaline Phosphatase: 78 IU/L (ref 44–121)
BUN/Creatinine Ratio: 14 (ref 10–24)
BUN: 15 mg/dL (ref 8–27)
Bilirubin Total: 1.3 mg/dL — ABNORMAL HIGH (ref 0.0–1.2)
CO2: 21 mmol/L (ref 20–29)
Calcium: 9.8 mg/dL (ref 8.6–10.2)
Chloride: 102 mmol/L (ref 96–106)
Creatinine, Ser: 1.05 mg/dL (ref 0.76–1.27)
Globulin, Total: 2.1 g/dL (ref 1.5–4.5)
Glucose: 206 mg/dL — ABNORMAL HIGH (ref 70–99)
Potassium: 4.2 mmol/L (ref 3.5–5.2)
Sodium: 139 mmol/L (ref 134–144)
Total Protein: 6 g/dL (ref 6.0–8.5)
eGFR: 80 mL/min/{1.73_m2} (ref >59)

## 2023-01-20 LAB — T4, FREE: Free T4: 1.33 ng/dL (ref 0.82–1.77)

## 2023-01-20 LAB — LIPID PANEL
Chol/HDL Ratio: 3.3 ratio (ref 0.0–5.0)
Cholesterol, Total: 107 mg/dL (ref 100–199)
HDL: 32 mg/dL — ABNORMAL LOW (ref >39)
LDL Chol Calc (NIH): 48 mg/dL (ref 0–99)
Triglycerides: 155 mg/dL — ABNORMAL HIGH (ref 0–149)
VLDL Cholesterol Cal: 27 mg/dL (ref 5–40)

## 2023-01-20 LAB — TSH: TSH: 3.39 u[IU]/mL (ref 0.450–4.500)

## 2023-01-20 LAB — VITAMIN D 25 HYDROXY (VIT D DEFICIENCY, FRACTURES): Vit D, 25-Hydroxy: 52.6 ng/mL (ref 30.0–100.0)

## 2023-01-25 ENCOUNTER — Ambulatory Visit: Payer: BC Managed Care – PPO | Admitting: Nurse Practitioner

## 2023-01-25 ENCOUNTER — Encounter: Payer: Self-pay | Admitting: Nurse Practitioner

## 2023-01-25 VITALS — BP 130/80 | HR 96 | Ht 73.0 in | Wt 202.2 lb

## 2023-01-25 DIAGNOSIS — E119 Type 2 diabetes mellitus without complications: Secondary | ICD-10-CM

## 2023-01-25 DIAGNOSIS — E782 Mixed hyperlipidemia: Secondary | ICD-10-CM

## 2023-01-25 DIAGNOSIS — I10 Essential (primary) hypertension: Secondary | ICD-10-CM | POA: Diagnosis not present

## 2023-01-25 LAB — POCT GLYCOSYLATED HEMOGLOBIN (HGB A1C): Hemoglobin A1C: 8.5 % — AB (ref 4.0–5.6)

## 2023-01-25 MED ORDER — DAPAGLIFLOZIN PROPANEDIOL 10 MG PO TABS: 10.0000 mg | ORAL_TABLET | Freq: Every day | ORAL | 3 refills | Status: DC

## 2023-01-25 NOTE — Progress Notes (Signed)
Endocrinology Follow Up Note       01/25/2023, 8:45 AM   Subjective:    [name], male    DOB: 03-22-1961.  [name] [name] is being seen in follow up after being seen in consultation for management of currently uncontrolled symptomatic diabetes requested by  Tommie Sams, DO.   Past Medical History:  Diagnosis Date   Anxiety    Arthritis    Diabetes mellitus without complication (HCC)    Diabetes mellitus, type II (HCC)    Diverticulosis    pt unaware   ED (erectile dysfunction)    Fatty liver    GERD (gastroesophageal reflux disease)    History of COVID-19 07/2019   History of left inguinal hernia    History of retinal detachment    Right   Hyperlipidemia    Hypertension    Insomnia    Low ferritin level    Numbness and tingling of both feet    Shortness of breath dyspnea    since having COVID 07/2019 with exertions    Past Surgical History:  Procedure Laterality Date   BIOPSY  04/21/2022   Procedure: BIOPSY;  Surgeon: Lanelle Bal, DO;  Location: [facility];  Service: Endoscopy;;   CHOLECYSTECTOMY     COLONOSCOPY N/A 11/30/2014   Procedure: COLONOSCOPY;  Surgeon: Corbin Ade, MD;  Location: [facility];  Service: Endoscopy;  Laterality: N/A;  7:30 Am   COLONOSCOPY WITH PROPOFOL N/A 04/21/2022   Procedure: COLONOSCOPY WITH PROPOFOL;  Surgeon: Lanelle Bal, DO;  Location: [facility];  Service: Endoscopy;  Laterality: N/A;  9:00am   ESOPHAGEAL DILATION     HERNIA REPAIR     1981   POLYPECTOMY  04/21/2022   Procedure: POLYPECTOMY;  Surgeon: Lanelle Bal, DO;  Location: [facility];  Service: Endoscopy;;   RETINAL DETACHMENT SURGERY Right    TOTAL KNEE ARTHROPLASTY Left 12/11/2019   Procedure: TOTAL KNEE ARTHROPLASTY;  Surgeon: Ollen Gross, MD;  Location: [facility];  Service: Orthopedics;  Laterality: Left;    VASCULAR SURGERY      Social History    Socioeconomic History   Marital status: Married    Spouse name: Not on file   Number of children: Not on file   Years of education: Not on file   Highest education level: Not on file  Occupational History   Not on file  Tobacco Use   Smoking status: Never    Passive exposure: Never   Smokeless tobacco: Never  Vaping Use   Vaping Use: Never used  Substance and Sexual Activity   Alcohol use: No   Drug use: No   Sexual activity: Never  Other Topics Concern   Not on file  Social History Narrative   Not on file   Social Determinants of Health   Financial Resource Strain: Not on file  Food Insecurity: Not on file  Transportation Needs: Not on file  Physical Activity: Not on file  Stress: Not on file  Social Connections: Not on file    Family History  Problem Relation Age of Onset   Diabetes Mother    Neuropathy Mother  Outpatient Encounter Medications as of 01/25/2023  Medication Sig   acetaminophen (TYLENOL) 500 MG tablet Take 1,000 mg by mouth every morning.   atorvastatin (LIPITOR) 20 MG tablet TAKE 1 TABLET BY MOUTH AT BEDTIME.   blood glucose meter kit and supplies KIT Dispense based on patient and insurance preference. Test sugar up to one time a day   CONTOUR NEXT TEST test strip USE TO TEST BLOOD SUGAR ONCE DAILY.   dapagliflozin propanediol (FARXIGA) 10 MG TABS tablet Take 1 tablet (10 mg total) by mouth daily before breakfast.   dutasteride (AVODART) 0.5 MG capsule TAKE 1 CAPSULE BY MOUTH ONCE DAILY.   fluconazole (DIFLUCAN) 150 MG tablet Take 1 tablet (150 mg total) by mouth once a week.   fluticasone (FLONASE) 50 MCG/ACT nasal spray USE 2 SPRAYS IN EACH NOSTRIL EVERY DAY, AS NEEDED.   glipiZIDE (GLUCOTROL) 5 MG tablet Take 1 tablet (5 mg total) by mouth 2 (two) times daily before a meal.   LORazepam (ATIVAN) 1 MG tablet TAKE 1/2-1 TABLET BY MOUTH AT BEDTIME AS NEEDED FOR ANXIETY/INSOMNIA.   losartan (COZAAR) 50 MG tablet TAKE 1 TABLET BY MOUTH ONCE DAILY.    metFORMIN (GLUCOPHAGE) 500 MG tablet Take 2 tablets (1,000 mg total) by mouth 2 (two) times daily.   omeprazole (PRILOSEC) 20 MG capsule TAKE 1 CAPSULE 2 TIMES A DAY BEFORE MEALS.   ONETOUCH DELICA LANCETS 33G MISC    tamsulosin (FLOMAX) 0.4 MG CAPS capsule take 2 capsules at bedtime   traMADol (ULTRAM) 50 MG tablet Take 1 tablet (50 mg total) by mouth every 12 (twelve) hours as needed for moderate pain or severe pain. for pain   vitamin B-12 (CYANOCOBALAMIN) 1000 MCG tablet Take 1,000 mcg by mouth daily. Chewable   Vitamin D, Ergocalciferol, 50000 units CAPS TAKE 1 CAPSULE (50,000 UNITS TOTAL) BY MOUTH EVERY 7 DAYS.   [DISCONTINUED] dapagliflozin propanediol (FARXIGA) 5 MG TABS tablet Take 1 tablet (5 mg total) by mouth daily before breakfast.   Semaglutide 3 MG TABS Take 1 tablet (3 mg total) by mouth daily before breakfast. (Patient not taking: Reported on 01/25/2023)   No facility-administered encounter medications on file as of 01/25/2023.    ALLERGIES: Allergies  Allergen Reactions   Dilaudid [Hydromorphone Hcl] Other (See Comments)    PT SAID HE PASSED OUT WHEN HE TOOK IT    VACCINATION STATUS: Immunization History  Administered Date(s) Administered   Influenza, Seasonal, Injecte, Preservative Fre 06/19/2016   Influenza,inj,Quad PF,6+ Mos 06/17/2017, 06/20/2018, 07/12/2019, 07/19/2020, 08/19/2021, 08/20/2022    Diabetes He presents for his follow-up diabetic visit. He has type 2 diabetes mellitus. Onset time: Diagnosed at approx age of 37. His disease course has been improving. There are no hypoglycemic associated symptoms. Pertinent negatives for diabetes include no fatigue, no foot paresthesias and no polydipsia. There are no hypoglycemic complications. Symptoms are stable. Diabetic complications include impotence, nephropathy and peripheral neuropathy. Risk factors for coronary artery disease include diabetes mellitus, dyslipidemia, family history, male sex and hypertension.  Current diabetic treatment includes oral agent (triple therapy). He is compliant with treatment most of the time. His weight is fluctuating minimally. He is following a generally healthy diet. When asked about meal planning, he reported none. He has not had a previous visit with a dietitian. He participates in exercise intermittently. His home blood glucose trend is decreasing steadily. His breakfast blood glucose range is generally >200 mg/dl. (He presents today with his meter and logs showing improved, but still above target, glycemic profile.  His POCT A1c today is 8.5%, improving from last visit of 9.3%.  Analysis of his meter shows 14-day average of 182.  He does not note any unpleasant side effects of the [place].) An ACE inhibitor/angiotensin II receptor blocker is being taken. He does not see a podiatrist.Eye exam is current.  Hyperlipidemia This is a chronic problem. The current episode started more than 1 year ago. The problem is uncontrolled. Recent lipid tests were reviewed and are variable. Exacerbating diseases include chronic renal disease, diabetes and liver disease. fatty liver. Factors aggravating his hyperlipidemia include fatty foods. Current antihyperlipidemic treatment includes statins and diet change. The current treatment provides mild improvement of lipids. Compliance problems include adherence to diet and adherence to exercise.  Risk factors for coronary artery disease include diabetes mellitus, dyslipidemia, family history, male sex, obesity and hypertension.  Hypertension This is a chronic problem. The current episode started more than 1 year ago. The problem has been resolved since onset. The problem is controlled. There are no associated agents to hypertension. Risk factors for coronary artery disease include dyslipidemia, diabetes mellitus, family history and male gender. Past treatments include angiotensin blockers. The current treatment provides moderate improvement. There are no  compliance problems.  Hypertensive end-organ damage includes kidney disease. Identifiable causes of hypertension include chronic renal disease.    Review of systems  Constitutional: + Minimally fluctuating body weight,  current Body mass index is 26.68 kg/m. , no fatigue, no subjective hyperthermia, no subjective hypothermia Eyes: no blurry vision, no xerophthalmia ENT: no sore throat, no nodules palpated in throat, no dysphagia/odynophagia, no hoarseness Cardiovascular: no chest pain, no shortness of breath, no palpitations, no leg swelling Respiratory: no cough, no shortness of breath Gastrointestinal: no nausea/vomiting/diarrhea Musculoskeletal: no muscle/joint aches Skin: no rashes, no hyperemia Neurological: no tremors, no numbness, no tingling, no dizziness Psychiatric: no depression, no anxiety   Objective:     BP 130/80 (BP Location: Left Arm, Patient Position: Sitting, Cuff Size: Large)   Pulse 96   Ht 6\' 1"  (1.854 m)   Wt 202 lb 3.2 oz (91.7 kg)   BMI 26.68 kg/m   Wt Readings from Last 3 Encounters:  01/25/23 202 lb 3.2 oz (91.7 kg)  10/19/22 209 lb 9.6 oz (95.1 kg)  08/20/22 209 lb (94.8 kg)     BP Readings from Last 3 Encounters:  01/25/23 130/80  10/19/22 110/80  08/20/22 116/80      Physical Exam- Limited  Constitutional:  Body mass index is 26.68 kg/m. , not in acute distress, normal state of mind Eyes:  EOMI, no exophthalmos Musculoskeletal: no gross deformities, strength intact in all four extremities, no gross restriction of joint movements Skin:  no rashes, no hyperemia Neurological: no tremor with outstretched hands   Diabetic Foot Exam - Simple   No data filed    CMP ( most recent) CMP     Component Value Date/Time   NA 139 01/19/2023 0826   K 4.2 01/19/2023 0826   CL 102 01/19/2023 0826   CO2 21 01/19/2023 0826   GLUCOSE 206 (H) 01/19/2023 0826   GLUCOSE 161 (H) 12/12/2019 0302   BUN 15 01/19/2023 0826   CREATININE 1.05 01/19/2023  0826   CREATININE 0.78 04/18/2014 0719   CALCIUM 9.8 01/19/2023 0826   PROT 6.0 01/19/2023 0826   ALBUMIN 3.9 01/19/2023 0826   ALBUMIN 30mg /L 06/18/2022 1112   AST 24 01/19/2023 0826   ALT 34 01/19/2023 0826   ALKPHOS 78 01/19/2023 0826   BILITOT  1.3 (H) 01/19/2023 0826   GFRNONAA 88 06/11/2020 1204   GFRAA 101 06/11/2020 1204     Diabetic Labs (most recent): Lab Results  Component Value Date   HGBA1C 8.5 (A) 01/25/2023   HGBA1C 9.3 (A) 10/19/2022   HGBA1C 7.9 (A) 06/18/2022     Lipid Panel ( most recent) Lipid Panel     Component Value Date/Time   CHOL 107 01/19/2023 0826   TRIG 155 (H) 01/19/2023 0826   HDL 32 (L) 01/19/2023 0826   CHOLHDL 3.3 01/19/2023 0826   CHOLHDL 5.2 04/18/2014 0719   VLDL 41 (H) 04/18/2014 0719   LDLCALC 48 01/19/2023 0826   LABVLDL 27 01/19/2023 0826      Lab Results  Component Value Date   TSH 3.390 01/19/2023   TSH 2.760 01/15/2022   TSH 3.940 12/23/2020   FREET4 1.33 01/19/2023   FREET4 1.20 01/15/2022           Assessment & Plan:   1) Type 2 diabetes mellitus without complication, without long-term current use of insulin (HCC)  He presents today with his meter and logs showing improved, but still above target, glycemic profile.  His POCT A1c today is 8.5%, improving from last visit of 9.3%.  Analysis of his meter shows 14-day average of 182.  He does not note any unpleasant side effects of the Farxiga.  - Slayde W Hovis has currently uncontrolled symptomatic type 2 DM since 62 years of age.   -Recent labs reviewed.  - I had a long discussion with him about the progressive nature of diabetes and the pathology behind its complications. -his diabetes is complicated by neuropathy and mild CKD and he remains at a high risk for more acute and chronic complications which include CAD, CVA, CKD, retinopathy, and neuropathy. These are all discussed in detail with him.  - Nutritional counseling repeated at each appointment due to  patients tendency to fall back in to old habits.  - The patient admits there is a room for improvement in their diet and drink choices. -  Suggestion is made for the patient to avoid simple carbohydrates from their diet including Cakes, Sweet Desserts / Pastries, Ice Cream, Soda (diet and regular), Sweet Tea, Candies, Chips, Cookies, Sweet Pastries, Store Bought Juices, Alcohol in Excess of 1-2 drinks a day, Artificial Sweeteners, Coffee Creamer, and "Sugar-free" Products. This will help patient to have stable blood glucose profile and potentially avoid unintended weight gain.   - I encouraged the patient to switch to unprocessed or minimally processed complex starch and increased protein intake (animal or plant source), fruits, and vegetables.   - Patient is advised to stick to a routine mealtimes to eat 3 meals a day and avoid unnecessary snacks (to snack only to correct hypoglycemia).  - he will be scheduled with Norm Salt, RDN, CDE for diabetes education.  - I have approached him with the following individualized plan to manage  his diabetes and patient agrees:   -He is advised to continue his Metformin 1000 mg po twice daily with meals and Glipizide to 5 mg po twice daily.  Will increase his Farxiga to 10 mg po daily.  We also discussed potentially adding basal insulin on at next visit if we cannot regain control on oral antidiabetic medications.  -he is encouraged to continue monitoring blood glucose at least once daily, before breakfast and to call the clinic if he has readings less than 90 or greater than 200 for 3 tests in a row.  -  Adjustment parameters are given to him for hypo and hyperglycemia in writing.  Insurance did not provide optimal coverage for Rybelsus.  - Specific targets for  A1c;  LDL, HDL,  and Triglycerides were discussed with the patient.  2) Blood Pressure /Hypertension:  his blood pressure is controlled to target.   he is advised to continue his current  medications including Losartan 50 mg p.o. daily with breakfast.  3) Lipids/Hyperlipidemia:    Review of his recent lipid panel from 01/19/23 showed controlled  LDL at 48 and slightly elevated triglycerides of 155.  he  is advised to continue Lipitor 20 mg daily at bedtime.  Side effects and precautions discussed with him.  He is also encouraged to avoid fried foods and butter.    4)  Weight/Diet:  his Body mass index is 26.68 kg/m.  -   he is a candidate for some weight loss. I discussed with him the fact that loss of 5 - 10% of his  current body weight will have the most impact on his diabetes management.  Exercise, and detailed carbohydrates information provided  -  detailed on discharge instructions.  5) Chronic Care/Health Maintenance: -he is on ACEI/ARB and Statin medications and is encouraged to initiate and continue to follow up with Ophthalmology, Dentist, Podiatrist at least yearly or according to recommendations, and advised to stay away from smoking. I have recommended yearly flu vaccine and pneumonia vaccine at least every 5 years; moderate intensity exercise for up to 150 minutes weekly; and sleep for at least 7 hours a day.    - he is advised to maintain close follow up with Tommie Sams, DO for primary care needs, as well as his other providers for optimal and coordinated care.     I spent  46  minutes in the care of the patient today including review of labs from CMP, Lipids, Thyroid Function, Hematology (current and previous including abstractions from other facilities); face-to-face time discussing  his blood glucose readings/logs, discussing hypoglycemia and hyperglycemia episodes and symptoms, medications doses, his options of short and long term treatment based on the latest standards of care / guidelines;  discussion about incorporating lifestyle medicine;  and documenting the encounter. Risk reduction counseling performed per USPSTF guidelines to reduce obesity and  cardiovascular risk factors.     Please refer to Patient Instructions for Blood Glucose Monitoring and Insulin/Medications Dosing Guide"  in media tab for additional information. Please  also refer to " Patient Self Inventory" in the Media  tab for reviewed elements of pertinent patient history.  [name] [name] participated in the discussions, expressed understanding, and voiced agreement with the above plans.  All questions were answered to his satisfaction. he is encouraged to contact clinic should he have any questions or concerns prior to his return visit.   Follow up plan: - Return in about 3 months (around 04/27/2023) for Diabetes F/U with A1c in office, No previsit labs, Bring meter and logs.  Ronny Bacon, [facility] [address] [place], [place] [address] Phone: [phone] Fax: [phone]  01/25/2023, 8:45 AM

## 2023-01-29 ENCOUNTER — Other Ambulatory Visit: Payer: Self-pay | Admitting: Nurse Practitioner

## 2023-01-29 DIAGNOSIS — E119 Type 2 diabetes mellitus without complications: Secondary | ICD-10-CM

## 2023-02-08 ENCOUNTER — Other Ambulatory Visit: Payer: Self-pay | Admitting: Family Medicine

## 2023-02-18 ENCOUNTER — Ambulatory Visit: Payer: BC Managed Care – PPO | Admitting: Family Medicine

## 2023-02-18 VITALS — BP 107/71 | HR 86 | Temp 97.1°F | Ht 73.0 in | Wt 201.0 lb

## 2023-02-18 DIAGNOSIS — M1712 Unilateral primary osteoarthritis, left knee: Secondary | ICD-10-CM

## 2023-02-18 DIAGNOSIS — E119 Type 2 diabetes mellitus without complications: Secondary | ICD-10-CM

## 2023-02-18 DIAGNOSIS — I10 Essential (primary) hypertension: Secondary | ICD-10-CM | POA: Diagnosis not present

## 2023-02-18 DIAGNOSIS — E785 Hyperlipidemia, unspecified: Secondary | ICD-10-CM

## 2023-02-18 DIAGNOSIS — G4700 Insomnia, unspecified: Secondary | ICD-10-CM

## 2023-02-18 DIAGNOSIS — Z7984 Long term (current) use of oral hypoglycemic drugs: Secondary | ICD-10-CM

## 2023-02-18 MED ORDER — DAPAGLIFLOZIN PROPANEDIOL 10 MG PO TABS: 10.0000 mg | ORAL_TABLET | Freq: Every day | ORAL | 3 refills | Status: DC

## 2023-02-18 MED ORDER — METFORMIN HCL 500 MG PO TABS: 1000.0000 mg | ORAL_TABLET | Freq: Two times a day (BID) | ORAL | 3 refills | Status: DC

## 2023-02-18 MED ORDER — LORAZEPAM 1 MG PO TABS
ORAL_TABLET | ORAL | 3 refills | Status: DC
Start: 2023-02-18 — End: 2023-09-08

## 2023-02-18 MED ORDER — ATORVASTATIN CALCIUM 20 MG PO TABS: 20.0000 mg | ORAL_TABLET | Freq: Every day | ORAL | 3 refills | Status: DC

## 2023-02-18 MED ORDER — TRAMADOL HCL 50 MG PO TABS: 50.0000 mg | ORAL_TABLET | Freq: Two times a day (BID) | ORAL | 3 refills | Status: DC | PRN

## 2023-02-18 MED ORDER — SEMAGLUTIDE 3 MG PO TABS: 3.0000 mg | ORAL_TABLET | Freq: Every day | ORAL | 3 refills | Status: DC

## 2023-02-18 MED ORDER — GLIPIZIDE 5 MG PO TABS: ORAL_TABLET | ORAL | 0 refills | Status: DC

## 2023-02-18 MED ORDER — TAMSULOSIN HCL 0.4 MG PO CAPS: 0.8000 mg | ORAL_CAPSULE | Freq: Every day | ORAL | 3 refills | Status: DC

## 2023-02-18 MED ORDER — LOSARTAN POTASSIUM 50 MG PO TABS: 50.0000 mg | ORAL_TABLET | Freq: Every day | ORAL | 3 refills | Status: DC

## 2023-02-18 MED ORDER — DUTASTERIDE 0.5 MG PO CAPS: 0.5000 mg | ORAL_CAPSULE | Freq: Every day | ORAL | 3 refills | Status: DC

## 2023-02-18 MED ORDER — OMEPRAZOLE 20 MG PO CPDR: 20.0000 mg | DELAYED_RELEASE_CAPSULE | Freq: Two times a day (BID) | ORAL | 3 refills | Status: DC

## 2023-02-18 NOTE — Assessment & Plan Note (Signed)
LDL at goal. Continue Lipitor.  

## 2023-02-18 NOTE — Assessment & Plan Note (Signed)
Not yet at goal.  Continue close follow-up with endocrinology.  Medications refilled.

## 2023-02-18 NOTE — Assessment & Plan Note (Signed)
Tramadol refilled.

## 2023-02-18 NOTE — Progress Notes (Signed)
Subjective:  [name], male    DOB: 24-Feb-1961  Age: 62 y.o. MRN: [id number]  CC: Chief Complaint  Patient presents with   Hypertension   Fatigue    Feels very exhausted in the evenings , knee pain - recent knee surgery - takes tylenol and tramadol - work full time- self emp.    HPI:  62 year old male with the below mentioned past medical history presents for follow-up.  Patient reports that he is doing okay.  He does note that he has been quite tired in the evenings.  He is also had times where he has had upper back pain between his scapula which requires him to rest.  Subsequently improves.  Patient following with endocrinology.  A1c still not at goal.  He is on Farxiga, semaglutide, metformin, and glipizide.  Blood pressure well-controlled on losartan.  LDL at goal on Lipitor.  Needs refill on tramadol which he uses for osteoarthritis.  Patient Active Problem List   Diagnosis Date Noted   GERD (gastroesophageal reflux disease) 08/20/2022   Iron deficiency 08/20/2022   Insomnia 08/20/2022   Nocturia associated with benign prostatic hyperplasia 12/20/2020   Osteoarthritis of left knee 12/11/2019   Hyperlipidemia 06/20/2018   Erectile dysfunction 12/16/2016   Essential hypertension 06/19/2016   Type 2 diabetes mellitus (HCC) 03/20/2015    Social Hx   Social History   Socioeconomic History   Marital status: Married    Spouse name: Not on file   Number of children: Not on file   Years of education: Not on file   Highest education level: 12th grade  Occupational History   Not on file  Tobacco Use   Smoking status: Never    Passive exposure: Never   Smokeless tobacco: Never  Vaping Use   Vaping Use: Never used  Substance and Sexual Activity   Alcohol use: No   Drug use: No   Sexual activity: Never  Other Topics Concern   Not on file  Social History Narrative   Not on file   Social Determinants of Health   Financial Resource Strain: Low  Risk  (02/17/2023)   Overall Financial Resource Strain (CARDIA)    Difficulty of Paying Living Expenses: Not very hard  Food Insecurity: No Food Insecurity (02/17/2023)   Hunger Vital Sign    Worried About Running Out of Food in the Last Year: Never true    Ran Out of Food in the Last Year: Never true  Transportation Needs: No Transportation Needs (02/17/2023)   PRAPARE - Administrator, Civil Service (Medical): No    Lack of Transportation (Non-Medical): No  Physical Activity: Insufficiently Active (02/17/2023)   Exercise Vital Sign    Days of Exercise per Week: 3 days    Minutes of Exercise per Session: 30 min  Stress: No Stress Concern Present (02/17/2023)   [employer] of Occupational Health - Occupational Stress Questionnaire    Feeling of Stress : Not at all  Social Connections: Socially Integrated (02/17/2023)   Social Connection and Isolation Panel [NHANES]    Frequency of Communication with Friends and Family: More than three times a week    Frequency of Social Gatherings with Friends and Family: More than three times a week    Attends Religious Services: More than 4 times per year    Active Member of [employer] or Organizations: Yes    Attends Banker Meetings: More than 4 times per year    Marital  Status: Married    Review of Systems Per HPI  Objective:  BP 107/71   Pulse 86   Temp (!) 97.1 F (36.2 C)   Ht 6\' 1"  (1.854 m)   Wt 201 lb (91.2 kg)   SpO2 98%   BMI 26.52 kg/m      02/18/2023    8:21 AM 01/25/2023    7:59 AM 10/19/2022   11:23 AM  BP/Weight  Systolic BP 107 130 110  Diastolic BP 71 80 80  Wt. (Lbs) 201 202.2 209.6  BMI 26.52 kg/m2 26.68 kg/m2 27.65 kg/m2    Physical Exam Vitals and nursing note reviewed.  Constitutional:      General: He is not in acute distress.    Appearance: Normal appearance.  HENT:     Head: Normocephalic and atraumatic.  Eyes:     General:        Right eye: No discharge.        Left eye: No  discharge.     Conjunctiva/sclera: Conjunctivae normal.  Cardiovascular:     Rate and Rhythm: Normal rate and regular rhythm.  Pulmonary:     Effort: Pulmonary effort is normal.     Breath sounds: Normal breath sounds. No wheezing, rhonchi or rales.  Neurological:     Mental Status: He is alert.  Psychiatric:        Mood and Affect: Mood normal.        Behavior: Behavior normal.     Lab Results  Component Value Date   WBC 8.8 02/17/2022   HGB 13.1 02/17/2022   HCT 40.4 02/17/2022   PLT 188 02/17/2022   GLUCOSE 206 (H) 01/19/2023   CHOL 107 01/19/2023   TRIG 155 (H) 01/19/2023   HDL 32 (L) 01/19/2023   LDLCALC 48 01/19/2023   ALT 34 01/19/2023   AST 24 01/19/2023   NA 139 01/19/2023   K 4.2 01/19/2023   CL 102 01/19/2023   CREATININE 1.05 01/19/2023   BUN 15 01/19/2023   CO2 21 01/19/2023   TSH 3.390 01/19/2023   PSA 0.79 04/18/2014   INR 1.2 12/04/2019   HGBA1C 8.5 (A) 01/25/2023     Assessment & Plan:   Problem List Items Addressed This Visit       Cardiovascular and Mediastinum   Essential hypertension - Primary    Well-controlled.  Continue losartan.  Refilled today.      Relevant Medications   atorvastatin (LIPITOR) 20 MG tablet   losartan (COZAAR) 50 MG tablet     Endocrine   Type 2 diabetes mellitus (HCC)    Not yet at goal.  Continue close follow-up with endocrinology.  Medications refilled.      Relevant Medications   atorvastatin (LIPITOR) 20 MG tablet   dapagliflozin propanediol (FARXIGA) 10 MG TABS tablet   glipiZIDE (GLUCOTROL) 5 MG tablet   losartan (COZAAR) 50 MG tablet   metFORMIN (GLUCOPHAGE) 500 MG tablet   Semaglutide 3 MG TABS     Musculoskeletal and Integument   Osteoarthritis of left knee    Tramadol refilled.      Relevant Medications   traMADol (ULTRAM) 50 MG tablet     Other   Insomnia   Relevant Medications   LORazepam (ATIVAN) 1 MG tablet   Hyperlipidemia    LDL at goal.  Continue Lipitor.      Relevant  Medications   atorvastatin (LIPITOR) 20 MG tablet   losartan (COZAAR) 50 MG tablet    Meds ordered this  encounter  Medications   atorvastatin (LIPITOR) 20 MG tablet    Sig: Take 1 tablet (20 mg total) by mouth at bedtime.    Dispense:  90 tablet    Refill:  3    NA   dapagliflozin propanediol (FARXIGA) 10 MG TABS tablet    Sig: Take 1 tablet (10 mg total) by mouth daily before breakfast.    Dispense:  90 tablet    Refill:  3   dutasteride (AVODART) 0.5 MG capsule    Sig: Take 1 capsule (0.5 mg total) by mouth daily.    Dispense:  90 capsule    Refill:  3   glipiZIDE (GLUCOTROL) 5 MG tablet    Sig: TAKE 1 TABLET 2 TIMES A DAY BEFORE A MEAL.    Dispense:  180 tablet    Refill:  0    NA   losartan (COZAAR) 50 MG tablet    Sig: Take 1 tablet (50 mg total) by mouth daily.    Dispense:  90 tablet    Refill:  3    NA   metFORMIN (GLUCOPHAGE) 500 MG tablet    Sig: Take 2 tablets (1,000 mg total) by mouth 2 (two) times daily.    Dispense:  360 tablet    Refill:  3   omeprazole (PRILOSEC) 20 MG capsule    Sig: Take 1 capsule (20 mg total) by mouth 2 (two) times daily before a meal.    Dispense:  180 capsule    Refill:  3    NA   Semaglutide 3 MG TABS    Sig: Take 1 tablet (3 mg total) by mouth daily before breakfast.    Dispense:  90 tablet    Refill:  3   tamsulosin (FLOMAX) 0.4 MG CAPS capsule    Sig: Take 2 capsules (0.8 mg total) by mouth at bedtime.    Dispense:  180 capsule    Refill:  3   traMADol (ULTRAM) 50 MG tablet    Sig: Take 1 tablet (50 mg total) by mouth every 12 (twelve) hours as needed for moderate pain or severe pain.    Dispense:  180 tablet    Refill:  3    NA   LORazepam (ATIVAN) 1 MG tablet    Sig: TAKE 1/2-1 TABLET BY MOUTH AT BEDTIME AS NEEDED FOR ANXIETY/INSOMNIA.    Dispense:  90 tablet    Refill:  3    Follow-up:  Return in about 6 months (around 08/21/2023).  Everlene Other DO [facility]

## 2023-02-18 NOTE — Assessment & Plan Note (Signed)
Well-controlled.  Continue losartan.  Refilled today.

## 2023-02-18 NOTE — Patient Instructions (Signed)
Continue your medications.  Follow up in 6 months.  Take care  Dr. Melita Villalona  

## 2023-03-16 ENCOUNTER — Other Ambulatory Visit: Payer: Self-pay | Admitting: Nurse Practitioner

## 2023-04-27 ENCOUNTER — Ambulatory Visit: Payer: BC Managed Care – PPO | Admitting: Nurse Practitioner

## 2023-04-27 ENCOUNTER — Encounter: Payer: Self-pay | Admitting: Nurse Practitioner

## 2023-04-27 VITALS — BP 102/70 | HR 83 | Ht 73.0 in | Wt 197.2 lb

## 2023-04-27 DIAGNOSIS — E782 Mixed hyperlipidemia: Secondary | ICD-10-CM

## 2023-04-27 DIAGNOSIS — Z7984 Long term (current) use of oral hypoglycemic drugs: Secondary | ICD-10-CM | POA: Diagnosis not present

## 2023-04-27 DIAGNOSIS — I10 Essential (primary) hypertension: Secondary | ICD-10-CM

## 2023-04-27 DIAGNOSIS — E119 Type 2 diabetes mellitus without complications: Secondary | ICD-10-CM | POA: Diagnosis not present

## 2023-04-27 LAB — POCT GLYCOSYLATED HEMOGLOBIN (HGB A1C): Hemoglobin A1C: 8.3 % — AB (ref 4.0–5.6)

## 2023-04-27 MED ORDER — DAPAGLIFLOZIN PROPANEDIOL 10 MG PO TABS: 10.0000 mg | ORAL_TABLET | Freq: Every day | ORAL | 3 refills | Status: DC

## 2023-04-27 MED ORDER — GLIPIZIDE 5 MG PO TABS
5.0000 mg | ORAL_TABLET | Freq: Two times a day (BID) | ORAL | 3 refills | Status: DC
Start: 2023-04-27 — End: 2023-07-28

## 2023-04-27 MED ORDER — METFORMIN HCL 500 MG PO TABS: 1000.0000 mg | ORAL_TABLET | Freq: Two times a day (BID) | ORAL | 3 refills | Status: DC

## 2023-04-27 NOTE — Progress Notes (Signed)
Endocrinology Follow Up Note       04/27/2023, 9:16 AM   Subjective:    [name], male    DOB: Sep 20, 1961.  [name] [name] is being seen in follow up after being seen in consultation for management of currently uncontrolled symptomatic diabetes requested by  Tommie Sams, DO.   Past Medical History:  Diagnosis Date   Anxiety    Arthritis    Diabetes mellitus without complication (HCC)    Diabetes mellitus, type II (HCC)    Diverticulosis    pt unaware   ED (erectile dysfunction)    Fatty liver    GERD (gastroesophageal reflux disease)    History of COVID-19 07/2019   History of left inguinal hernia    History of retinal detachment    Right   Hyperlipidemia    Hypertension    Insomnia    Low ferritin level    Numbness and tingling of both feet    Shortness of breath dyspnea    since having COVID 07/2019 with exertions    Past Surgical History:  Procedure Laterality Date   BIOPSY  04/21/2022   Procedure: BIOPSY;  Surgeon: Lanelle Bal, DO;  Location: [facility];  Service: Endoscopy;;   CHOLECYSTECTOMY     COLONOSCOPY N/A 11/30/2014   Procedure: COLONOSCOPY;  Surgeon: Corbin Ade, MD;  Location: [facility];  Service: Endoscopy;  Laterality: N/A;  7:30 Am   COLONOSCOPY WITH PROPOFOL N/A 04/21/2022   Procedure: COLONOSCOPY WITH PROPOFOL;  Surgeon: Lanelle Bal, DO;  Location: [facility];  Service: Endoscopy;  Laterality: N/A;  9:00am   ESOPHAGEAL DILATION     HERNIA REPAIR     1981   POLYPECTOMY  04/21/2022   Procedure: POLYPECTOMY;  Surgeon: Lanelle Bal, DO;  Location: [facility];  Service: Endoscopy;;   RETINAL DETACHMENT SURGERY Right    TOTAL KNEE ARTHROPLASTY Left 12/11/2019   Procedure: TOTAL KNEE ARTHROPLASTY;  Surgeon: Ollen Gross, MD;  Location: [facility];  Service: Orthopedics;  Laterality: Left;    VASCULAR SURGERY      Social History    Socioeconomic History   Marital status: Married    Spouse name: Not on file   Number of children: Not on file   Years of education: Not on file   Highest education level: 12th grade  Occupational History   Not on file  Tobacco Use   Smoking status: Never    Passive exposure: Never   Smokeless tobacco: Never  Vaping Use   Vaping status: Never Used  Substance and Sexual Activity   Alcohol use: No   Drug use: No   Sexual activity: Never  Other Topics Concern   Not on file  Social History Narrative   Not on file   Social Determinants of Health   Financial Resource Strain: Low Risk  (02/17/2023)   Overall Financial Resource Strain (CARDIA)    Difficulty of Paying Living Expenses: Not very hard  Food Insecurity: No Food Insecurity (02/17/2023)   Hunger Vital Sign    Worried About Running Out of Food in the Last Year: Never true    Ran  Out of Food in the Last Year: Never true  Transportation Needs: No Transportation Needs (02/17/2023)   PRAPARE - Administrator, Civil Service (Medical): No    Lack of Transportation (Non-Medical): No  Physical Activity: Insufficiently Active (02/17/2023)   Exercise Vital Sign    Days of Exercise per Week: 3 days    Minutes of Exercise per Session: 30 min  Stress: No Stress Concern Present (02/17/2023)   [employer] of Occupational Health - Occupational Stress Questionnaire    Feeling of Stress : Not at all  Social Connections: Socially Integrated (02/17/2023)   Social Connection and Isolation Panel [NHANES]    Frequency of Communication with Friends and Family: More than three times a week    Frequency of Social Gatherings with Friends and Family: More than three times a week    Attends Religious Services: More than 4 times per year    Active Member of [employer] or Organizations: Yes    Attends Engineer, structural: More than 4 times per year    Marital Status: Married    Family History  Problem Relation Age of Onset    Diabetes Mother    Neuropathy Mother     Outpatient Encounter Medications as of 04/27/2023  Medication Sig   acetaminophen (TYLENOL) 500 MG tablet Take 1,000 mg by mouth every morning.   atorvastatin (LIPITOR) 20 MG tablet Take 1 tablet (20 mg total) by mouth at bedtime.   CONTOUR NEXT TEST test strip USE TO TEST BLOOD SUGAR ONCE DAILY.   dutasteride (AVODART) 0.5 MG capsule Take 1 capsule (0.5 mg total) by mouth daily.   fluticasone (FLONASE) 50 MCG/ACT nasal spray USE 2 SPRAYS IN EACH NOSTRIL EVERY DAY, AS NEEDED.   LORazepam (ATIVAN) 1 MG tablet TAKE 1/2-1 TABLET BY MOUTH AT BEDTIME AS NEEDED FOR ANXIETY/INSOMNIA.   losartan (COZAAR) 50 MG tablet Take 1 tablet (50 mg total) by mouth daily.   omeprazole (PRILOSEC) 20 MG capsule Take 1 capsule (20 mg total) by mouth 2 (two) times daily before a meal.   ONETOUCH DELICA LANCETS 33G MISC    tamsulosin (FLOMAX) 0.4 MG CAPS capsule Take 2 capsules (0.8 mg total) by mouth at bedtime.   traMADol (ULTRAM) 50 MG tablet Take 1 tablet (50 mg total) by mouth every 12 (twelve) hours as needed for moderate pain or severe pain.   vitamin B-12 (CYANOCOBALAMIN) 1000 MCG tablet Take 1,000 mcg by mouth daily. Chewable   [DISCONTINUED] dapagliflozin propanediol (FARXIGA) 10 MG TABS tablet Take 1 tablet (10 mg total) by mouth daily before breakfast.   [DISCONTINUED] glipiZIDE (GLUCOTROL) 5 MG tablet TAKE 1 TABLET 2 TIMES A DAY BEFORE A MEAL.   [DISCONTINUED] metFORMIN (GLUCOPHAGE) 500 MG tablet Take 2 tablets (1,000 mg total) by mouth 2 (two) times daily.   dapagliflozin propanediol (FARXIGA) 10 MG TABS tablet Take 1 tablet (10 mg total) by mouth daily before breakfast.   glipiZIDE (GLUCOTROL) 5 MG tablet Take 1 tablet (5 mg total) by mouth 2 (two) times daily before a meal. TAKE 1 TABLET 2 TIMES A DAY BEFORE A MEAL.   metFORMIN (GLUCOPHAGE) 500 MG tablet Take 2 tablets (1,000 mg total) by mouth 2 (two) times daily with a meal.   [DISCONTINUED] Semaglutide 3 MG  TABS Take 1 tablet (3 mg total) by mouth daily before breakfast.   No facility-administered encounter medications on file as of 04/27/2023.    ALLERGIES: Allergies  Allergen Reactions   Dilaudid [Hydromorphone Hcl] Other (See  Comments)    PT SAID HE PASSED OUT WHEN HE TOOK IT    VACCINATION STATUS: Immunization History  Administered Date(s) Administered   Influenza, Seasonal, Injecte, Preservative Fre 06/19/2016   Influenza,inj,Quad PF,6+ Mos 06/17/2017, 06/20/2018, 07/12/2019, 07/19/2020, 08/19/2021, 08/20/2022    Diabetes He presents for his follow-up diabetic visit. He has type 2 diabetes mellitus. Onset time: Diagnosed at approx age of 76. His disease course has been improving. There are no hypoglycemic associated symptoms. Pertinent negatives for diabetes include no fatigue, no foot paresthesias and no polydipsia. There are no hypoglycemic complications. Symptoms are stable. Diabetic complications include impotence, nephropathy and peripheral neuropathy. Risk factors for coronary artery disease include diabetes mellitus, dyslipidemia, family history, male sex and hypertension. Current diabetic treatment includes oral agent (triple therapy). He is compliant with treatment most of the time. His weight is decreasing steadily. He is following a generally healthy diet. When asked about meal planning, he reported none. He has not had a previous visit with a dietitian. He participates in exercise intermittently. His home blood glucose trend is decreasing steadily. His breakfast blood glucose range is generally 140-180 mg/dl. (He presents today with no meter or logs to review (forgot them at home).  He notes his fasting readings have been in 170-180 range typically.  His POCT A1c today is 8.3%, improving from last visit of 8.5%.  He notes he still indulges in sweets occasionally, and does eat a snack before bed on most nights.  He denies any hypoglycemia but says he thinks his glucose drops after his  night time snack.) An ACE inhibitor/angiotensin II receptor blocker is being taken. He does not see a podiatrist.Eye exam is current.  Hyperlipidemia This is a chronic problem. The current episode started more than 1 year ago. The problem is uncontrolled. Recent lipid tests were reviewed and are variable. Exacerbating diseases include chronic renal disease, diabetes and liver disease. fatty liver. Factors aggravating his hyperlipidemia include fatty foods. Current antihyperlipidemic treatment includes statins and diet change. The current treatment provides mild improvement of lipids. Compliance problems include adherence to diet and adherence to exercise.  Risk factors for coronary artery disease include diabetes mellitus, dyslipidemia, family history, male sex, obesity and hypertension.  Hypertension This is a chronic problem. The current episode started more than 1 year ago. The problem has been resolved since onset. The problem is controlled. There are no associated agents to hypertension. Risk factors for coronary artery disease include dyslipidemia, diabetes mellitus, family history and male gender. Past treatments include angiotensin blockers. The current treatment provides moderate improvement. There are no compliance problems.  Hypertensive end-organ damage includes kidney disease. Identifiable causes of hypertension include chronic renal disease.   Review of systems  Constitutional: + decreasing body weight,  current Body mass index is 26.02 kg/m. , no fatigue, no subjective hyperthermia, no subjective hypothermia Eyes: no blurry vision, no xerophthalmia ENT: no sore throat, no nodules palpated in throat, no dysphagia/odynophagia, no hoarseness Cardiovascular: no chest pain, no shortness of breath, no palpitations, no leg swelling Respiratory: no cough, no shortness of breath Gastrointestinal: no nausea/vomiting/diarrhea Musculoskeletal: no muscle/joint aches Skin: no rashes, no  hyperemia Neurological: no tremors, no numbness, no tingling, no dizziness Psychiatric: no depression, no anxiety   Objective:     BP 102/70 (BP Location: Right Arm, Patient Position: Sitting, Cuff Size: Normal)   Pulse 83   Ht 6\' 1"  (1.854 m)   Wt 197 lb 3.2 oz (89.4 kg)   BMI 26.02 kg/m   Wt  Readings from Last 3 Encounters:  04/27/23 197 lb 3.2 oz (89.4 kg)  02/18/23 201 lb (91.2 kg)  01/25/23 202 lb 3.2 oz (91.7 kg)     BP Readings from Last 3 Encounters:  04/27/23 102/70  02/18/23 107/71  01/25/23 130/80      Physical Exam- Limited  Constitutional:  Body mass index is 26.02 kg/m. , not in acute distress, normal state of mind Eyes:  EOMI, no exophthalmos Musculoskeletal: no gross deformities, strength intact in all four extremities, no gross restriction of joint movements Skin:  no rashes, no hyperemia Neurological: no tremor with outstretched hands   Diabetic Foot Exam - Simple   No data filed    CMP ( most recent) CMP     Component Value Date/Time   NA 139 01/19/2023 0826   K 4.2 01/19/2023 0826   CL 102 01/19/2023 0826   CO2 21 01/19/2023 0826   GLUCOSE 206 (H) 01/19/2023 0826   GLUCOSE 161 (H) 12/12/2019 0302   BUN 15 01/19/2023 0826   CREATININE 1.05 01/19/2023 0826   CREATININE 0.78 04/18/2014 0719   CALCIUM 9.8 01/19/2023 0826   PROT 6.0 01/19/2023 0826   ALBUMIN 3.9 01/19/2023 0826   ALBUMIN 30mg /L 06/18/2022 1112   AST 24 01/19/2023 0826   ALT 34 01/19/2023 0826   ALKPHOS 78 01/19/2023 0826   BILITOT 1.3 (H) 01/19/2023 0826   GFRNONAA 88 06/11/2020 1204   GFRAA 101 06/11/2020 1204     Diabetic Labs (most recent): Lab Results  Component Value Date   HGBA1C 8.3 (A) 04/27/2023   HGBA1C 8.5 (A) 01/25/2023   HGBA1C 9.3 (A) 10/19/2022     Lipid Panel ( most recent) Lipid Panel     Component Value Date/Time   CHOL 107 01/19/2023 0826   TRIG 155 (H) 01/19/2023 0826   HDL 32 (L) 01/19/2023 0826   CHOLHDL 3.3 01/19/2023 0826    CHOLHDL 5.2 04/18/2014 0719   VLDL 41 (H) 04/18/2014 0719   LDLCALC 48 01/19/2023 0826   LABVLDL 27 01/19/2023 0826      Lab Results  Component Value Date   TSH 3.390 01/19/2023   TSH 2.760 01/15/2022   TSH 3.940 12/23/2020   FREET4 1.33 01/19/2023   FREET4 1.20 01/15/2022           Assessment & Plan:   1) Type 2 diabetes mellitus without complication, without long-term current use of insulin (HCC)  He presents today with no meter or logs to review (forgot them at home).  He notes his fasting readings have been in 170-180 range typically.  His POCT A1c today is 8.3%, improving from last visit of 8.5%.  He notes he still indulges in sweets occasionally, and does eat a snack before bed on most nights.  He denies any hypoglycemia but says he thinks his glucose drops after his night time snack.  - [name] [name] has currently uncontrolled symptomatic type 2 DM since 62 years of age.   -Recent labs reviewed.  - I had a long discussion with him about the progressive nature of diabetes and the pathology behind its complications. -his diabetes is complicated by neuropathy and mild CKD and he remains at a high risk for more acute and chronic complications which include CAD, CVA, CKD, retinopathy, and neuropathy. These are all discussed in detail with him.  - Nutritional counseling repeated at each appointment due to patients tendency to fall back in to old habits.  - The patient admits there is a  room for improvement in their diet and drink choices. -  Suggestion is made for the patient to avoid simple carbohydrates from their diet including Cakes, Sweet Desserts / Pastries, Ice Cream, Soda (diet and regular), Sweet Tea, Candies, Chips, Cookies, Sweet Pastries, Store Bought Juices, Alcohol in Excess of 1-2 drinks a day, Artificial Sweeteners, Coffee Creamer, and "Sugar-free" Products. This will help patient to have stable blood glucose profile and potentially avoid unintended weight gain.    - I encouraged the patient to switch to unprocessed or minimally processed complex starch and increased protein intake (animal or plant source), fruits, and vegetables.   - Patient is advised to stick to a routine mealtimes to eat 3 meals a day and avoid unnecessary snacks (to snack only to correct hypoglycemia).  - he will be scheduled with Norm Salt, RDN, CDE for diabetes education.  - I have approached him with the following individualized plan to manage  his diabetes and patient agrees:   -He is advised to continue his Metformin 1000 mg po twice daily with meals Glipizide to 5 mg po twice daily, and Farxiga 10 mg po daily.  We also discussed potentially adding basal insulin on at next visit if we cannot regain/maintain control on oral antidiabetic medications.  -he is encouraged to continue monitoring blood glucose at least once daily, before breakfast and to call the clinic if he has readings less than 90 or greater than 200 for 3 tests in a row.  - Adjustment parameters are given to him for hypo and hyperglycemia in writing.  Insurance did not provide optimal coverage for Rybelsus.  - Specific targets for  A1c;  LDL, HDL,  and Triglycerides were discussed with the patient.  2) Blood Pressure /Hypertension:  his blood pressure is controlled to target.   he is advised to continue his current medications including Losartan 50 mg p.o. daily with breakfast.  3) Lipids/Hyperlipidemia:    Review of his recent lipid panel from 01/19/23 showed controlled  LDL at 48 and slightly elevated triglycerides of 155.  he  is advised to continue Lipitor 20 mg daily at bedtime.  Side effects and precautions discussed with him.  He is also encouraged to avoid fried foods and butter.    4)  Weight/Diet:  his Body mass index is 26.02 kg/m.  -   he is a candidate for some weight loss. I discussed with him the fact that loss of 5 - 10% of his  current body weight will have the most impact on his  diabetes management.  Exercise, and detailed carbohydrates information provided  -  detailed on discharge instructions.  5) Chronic Care/Health Maintenance: -he is on ACEI/ARB and Statin medications and is encouraged to initiate and continue to follow up with Ophthalmology, Dentist, Podiatrist at least yearly or according to recommendations, and advised to stay away from smoking. I have recommended yearly flu vaccine and pneumonia vaccine at least every 5 years; moderate intensity exercise for up to 150 minutes weekly; and sleep for at least 7 hours a day.    - he is advised to maintain close follow up with Tommie Sams, DO for primary care needs, as well as his other providers for optimal and coordinated care.     I spent  41  minutes in the care of the patient today including review of labs from CMP, Lipids, Thyroid Function, Hematology (current and previous including abstractions from other facilities); face-to-face time discussing  his blood glucose readings/logs, discussing hypoglycemia  and hyperglycemia episodes and symptoms, medications doses, his options of short and long term treatment based on the latest standards of care / guidelines;  discussion about incorporating lifestyle medicine;  and documenting the encounter. Risk reduction counseling performed per USPSTF guidelines to reduce obesity and cardiovascular risk factors.     Please refer to Patient Instructions for Blood Glucose Monitoring and Insulin/Medications Dosing Guide"  in media tab for additional information. Please  also refer to " Patient Self Inventory" in the Media  tab for reviewed elements of pertinent patient history.  [name] [name] participated in the discussions, expressed understanding, and voiced agreement with the above plans.  All questions were answered to his satisfaction. he is encouraged to contact clinic should he have any questions or concerns prior to his return visit.   Follow up plan: - Return in  about 3 months (around 07/28/2023) for Diabetes F/U with A1c in office, No previsit labs, Bring meter and logs.  Ronny Bacon, [facility] [address] [place], [place] [address] Phone: [phone] Fax: [phone]  04/27/2023, 9:16 AM

## 2023-06-12 ENCOUNTER — Other Ambulatory Visit: Payer: Self-pay | Admitting: Family Medicine

## 2023-07-07 ENCOUNTER — Other Ambulatory Visit: Payer: Self-pay | Admitting: Family Medicine

## 2023-07-28 ENCOUNTER — Encounter: Payer: Self-pay | Admitting: Nurse Practitioner

## 2023-07-28 ENCOUNTER — Ambulatory Visit: Payer: BC Managed Care – PPO | Admitting: Nurse Practitioner

## 2023-07-28 VITALS — BP 105/68 | HR 81 | Ht 73.0 in | Wt 197.0 lb

## 2023-07-28 DIAGNOSIS — E119 Type 2 diabetes mellitus without complications: Secondary | ICD-10-CM

## 2023-07-28 DIAGNOSIS — I10 Essential (primary) hypertension: Secondary | ICD-10-CM

## 2023-07-28 DIAGNOSIS — E782 Mixed hyperlipidemia: Secondary | ICD-10-CM | POA: Diagnosis not present

## 2023-07-28 DIAGNOSIS — Z7984 Long term (current) use of oral hypoglycemic drugs: Secondary | ICD-10-CM

## 2023-07-28 LAB — POCT UA - MICROALBUMIN
Albumin/Creatinine Ratio, Urine, POC: <30
Creatinine, POC: 100 mg/dL
Microalbumin Ur, POC: 10 mg/L

## 2023-07-28 LAB — POCT GLYCOSYLATED HEMOGLOBIN (HGB A1C): Hemoglobin A1C: 8.9 % — AB (ref 4.0–5.6)

## 2023-07-28 MED ORDER — OZEMPIC (0.25 OR 0.5 MG/DOSE) 2 MG/3ML ~~LOC~~ SOPN: 0.5000 mg | PEN_INJECTOR | SUBCUTANEOUS | 3 refills | Status: DC

## 2023-07-28 MED ORDER — GLIPIZIDE 5 MG PO TABS
5.0000 mg | ORAL_TABLET | Freq: Two times a day (BID) | ORAL | 3 refills | Status: DC
Start: 2023-07-28 — End: 2024-01-25

## 2023-07-28 MED ORDER — DAPAGLIFLOZIN PROPANEDIOL 10 MG PO TABS: 10.0000 mg | ORAL_TABLET | Freq: Every day | ORAL | 3 refills | Status: DC

## 2023-07-28 NOTE — Progress Notes (Signed)
Endocrinology Follow Up Note       07/28/2023, 9:38 AM   Subjective:    [name], male    DOB: May 12, 1961.  [name] [name] is being seen in follow up after being seen in consultation for management of currently uncontrolled symptomatic diabetes requested by  Tommie Sams, DO.   Past Medical History:  Diagnosis Date   Anxiety    Arthritis    Diabetes mellitus without complication (HCC)    Diabetes mellitus, type II (HCC)    Diverticulosis    pt unaware   ED (erectile dysfunction)    Fatty liver    GERD (gastroesophageal reflux disease)    History of COVID-19 07/2019   History of left inguinal hernia    History of retinal detachment    Right   Hyperlipidemia    Hypertension    Insomnia    Low ferritin level    Numbness and tingling of both feet    Shortness of breath dyspnea    since having COVID 07/2019 with exertions    Past Surgical History:  Procedure Laterality Date   BIOPSY  04/21/2022   Procedure: BIOPSY;  Surgeon: Lanelle Bal, DO;  Location: [facility];  Service: Endoscopy;;   CHOLECYSTECTOMY     COLONOSCOPY N/A 11/30/2014   Procedure: COLONOSCOPY;  Surgeon: Corbin Ade, MD;  Location: [facility];  Service: Endoscopy;  Laterality: N/A;  7:30 Am   COLONOSCOPY WITH PROPOFOL N/A 04/21/2022   Procedure: COLONOSCOPY WITH PROPOFOL;  Surgeon: Lanelle Bal, DO;  Location: [facility];  Service: Endoscopy;  Laterality: N/A;  9:00am   ESOPHAGEAL DILATION     HERNIA REPAIR     1981   POLYPECTOMY  04/21/2022   Procedure: POLYPECTOMY;  Surgeon: Lanelle Bal, DO;  Location: [facility];  Service: Endoscopy;;   RETINAL DETACHMENT SURGERY Right    TOTAL KNEE ARTHROPLASTY Left 12/11/2019   Procedure: TOTAL KNEE ARTHROPLASTY;  Surgeon: Ollen Gross, MD;  Location: [facility];  Service: Orthopedics;  Laterality: Left;    VASCULAR SURGERY      Social History    Socioeconomic History   Marital status: Married    Spouse name: Not on file   Number of children: Not on file   Years of education: Not on file   Highest education level: 12th grade  Occupational History   Not on file  Tobacco Use   Smoking status: Never    Passive exposure: Never   Smokeless tobacco: Never  Vaping Use   Vaping status: Never Used  Substance and Sexual Activity   Alcohol use: No   Drug use: No   Sexual activity: Never  Other Topics Concern   Not on file  Social History Narrative   Not on file   Social Determinants of Health   Financial Resource Strain: Low Risk  (02/17/2023)   Overall Financial Resource Strain (CARDIA)    Difficulty of Paying Living Expenses: Not very hard  Food Insecurity: No Food Insecurity (02/17/2023)   Hunger Vital Sign    Worried About Running Out of Food in the Last Year: Never true    Ran  Out of Food in the Last Year: Never true  Transportation Needs: No Transportation Needs (02/17/2023)   PRAPARE - Administrator, Civil Service (Medical): No    Lack of Transportation (Non-Medical): No  Physical Activity: Insufficiently Active (02/17/2023)   Exercise Vital Sign    Days of Exercise per Week: 3 days    Minutes of Exercise per Session: 30 min  Stress: No Stress Concern Present (02/17/2023)   [employer] of Occupational Health - Occupational Stress Questionnaire    Feeling of Stress : Not at all  Social Connections: Socially Integrated (02/17/2023)   Social Connection and Isolation Panel [NHANES]    Frequency of Communication with Friends and Family: More than three times a week    Frequency of Social Gatherings with Friends and Family: More than three times a week    Attends Religious Services: More than 4 times per year    Active Member of [employer] or Organizations: Yes    Attends Engineer, structural: More than 4 times per year    Marital Status: Married    Family History  Problem Relation Age of Onset    Diabetes Mother    Neuropathy Mother     Outpatient Encounter Medications as of 07/28/2023  Medication Sig   Semaglutide,0.25 or 0.5MG /DOS, (OZEMPIC, 0.25 OR 0.5 MG/DOSE,) 2 MG/3ML SOPN Inject 0.5 mg into the skin once a week.   acetaminophen (TYLENOL) 500 MG tablet Take 1,000 mg by mouth every morning.   atorvastatin (LIPITOR) 20 MG tablet take 1 tablet by mouth at bedtime.   CONTOUR NEXT TEST test strip USE TO TEST BLOOD SUGAR ONCE DAILY.   dapagliflozin propanediol (FARXIGA) 10 MG TABS tablet Take 1 tablet (10 mg total) by mouth daily before breakfast.   dutasteride (AVODART) 0.5 MG capsule Take 1 capsule (0.5 mg total) by mouth daily.   fluticasone (FLONASE) 50 MCG/ACT nasal spray USE 2 SPRAYS IN EACH NOSTRIL EVERY DAY, AS NEEDED.   glipiZIDE (GLUCOTROL) 5 MG tablet Take 1 tablet (5 mg total) by mouth 2 (two) times daily before a meal. TAKE 1 TABLET 2 TIMES A DAY BEFORE A MEAL.   LORazepam (ATIVAN) 1 MG tablet TAKE 1/2-1 TABLET BY MOUTH AT BEDTIME AS NEEDED FOR ANXIETY/INSOMNIA.   losartan (COZAAR) 50 MG tablet Take 1 tablet (50 mg total) by mouth daily.   metFORMIN (GLUCOPHAGE) 500 MG tablet Take 2 tablets (1,000 mg total) by mouth 2 (two) times daily with a meal.   omeprazole (PRILOSEC) 20 MG capsule Take 1 capsule (20 mg total) by mouth 2 (two) times daily before a meal.   ONETOUCH DELICA LANCETS 33G MISC    tamsulosin (FLOMAX) 0.4 MG CAPS capsule Take 2 capsules (0.8 mg total) by mouth at bedtime.   traMADol (ULTRAM) 50 MG tablet Take 1 tablet (50 mg total) by mouth every 12 (twelve) hours as needed for moderate pain or severe pain.   vitamin B-12 (CYANOCOBALAMIN) 1000 MCG tablet Take 1,000 mcg by mouth daily. Chewable   [DISCONTINUED] dapagliflozin propanediol (FARXIGA) 10 MG TABS tablet Take 1 tablet (10 mg total) by mouth daily before breakfast.   [DISCONTINUED] glipiZIDE (GLUCOTROL) 5 MG tablet Take 1 tablet (5 mg total) by mouth 2 (two) times daily before a meal. TAKE 1 TABLET 2  TIMES A DAY BEFORE A MEAL.   No facility-administered encounter medications on file as of 07/28/2023.    ALLERGIES: Allergies  Allergen Reactions   Dilaudid [Hydromorphone Hcl] Other (See Comments)  PT SAID HE PASSED OUT WHEN HE TOOK IT    VACCINATION STATUS: Immunization History  Administered Date(s) Administered   Influenza, Seasonal, Injecte, Preservative Fre 06/19/2016   Influenza,inj,Quad PF,6+ Mos 06/17/2017, 06/20/2018, 07/12/2019, 07/19/2020, 08/19/2021, 08/20/2022    Diabetes He presents for his follow-up diabetic visit. He has type 2 diabetes mellitus. Onset time: Diagnosed at approx age of 29. His disease course has been worsening. There are no hypoglycemic associated symptoms. Pertinent negatives for diabetes include no fatigue, no foot paresthesias and no polydipsia. There are no hypoglycemic complications. Symptoms are stable. Diabetic complications include impotence, nephropathy and peripheral neuropathy. Risk factors for coronary artery disease include diabetes mellitus, dyslipidemia, family history, male sex and hypertension. Current diabetic treatment includes oral agent (triple therapy). He is compliant with treatment most of the time. His weight is fluctuating minimally. He is following a generally healthy diet. When asked about meal planning, he reported none. He has not had a previous visit with a dietitian. He participates in exercise intermittently. His home blood glucose trend is increasing steadily. His breakfast blood glucose range is generally 140-180 mg/dl. (He presents today with his meter and logs showing above target glycemic profile overall.  His POCT A1c today is 8.9%, increasing from last visit of 8.3%.  Analysis of his meter shows 14-day average of 178.  He denies any hypoglycemia.  He does admit to eating concentrated sweets and higher fat food items still.) An ACE inhibitor/angiotensin II receptor blocker is being taken. He does not see a podiatrist.Eye exam  is current.  Hyperlipidemia This is a chronic problem. The current episode started more than 1 year ago. The problem is uncontrolled. Recent lipid tests were reviewed and are variable. Exacerbating diseases include chronic renal disease, diabetes and liver disease. fatty liver. Factors aggravating his hyperlipidemia include fatty foods. Current antihyperlipidemic treatment includes statins and diet change. The current treatment provides mild improvement of lipids. Compliance problems include adherence to diet and adherence to exercise.  Risk factors for coronary artery disease include diabetes mellitus, dyslipidemia, family history, male sex, obesity and hypertension.  Hypertension This is a chronic problem. The current episode started more than 1 year ago. The problem has been resolved since onset. The problem is controlled. There are no associated agents to hypertension. Risk factors for coronary artery disease include dyslipidemia, diabetes mellitus, family history and male gender. Past treatments include angiotensin blockers. The current treatment provides moderate improvement. There are no compliance problems.  Hypertensive end-organ damage includes kidney disease. Identifiable causes of hypertension include chronic renal disease.   Review of systems  Constitutional: + stable body weight,  current Body mass index is 25.99 kg/m. , no fatigue, no subjective hyperthermia, no subjective hypothermia Eyes: no blurry vision, no xerophthalmia ENT: no sore throat, no nodules palpated in throat, no dysphagia/odynophagia, no hoarseness Cardiovascular: no chest pain, no shortness of breath, no palpitations, no leg swelling Respiratory: no cough, no shortness of breath Gastrointestinal: no nausea/vomiting/diarrhea Musculoskeletal: no muscle/joint aches Skin: + rash to right inner foot (not itchy)- present for last 2 weeks or so, no hyperemia Neurological: no tremors, no numbness, no tingling, no  dizziness Psychiatric: no depression, no anxiety   Objective:     BP 105/68   Pulse 81   Ht 6\' 1"  (1.854 m)   Wt 197 lb (89.4 kg)   BMI 25.99 kg/m   Wt Readings from Last 3 Encounters:  07/28/23 197 lb (89.4 kg)  04/27/23 197 lb 3.2 oz (89.4 kg)  02/18/23 201  lb (91.2 kg)     BP Readings from Last 3 Encounters:  07/28/23 105/68  04/27/23 102/70  02/18/23 107/71      Physical Exam- Limited  Constitutional:  Body mass index is 25.99 kg/m. , not in acute distress, normal state of mind Eyes:  EOMI, no exophthalmos Musculoskeletal: no gross deformities, strength intact in all four extremities, no gross restriction of joint movements Skin:  + raised rash to right medial foot, no hyperemia Neurological: no tremor with outstretched hands   Diabetic Foot Exam - Simple   Simple Foot Form Visual Inspection No deformities, no ulcerations, no other skin breakdown bilaterally: Yes Sensation Testing Intact to touch and monofilament testing bilaterally: Yes Pulse Check Posterior Tibialis and Dorsalis pulse intact bilaterally: Yes Comments    CMP ( most recent) CMP     Component Value Date/Time   NA 139 01/19/2023 0826   K 4.2 01/19/2023 0826   CL 102 01/19/2023 0826   CO2 21 01/19/2023 0826   GLUCOSE 206 (H) 01/19/2023 0826   GLUCOSE 161 (H) 12/12/2019 0302   BUN 15 01/19/2023 0826   CREATININE 1.05 01/19/2023 0826   CREATININE 0.78 04/18/2014 0719   CALCIUM 9.8 01/19/2023 0826   PROT 6.0 01/19/2023 0826   ALBUMIN 3.9 01/19/2023 0826   ALBUMIN 30mg /L 06/18/2022 1112   AST 24 01/19/2023 0826   ALT 34 01/19/2023 0826   ALKPHOS 78 01/19/2023 0826   BILITOT 1.3 (H) 01/19/2023 0826   GFRNONAA 88 06/11/2020 1204   GFRAA 101 06/11/2020 1204     Diabetic Labs (most recent): Lab Results  Component Value Date   HGBA1C 8.9 (A) 07/28/2023   HGBA1C 8.3 (A) 04/27/2023   HGBA1C 8.5 (A) 01/25/2023   MICROALBUR 10 07/28/2023     Lipid Panel ( most recent) Lipid Panel      Component Value Date/Time   CHOL 107 01/19/2023 0826   TRIG 155 (H) 01/19/2023 0826   HDL 32 (L) 01/19/2023 0826   CHOLHDL 3.3 01/19/2023 0826   CHOLHDL 5.2 04/18/2014 0719   VLDL 41 (H) 04/18/2014 0719   LDLCALC 48 01/19/2023 0826   LABVLDL 27 01/19/2023 0826      Lab Results  Component Value Date   TSH 3.390 01/19/2023   TSH 2.760 01/15/2022   TSH 3.940 12/23/2020   FREET4 1.33 01/19/2023   FREET4 1.20 01/15/2022           Assessment & Plan:   1) Type 2 diabetes mellitus without complication, without long-term current use of insulin (HCC)  He presents today with his meter and logs showing above target glycemic profile overall.  His POCT A1c today is 8.9%, increasing from last visit of 8.3%.  Analysis of his meter shows 14-day average of 178.  He denies any hypoglycemia.  He does admit to eating concentrated sweets and higher fat food items still.  - [name] [name] has currently uncontrolled symptomatic type 2 DM since 62 years of age.   -Recent labs reviewed.  - I had a long discussion with him about the progressive nature of diabetes and the pathology behind its complications. -his diabetes is complicated by neuropathy and mild CKD and he remains at a high risk for more acute and chronic complications which include CAD, CVA, CKD, retinopathy, and neuropathy. These are all discussed in detail with him.  - Nutritional counseling repeated at each appointment due to patients tendency to fall back in to old habits.  - The patient admits there is a  room for improvement in their diet and drink choices. -  Suggestion is made for the patient to avoid simple carbohydrates from their diet including Cakes, Sweet Desserts / Pastries, Ice Cream, Soda (diet and regular), Sweet Tea, Candies, Chips, Cookies, Sweet Pastries, Store Bought Juices, Alcohol in Excess of 1-2 drinks a day, Artificial Sweeteners, Coffee Creamer, and "Sugar-free" Products. This will help patient to have stable  blood glucose profile and potentially avoid unintended weight gain.   - I encouraged the patient to switch to unprocessed or minimally processed complex starch and increased protein intake (animal or plant source), fruits, and vegetables.   - Patient is advised to stick to a routine mealtimes to eat 3 meals a day and avoid unnecessary snacks (to snack only to correct hypoglycemia).  - he will be scheduled with Norm Salt, RDN, CDE for diabetes education.  - I have approached him with the following individualized plan to manage  his diabetes and patient agrees:   -He is advised to continue his Metformin 1000 mg po twice daily with meals Glipizide to 5 mg po twice daily, and Farxiga 10 mg po daily.  I discussed and initiated trial of Ozempic 0.25 mg SQ weekly x 2 weeks, then increase to 0.5 mg SQ weekly thereafter if tolerated well.  I did give him sample to get started and a coupon card to help with the copay.  We went over proper injection technique in the room today.  We also discussed potentially adding basal insulin on at next visit if we cannot regain/maintain control on oral antidiabetic medications.  -he is encouraged to continue monitoring blood glucose at least once daily, before breakfast and to call the clinic if he has readings less than 90 or greater than 200 for 3 tests in a row.  - Adjustment parameters are given to him for hypo and hyperglycemia in writing.  - Specific targets for  A1c;  LDL, HDL, and Triglycerides were discussed with the patient.  2) Blood Pressure /Hypertension:  his blood pressure is controlled to target.   he is advised to continue his current medications including Losartan 50 mg p.o. daily with breakfast.  3) Lipids/Hyperlipidemia:    Review of his recent lipid panel from 01/19/23 showed controlled  LDL at 48 and slightly elevated triglycerides of 155.  he is advised to continue Lipitor 20 mg daily at bedtime.  Side effects and precautions discussed  with him.  He is also encouraged to avoid fried foods and butter.    4)  Weight/Diet:  his Body mass index is 25.99 kg/m.  -   he is a candidate for some weight loss. I discussed with him the fact that loss of 5 - 10% of his  current body weight will have the most impact on his diabetes management.  Exercise, and detailed carbohydrates information provided  -  detailed on discharge instructions.  5) Chronic Care/Health Maintenance: -he is on ACEI/ARB and Statin medications and is encouraged to initiate and continue to follow up with Ophthalmology, Dentist, Podiatrist at least yearly or according to recommendations, and advised to stay away from smoking. I have recommended yearly flu vaccine and pneumonia vaccine at least every 5 years; moderate intensity exercise for up to 150 minutes weekly; and sleep for at least 7 hours a day.    - he is advised to maintain close follow up with Tommie Sams, DO for primary care needs, as well as his other providers for optimal and coordinated care.  I spent  40  minutes in the care of the patient today including review of labs from CMP, Lipids, Thyroid Function, Hematology (current and previous including abstractions from other facilities); face-to-face time discussing  his blood glucose readings/logs, discussing hypoglycemia and hyperglycemia episodes and symptoms, medications doses, his options of short and long term treatment based on the latest standards of care / guidelines;  discussion about incorporating lifestyle medicine;  and documenting the encounter. Risk reduction counseling performed per USPSTF guidelines to reduce obesity and cardiovascular risk factors.     Please refer to Patient Instructions for Blood Glucose Monitoring and Insulin/Medications Dosing Guide"  in media tab for additional information. Please  also refer to " Patient Self Inventory" in the Media  tab for reviewed elements of pertinent patient history.  [name] [name]  participated in the discussions, expressed understanding, and voiced agreement with the above plans.  All questions were answered to his satisfaction. he is encouraged to contact clinic should he have any questions or concerns prior to his return visit.   Follow up plan: - Return in about 3 months (around 10/28/2023) for Diabetes F/U with A1c in office, No previsit labs, Bring meter and logs.  Ronny Bacon, [facility] [place] [facility] [address] [place], [place] [address] Phone: [phone] Fax: [phone]  07/28/2023, 9:38 AM

## 2023-08-02 DIAGNOSIS — E119 Type 2 diabetes mellitus without complications: Secondary | ICD-10-CM | POA: Diagnosis not present

## 2023-08-02 LAB — HM DIABETES EYE EXAM

## 2023-08-05 ENCOUNTER — Other Ambulatory Visit: Payer: Self-pay | Admitting: Family Medicine

## 2023-08-23 ENCOUNTER — Ambulatory Visit: Payer: BC Managed Care – PPO | Admitting: Family Medicine

## 2023-08-23 ENCOUNTER — Encounter: Payer: Self-pay | Admitting: Family Medicine

## 2023-08-23 VITALS — BP 107/71 | HR 85 | Temp 97.5°F | Ht 73.0 in | Wt 195.0 lb

## 2023-08-23 DIAGNOSIS — Z23 Encounter for immunization: Secondary | ICD-10-CM | POA: Diagnosis not present

## 2023-08-23 DIAGNOSIS — E119 Type 2 diabetes mellitus without complications: Secondary | ICD-10-CM

## 2023-08-23 DIAGNOSIS — E1169 Type 2 diabetes mellitus with other specified complication: Secondary | ICD-10-CM

## 2023-08-23 DIAGNOSIS — I10 Essential (primary) hypertension: Secondary | ICD-10-CM | POA: Diagnosis not present

## 2023-08-23 DIAGNOSIS — E785 Hyperlipidemia, unspecified: Secondary | ICD-10-CM

## 2023-08-23 DIAGNOSIS — Z7984 Long term (current) use of oral hypoglycemic drugs: Secondary | ICD-10-CM

## 2023-08-23 MED ORDER — ATORVASTATIN CALCIUM 20 MG PO TABS: 20.0000 mg | ORAL_TABLET | Freq: Every day | ORAL | 3 refills | Status: DC

## 2023-08-23 NOTE — Assessment & Plan Note (Signed)
LDL at goal.  Continue statin.   

## 2023-08-23 NOTE — Patient Instructions (Signed)
Continue your meds.  Follow up in 6 months.  Take care  Dr. Shianne Zeiser  

## 2023-08-23 NOTE — Progress Notes (Signed)
Subjective:  [name], male    DOB: 08/24/1961  Age: 61 y.o. MRN: [id number]  CC:   Chief Complaint  Patient presents with   Hypertension    6 month follow up   Fatigue    After 3 to 4 pm sooner than before    HPI:  62 year old male with hypertension, GERD, type 2 diabetes, osteoarthritis, hyperlipidemia presents for follow-up.  Patient reports that overall he is doing fairly well.  He does note decrease in appetite secondary to Ozempic.  Last A1c 8.9.  He is followed by endocrinology.  He is on glipizide, Farxiga, metformin, and now Ozempic.  Patient desires his flu shot today.  Denies chest pain or shortness of breath.  Overall he is feeling well.  He does note that he feels tired in the evenings.  He also reports that his feet feel cold.  This has been an ongoing issue.  BP well-controlled on losartan.  LDL at goal on atorvastatin.  Patient Active Problem List   Diagnosis Date Noted   GERD (gastroesophageal reflux disease) 08/20/2022   Iron deficiency 08/20/2022   Insomnia 08/20/2022   Nocturia associated with benign prostatic hyperplasia 12/20/2020   Osteoarthritis of left knee 12/11/2019   Hyperlipidemia 06/20/2018   Erectile dysfunction 12/16/2016   Essential hypertension 06/19/2016   Type 2 diabetes mellitus (HCC) 03/20/2015    Social Hx   Social History   Socioeconomic History   Marital status: Married    Spouse name: Not on file   Number of children: Not on file   Years of education: Not on file   Highest education level: 12th grade  Occupational History   Not on file  Tobacco Use   Smoking status: Never    Passive exposure: Never   Smokeless tobacco: Never  Vaping Use   Vaping status: Never Used  Substance and Sexual Activity   Alcohol use: No   Drug use: No   Sexual activity: Never  Other Topics Concern   Not on file  Social History Narrative   Not on file   Social Determinants of Health   Financial Resource Strain: Low  Risk  (02/17/2023)   Overall Financial Resource Strain (CARDIA)    Difficulty of Paying Living Expenses: Not very hard  Food Insecurity: No Food Insecurity (02/17/2023)   Hunger Vital Sign    Worried About Running Out of Food in the Last Year: Never true    Ran Out of Food in the Last Year: Never true  Transportation Needs: No Transportation Needs (02/17/2023)   PRAPARE - Administrator, Civil Service (Medical): No    Lack of Transportation (Non-Medical): No  Physical Activity: Insufficiently Active (02/17/2023)   Exercise Vital Sign    Days of Exercise per Week: 3 days    Minutes of Exercise per Session: 30 min  Stress: No Stress Concern Present (02/17/2023)   [employer] of Occupational Health - Occupational Stress Questionnaire    Feeling of Stress : Not at all  Social Connections: Socially Integrated (02/17/2023)   Social Connection and Isolation Panel [NHANES]    Frequency of Communication with Friends and Family: More than three times a week    Frequency of Social Gatherings with Friends and Family: More than three times a week    Attends Religious Services: More than 4 times per year    Active Member of [employer] or Organizations: Yes    Attends Banker Meetings: More than 4 times  per year    Marital Status: Married    Review of Systems Per HPI  Objective:  BP 107/71   Pulse 85   Temp (!) 97.5 F (36.4 C)   Ht 6\' 1"  (1.854 m)   Wt 195 lb (88.5 kg)   SpO2 98%   BMI 25.73 kg/m      08/23/2023    8:33 AM 07/28/2023    8:20 AM 04/27/2023    8:19 AM  BP/Weight  Systolic BP 107 105 102  Diastolic BP 71 68 70  Wt. (Lbs) 195 197 197.2  BMI 25.73 kg/m2 25.99 kg/m2 26.02 kg/m2    Physical Exam Constitutional:      General: He is not in acute distress.    Appearance: Normal appearance.  HENT:     Head: Normocephalic and atraumatic.  Cardiovascular:     Rate and Rhythm: Normal rate and regular rhythm.  Pulmonary:     Effort: Pulmonary effort  is normal.     Breath sounds: Normal breath sounds. No wheezing, rhonchi or rales.  Feet:     Comments: Diabetic foot exam performed today.  See quality metrics.  Decreased sensation to monofilament testing.  1+ DP and PT pulses. Neurological:     Mental Status: He is alert.  Psychiatric:        Mood and Affect: Mood normal.        Behavior: Behavior normal.     Lab Results  Component Value Date   WBC 8.8 02/17/2022   HGB 13.1 02/17/2022   HCT 40.4 02/17/2022   PLT 188 02/17/2022   GLUCOSE 206 (H) 01/19/2023   CHOL 107 01/19/2023   TRIG 155 (H) 01/19/2023   HDL 32 (L) 01/19/2023   LDLCALC 48 01/19/2023   ALT 34 01/19/2023   AST 24 01/19/2023   NA 139 01/19/2023   K 4.2 01/19/2023   CL 102 01/19/2023   CREATININE 1.05 01/19/2023   BUN 15 01/19/2023   CO2 21 01/19/2023   TSH 3.390 01/19/2023   PSA 0.79 04/18/2014   INR 1.2 12/04/2019   HGBA1C 8.9 (A) 07/28/2023   MICROALBUR 10 07/28/2023     Assessment & Plan:   Problem List Items Addressed This Visit       Cardiovascular and Mediastinum   Essential hypertension - Primary    Stable.  Continue current medications.      Relevant Medications   atorvastatin (LIPITOR) 20 MG tablet     Endocrine   Type 2 diabetes mellitus (HCC)    Not at goal.  Continue close follow-up with endocrinology.      Relevant Medications   atorvastatin (LIPITOR) 20 MG tablet     Other   Hyperlipidemia    LDL at goal.  Continue statin.      Relevant Medications   atorvastatin (LIPITOR) 20 MG tablet   Other Visit Diagnoses     Immunization due       Relevant Orders   Flu vaccine trivalent PF, 6mos and older(Flulaval,Afluria,Fluarix,Fluzone) (Completed)       Meds ordered this encounter  Medications   atorvastatin (LIPITOR) 20 MG tablet    Sig: Take 1 tablet (20 mg total) by mouth at bedtime.    Dispense:  90 tablet    Refill:  3    NA    Follow-up:  Return in about 6 months (around 02/21/2024) for Follow up Chronic  medical issues.  Everlene Other DO [facility]

## 2023-08-23 NOTE — Assessment & Plan Note (Signed)
Stable.  Continue current medications.

## 2023-08-23 NOTE — Assessment & Plan Note (Signed)
Not at goal.  Continue close follow-up with endocrinology.

## 2023-09-08 ENCOUNTER — Other Ambulatory Visit: Payer: Self-pay | Admitting: Family Medicine

## 2023-09-08 DIAGNOSIS — G4700 Insomnia, unspecified: Secondary | ICD-10-CM

## 2023-10-28 ENCOUNTER — Encounter: Payer: Self-pay | Admitting: Nurse Practitioner

## 2023-10-28 ENCOUNTER — Ambulatory Visit: Payer: BC Managed Care – PPO | Admitting: Nurse Practitioner

## 2023-10-28 VITALS — BP 107/66 | HR 65 | Ht 73.0 in | Wt 194.8 lb

## 2023-10-28 DIAGNOSIS — I10 Essential (primary) hypertension: Secondary | ICD-10-CM

## 2023-10-28 DIAGNOSIS — E782 Mixed hyperlipidemia: Secondary | ICD-10-CM | POA: Diagnosis not present

## 2023-10-28 DIAGNOSIS — E119 Type 2 diabetes mellitus without complications: Secondary | ICD-10-CM | POA: Diagnosis not present

## 2023-10-28 DIAGNOSIS — Z7984 Long term (current) use of oral hypoglycemic drugs: Secondary | ICD-10-CM

## 2023-10-28 LAB — POCT GLYCOSYLATED HEMOGLOBIN (HGB A1C): Hemoglobin A1C: 7.6 % — AB (ref 4.0–5.6)

## 2023-10-28 NOTE — Progress Notes (Signed)
 Endocrinology Follow Up Note       10/28/2023, 8:29 AM   Subjective:    [name], male    DOB: 1961-03-21.  [name] [name] is being seen in follow up after being seen in consultation for management of currently uncontrolled symptomatic diabetes requested by  Cook, Jayce G, DO.   Past Medical History:  Diagnosis Date   Anxiety    Arthritis    Diabetes mellitus without complication (HCC)    Diabetes mellitus, type II (HCC)    Diverticulosis    pt unaware   ED (erectile dysfunction)    Fatty liver    GERD (gastroesophageal reflux disease)    History of COVID-19 07/2019   History of left inguinal hernia    History of retinal detachment    Right   Hyperlipidemia    Hypertension    Insomnia    Low ferritin level    Numbness and tingling of both feet    Shortness of breath dyspnea    since having COVID 07/2019 with exertions    Past Surgical History:  Procedure Laterality Date   BIOPSY  04/21/2022   Procedure: BIOPSY;  Surgeon: Cindie Carlin POUR, DO;  Location: [facility];  Service: Endoscopy;;   CHOLECYSTECTOMY     COLONOSCOPY N/A 11/30/2014   Procedure: COLONOSCOPY;  Surgeon: Lamar CHRISTELLA Hollingshead, MD;  Location: [facility];  Service: Endoscopy;  Laterality: N/A;  7:30 Am   COLONOSCOPY WITH PROPOFOL  N/A 04/21/2022   Procedure: COLONOSCOPY WITH PROPOFOL ;  Surgeon: Cindie Carlin POUR, DO;  Location: [facility];  Service: Endoscopy;  Laterality: N/A;  9:00am   ESOPHAGEAL DILATION     HERNIA REPAIR     1981   POLYPECTOMY  04/21/2022   Procedure: POLYPECTOMY;  Surgeon: Cindie Carlin POUR, DO;  Location: [facility];  Service: Endoscopy;;   RETINAL DETACHMENT SURGERY Right    TOTAL KNEE ARTHROPLASTY Left 12/11/2019   Procedure: TOTAL KNEE ARTHROPLASTY;  Surgeon: Melodi Lerner, MD;  Location: [facility];  Service: Orthopedics;  Laterality: Left;    VASCULAR SURGERY      Social History    Socioeconomic History   Marital status: Married    Spouse name: Not on file   Number of children: Not on file   Years of education: Not on file   Highest education level: 12th grade  Occupational History   Not on file  Tobacco Use   Smoking status: Never    Passive exposure: Never   Smokeless tobacco: Never  Vaping Use   Vaping status: Never Used  Substance and Sexual Activity   Alcohol use: No   Drug use: No   Sexual activity: Never  Other Topics Concern   Not on file  Social History Narrative   Not on file   Social Drivers of Health   Financial Resource Strain: Low Risk  (02/17/2023)   Overall Financial Resource Strain (CARDIA)    Difficulty of Paying Living Expenses: Not very hard  Food Insecurity: No Food Insecurity (02/17/2023)   Hunger Vital Sign    Worried About Running Out of Food in the Last Year: Never true    Ran  Out of Food in the Last Year: Never true  Transportation Needs: No Transportation Needs (02/17/2023)   PRAPARE - Administrator, Civil Service (Medical): No    Lack of Transportation (Non-Medical): No  Physical Activity: Insufficiently Active (02/17/2023)   Exercise Vital Sign    Days of Exercise per Week: 3 days    Minutes of Exercise per Session: 30 min  Stress: No Stress Concern Present (02/17/2023)   [employer] of Occupational Health - Occupational Stress Questionnaire    Feeling of Stress : Not at all  Social Connections: Socially Integrated (02/17/2023)   Social Connection and Isolation Panel [NHANES]    Frequency of Communication with Friends and Family: More than three times a week    Frequency of Social Gatherings with Friends and Family: More than three times a week    Attends Religious Services: More than 4 times per year    Active Member of [employer] or Organizations: Yes    Attends Engineer, Structural: More than 4 times per year    Marital Status: Married    Family History  Problem Relation Age of Onset    Diabetes Mother    Neuropathy Mother     Outpatient Encounter Medications as of 10/28/2023  Medication Sig   acetaminophen  (TYLENOL ) 500 MG tablet Take 1,000 mg by mouth every morning.   atorvastatin  (LIPITOR) 20 MG tablet Take 1 tablet (20 mg total) by mouth at bedtime.   CONTOUR NEXT TEST test strip USE TO TEST BLOOD SUGAR ONCE DAILY.   dutasteride  (AVODART ) 0.5 MG capsule Take 1 capsule (0.5 mg total) by mouth daily.   fluticasone  (FLONASE ) 50 MCG/ACT nasal spray USE 2 SPRAYS IN EACH NOSTRIL EVERY DAY, AS NEEDED.   glipiZIDE  (GLUCOTROL ) 5 MG tablet Take 1 tablet (5 mg total) by mouth 2 (two) times daily before a meal. TAKE 1 TABLET 2 TIMES A DAY BEFORE A MEAL.   LORazepam  (ATIVAN ) 1 MG tablet take 1/2-1 tablet by mouth at bedtime as needed for anxiety/insomnia.   losartan  (COZAAR ) 50 MG tablet Take 1 tablet (50 mg total) by mouth daily.   metFORMIN  (GLUCOPHAGE ) 500 MG tablet Take 2 tablets (1,000 mg total) by mouth 2 (two) times daily with a meal.   omeprazole  (PRILOSEC) 20 MG capsule Take 1 capsule (20 mg total) by mouth 2 (two) times daily before a meal.   ONETOUCH DELICA LANCETS 33G MISC    Semaglutide ,0.25 or 0.5MG /DOS, (OZEMPIC , 0.25 OR 0.5 MG/DOSE,) 2 MG/3ML SOPN Inject 0.5 mg into the skin once a week.   tamsulosin  (FLOMAX ) 0.4 MG CAPS capsule Take 2 capsules (0.8 mg total) by mouth at bedtime.   traMADol  (ULTRAM ) 50 MG tablet Take 1 tablet (50 mg total) by mouth every 12 (twelve) hours as needed for moderate pain (pain score 4-6) or severe pain (pain score 7-10).   vitamin B-12 (CYANOCOBALAMIN ) 1000 MCG tablet Take 1,000 mcg by mouth daily. Chewable   [DISCONTINUED] dapagliflozin  propanediol (FARXIGA ) 10 MG TABS tablet Take 1 tablet (10 mg total) by mouth daily before breakfast. (Patient not taking: Reported on 10/28/2023)   No facility-administered encounter medications on file as of 10/28/2023.    ALLERGIES: Allergies  Allergen Reactions   Dilaudid  [Hydromorphone  Hcl] Other (See  Comments)    PT SAID HE PASSED OUT WHEN HE TOOK IT    VACCINATION STATUS: Immunization History  Administered Date(s) Administered   Influenza, Seasonal, Injecte, Preservative Fre 06/19/2016, 08/23/2023   Influenza,inj,Quad PF,6+ Mos 06/17/2017, 06/20/2018, 07/12/2019, 07/19/2020,  08/19/2021, 08/20/2022    Diabetes He presents for his follow-up diabetic visit. He has type 2 diabetes mellitus. Onset time: Diagnosed at approx age of 51. His disease course has been improving. There are no hypoglycemic associated symptoms. Associated symptoms include weight loss. Pertinent negatives for diabetes include no fatigue, no foot paresthesias and no polydipsia. There are no hypoglycemic complications. Symptoms are stable. Diabetic complications include impotence, nephropathy and peripheral neuropathy. Risk factors for coronary artery disease include diabetes mellitus, dyslipidemia, family history, male sex and hypertension. Current diabetic treatment includes oral agent (triple therapy). He is compliant with treatment most of the time. His weight is fluctuating minimally. He is following a generally healthy diet. When asked about meal planning, he reported none. He has not had a previous visit with a dietitian. He participates in exercise intermittently. His home blood glucose trend is fluctuating minimally. His breakfast blood glucose range is generally 130-140 mg/dl. (He presents today with his meter and logs showing mostly at target fasting glycemic profile.  His POCT A1c today is 7.6%, improving from last visit of 8.9%.  He has tolerated the addition of Ozempic  well, did have mild GI symptoms but seem to be resolving with time.  Analysis of his meter shows 14-day average of 152.  He ran out of Farxiga  about a week or 2 ago and did not refill due to cost.) An ACE inhibitor/angiotensin II receptor blocker is being taken. He does not see a podiatrist.Eye exam is current.  Hyperlipidemia This is a chronic problem.  The current episode started more than 1 year ago. The problem is uncontrolled. Recent lipid tests were reviewed and are variable. Exacerbating diseases include chronic renal disease, diabetes and liver disease. fatty liver. Factors aggravating his hyperlipidemia include fatty foods. Current antihyperlipidemic treatment includes statins and diet change. The current treatment provides mild improvement of lipids. Compliance problems include adherence to diet and adherence to exercise.  Risk factors for coronary artery disease include diabetes mellitus, dyslipidemia, family history, male sex, obesity and hypertension.  Hypertension This is a chronic problem. The current episode started more than 1 year ago. The problem has been resolved since onset. The problem is controlled. There are no associated agents to hypertension. Risk factors for coronary artery disease include dyslipidemia, diabetes mellitus, family history and male gender. Past treatments include angiotensin blockers. The current treatment provides moderate improvement. There are no compliance problems.  Hypertensive end-organ damage includes kidney disease. Identifiable causes of hypertension include chronic renal disease.    Review of systems  Constitutional: + Minimally fluctuating body weight,  current Body mass index is 25.7 kg/m. , no fatigue, no subjective hyperthermia, no subjective hypothermia Eyes: no blurry vision, no xerophthalmia ENT: no sore throat, no nodules palpated in throat, no dysphagia/odynophagia, no hoarseness Cardiovascular: no chest pain, no shortness of breath, no palpitations, no leg swelling Respiratory: no cough, no shortness of breath Gastrointestinal: no nausea/vomiting/diarrhea Musculoskeletal: no muscle/joint aches Skin: no rashes, no hyperemia Neurological: no tremors, no numbness, no tingling, no dizziness Psychiatric: no depression, no anxiety   Objective:     BP 107/66 (BP Location: Left Arm,  Patient Position: Sitting, Cuff Size: Large)   Pulse 65   Ht 6' 1 (1.854 m)   Wt 194 lb 12.8 oz (88.4 kg)   BMI 25.70 kg/m   Wt Readings from Last 3 Encounters:  10/28/23 194 lb 12.8 oz (88.4 kg)  08/23/23 195 lb (88.5 kg)  07/28/23 197 lb (89.4 kg)     BP Readings  from Last 3 Encounters:  10/28/23 107/66  08/23/23 107/71  07/28/23 105/68      Physical Exam- Limited  Constitutional:  Body mass index is 25.7 kg/m. , not in acute distress, normal state of mind Eyes:  EOMI, no exophthalmos Musculoskeletal: no gross deformities, strength intact in all four extremities, no gross restriction of joint movements Skin:  no rashes, no hyperemia Neurological: no tremor with outstretched hands   Diabetic Foot Exam - Simple   No data filed    CMP ( most recent) CMP     Component Value Date/Time   NA 139 01/19/2023 0826   K 4.2 01/19/2023 0826   CL 102 01/19/2023 0826   CO2 21 01/19/2023 0826   GLUCOSE 206 (H) 01/19/2023 0826   GLUCOSE 161 (H) 12/12/2019 0302   BUN 15 01/19/2023 0826   CREATININE 1.05 01/19/2023 0826   CREATININE 0.78 04/18/2014 0719   CALCIUM  9.8 01/19/2023 0826   PROT 6.0 01/19/2023 0826   ALBUMIN 3.9 01/19/2023 0826   ALBUMIN 30mg /L 06/18/2022 1112   AST 24 01/19/2023 0826   ALT 34 01/19/2023 0826   ALKPHOS 78 01/19/2023 0826   BILITOT 1.3 (H) 01/19/2023 0826   GFRNONAA 88 06/11/2020 1204   GFRAA 101 06/11/2020 1204     Diabetic Labs (most recent): Lab Results  Component Value Date   HGBA1C 7.6 (A) 10/28/2023   HGBA1C 8.9 (A) 07/28/2023   HGBA1C 8.3 (A) 04/27/2023   MICROALBUR 10 07/28/2023     Lipid Panel ( most recent) Lipid Panel     Component Value Date/Time   CHOL 107 01/19/2023 0826   TRIG 155 (H) 01/19/2023 0826   HDL 32 (L) 01/19/2023 0826   CHOLHDL 3.3 01/19/2023 0826   CHOLHDL 5.2 04/18/2014 0719   VLDL 41 (H) 04/18/2014 0719   LDLCALC 48 01/19/2023 0826   LABVLDL 27 01/19/2023 0826      Lab Results  Component Value  Date   TSH 3.390 01/19/2023   TSH 2.760 01/15/2022   TSH 3.940 12/23/2020   FREET4 1.33 01/19/2023   FREET4 1.20 01/15/2022           Assessment & Plan:   1) Type 2 diabetes mellitus without complication, without long-term current use of insulin  (HCC)  He presents today with his meter and logs showing mostly at target fasting glycemic profile.  His POCT A1c today is 7.6%, improving from last visit of 8.9%.  He has tolerated the addition of Ozempic  well, did have mild GI symptoms but seem to be resolving with time.  Analysis of his meter shows 14-day average of 152.  He ran out of Farxiga  about a week or 2 ago and did not refill due to cost.  - [name] [name] has currently uncontrolled symptomatic type 2 DM since 63 years of age.   -Recent labs reviewed.  - I had a long discussion with him about the progressive nature of diabetes and the pathology behind its complications. -his diabetes is complicated by neuropathy and mild CKD and he remains at a high risk for more acute and chronic complications which include CAD, CVA, CKD, retinopathy, and neuropathy. These are all discussed in detail with him.  - Nutritional counseling repeated at each appointment due to patients tendency to fall back in to old habits.  - The patient admits there is a room for improvement in their diet and drink choices. -  Suggestion is made for the patient to avoid simple carbohydrates from their diet including  Cakes, Sweet Desserts / Pastries, Ice Cream, Soda (diet and regular), Sweet Tea, Candies, Chips, Cookies, Sweet Pastries, Store Bought Juices, Alcohol in Excess of 1-2 drinks a day, Artificial Sweeteners, Coffee Creamer, and Sugar-free Products. This will help patient to have stable blood glucose profile and potentially avoid unintended weight gain.   - I encouraged the patient to switch to unprocessed or minimally processed complex starch and increased protein intake (animal or plant source), fruits, and  vegetables.   - Patient is advised to stick to a routine mealtimes to eat 3 meals a day and avoid unnecessary snacks (to snack only to correct hypoglycemia).  - he will be scheduled with Penny Crumpton, RDN, CDE for diabetes education.  - I have approached him with the following individualized plan to manage  his diabetes and patient agrees:   -He is advised to continue his Metformin  1000 mg po twice daily with meals Glipizide  5 mg po twice daily, and Ozempic  0.5 mg SQ weekly.  He can stay off the Farxiga  for now due to cost.  We also discussed potentially adding basal insulin  on at next visit if we cannot regain/maintain control on oral antidiabetic medications.  -he is encouraged to continue monitoring blood glucose at least once daily, before breakfast and to call the clinic if he has readings less than 90 or greater than 200 for 3 tests in a row.  - Adjustment parameters are given to him for hypo and hyperglycemia in writing.  - Specific targets for  A1c;  LDL, HDL, and Triglycerides were discussed with the patient.  2) Blood Pressure /Hypertension:  his blood pressure is controlled to target.   he is advised to continue his current medications including Losartan  50 mg p.o. daily with breakfast.  3) Lipids/Hyperlipidemia:    Review of his recent lipid panel from 01/19/23 showed controlled  LDL at 48 and slightly elevated triglycerides of 155.  he is advised to continue Lipitor 20 mg daily at bedtime.  Side effects and precautions discussed with him.  He is also encouraged to avoid fried foods and butter.  Will recheck lipid panel prior to next visit.  4)  Weight/Diet:  his Body mass index is 25.7 kg/m.  -   he is a candidate for some weight loss. I discussed with him the fact that loss of 5 - 10% of his  current body weight will have the most impact on his diabetes management.  Exercise, and detailed carbohydrates information provided  -  detailed on discharge instructions.  5) Chronic  Care/Health Maintenance: -he is on ACEI/ARB and Statin medications and is encouraged to initiate and continue to follow up with Ophthalmology, Dentist, Podiatrist at least yearly or according to recommendations, and advised to stay away from smoking. I have recommended yearly flu vaccine and pneumonia vaccine at least every 5 years; moderate intensity exercise for up to 150 minutes weekly; and sleep for at least 7 hours a day.    - he is advised to maintain close follow up with Cook, Jayce G, DO for primary care needs, as well as his other providers for optimal and coordinated care.     I spent  33  minutes in the care of the patient today including review of labs from CMP, Lipids, Thyroid  Function, Hematology (current and previous including abstractions from other facilities); face-to-face time discussing  his blood glucose readings/logs, discussing hypoglycemia and hyperglycemia episodes and symptoms, medications doses, his options of short and long term treatment based on the  latest standards of care / guidelines;  discussion about incorporating lifestyle medicine;  and documenting the encounter. Risk reduction counseling performed per USPSTF guidelines to reduce obesity and cardiovascular risk factors.     Please refer to Patient Instructions for Blood Glucose Monitoring and Insulin /Medications Dosing Guide  in media tab for additional information. Please  also refer to  Patient Self Inventory in the Media  tab for reviewed elements of pertinent patient history.  [name] [name] participated in the discussions, expressed understanding, and voiced agreement with the above plans.  All questions were answered to his satisfaction. he is encouraged to contact clinic should he have any questions or concerns prior to his return visit.   Follow up plan: - Return in about 3 months (around 01/25/2024) for Diabetes F/U with A1c in office, Previsit labs, Bring meter and logs.  Benton Rio,  [facility] [address] [place], [place] [address] Phone: [phone] Fax: [phone]  10/28/2023, 8:29 AM

## 2023-11-03 ENCOUNTER — Other Ambulatory Visit: Payer: Self-pay | Admitting: Family Medicine

## 2023-11-09 ENCOUNTER — Telehealth: Payer: Self-pay | Admitting: *Deleted

## 2023-11-09 NOTE — Telephone Encounter (Signed)
 Patient was identified as falling into the [place] Measure - Diabetes.   Patient was: Appointment scheduled for lab or office visit for A1c.  Patient was seen by Endocrinology 10/28/2023 an HgbA1c was down to visit and number has come down to 7.6

## 2023-11-11 ENCOUNTER — Telehealth: Payer: Self-pay | Admitting: Nurse Practitioner

## 2023-11-11 NOTE — Telephone Encounter (Signed)
 Let me check with the drug rep first.

## 2023-11-11 NOTE — Telephone Encounter (Signed)
 Pt's wife states that the Ozempic is over $400. She went online and did uit for him and took it to [facility]. The Pharmacy told her they do not accept these anymore as they were hacked. She is asking what medicine do you want him to do as far as his medicine

## 2023-12-09 ENCOUNTER — Other Ambulatory Visit: Payer: Self-pay | Admitting: Family Medicine

## 2023-12-09 DIAGNOSIS — G4700 Insomnia, unspecified: Secondary | ICD-10-CM

## 2024-01-03 ENCOUNTER — Other Ambulatory Visit: Payer: Self-pay | Admitting: Family Medicine

## 2024-01-14 DIAGNOSIS — E119 Type 2 diabetes mellitus without complications: Secondary | ICD-10-CM | POA: Diagnosis not present

## 2024-01-15 LAB — COMPREHENSIVE METABOLIC PANEL WITH GFR
ALT: 22 IU/L (ref 0–44)
AST: 19 IU/L (ref 0–40)
Albumin: 3.7 g/dL — ABNORMAL LOW (ref 3.9–4.9)
Alkaline Phosphatase: 79 IU/L (ref 44–121)
BUN/Creatinine Ratio: 16 (ref 10–24)
BUN: 16 mg/dL (ref 8–27)
Bilirubin Total: 1 mg/dL (ref 0.0–1.2)
CO2: 19 mmol/L — ABNORMAL LOW (ref 20–29)
Calcium: 10 mg/dL (ref 8.6–10.2)
Chloride: 103 mmol/L (ref 96–106)
Creatinine, Ser: 1.02 mg/dL (ref 0.76–1.27)
Globulin, Total: 2.6 g/dL (ref 1.5–4.5)
Glucose: 164 mg/dL — ABNORMAL HIGH (ref 70–99)
Potassium: 4.2 mmol/L (ref 3.5–5.2)
Sodium: 138 mmol/L (ref 134–144)
Total Protein: 6.3 g/dL (ref 6.0–8.5)
eGFR: 82 mL/min/{1.73_m2} (ref >59)

## 2024-01-15 LAB — LIPID PANEL
Chol/HDL Ratio: 2.7 ratio (ref 0.0–5.0)
Cholesterol, Total: 105 mg/dL (ref 100–199)
HDL: 39 mg/dL — ABNORMAL LOW (ref >39)
LDL Chol Calc (NIH): 49 mg/dL (ref 0–99)
Triglycerides: 87 mg/dL (ref 0–149)
VLDL Cholesterol Cal: 17 mg/dL (ref 5–40)

## 2024-01-15 LAB — TSH: TSH: 3.17 u[IU]/mL (ref 0.450–4.500)

## 2024-01-15 LAB — T4, FREE: Free T4: 1.18 ng/dL (ref 0.82–1.77)

## 2024-01-25 ENCOUNTER — Encounter: Payer: Self-pay | Admitting: Nurse Practitioner

## 2024-01-25 ENCOUNTER — Ambulatory Visit: Payer: BC Managed Care – PPO | Admitting: Nurse Practitioner

## 2024-01-25 VITALS — BP 110/70 | HR 64 | Ht 73.0 in | Wt 191.0 lb

## 2024-01-25 DIAGNOSIS — E782 Mixed hyperlipidemia: Secondary | ICD-10-CM | POA: Diagnosis not present

## 2024-01-25 DIAGNOSIS — E119 Type 2 diabetes mellitus without complications: Secondary | ICD-10-CM

## 2024-01-25 DIAGNOSIS — I10 Essential (primary) hypertension: Secondary | ICD-10-CM

## 2024-01-25 DIAGNOSIS — Z7984 Long term (current) use of oral hypoglycemic drugs: Secondary | ICD-10-CM | POA: Diagnosis not present

## 2024-01-25 LAB — POCT GLYCOSYLATED HEMOGLOBIN (HGB A1C): Hemoglobin A1C: 7.3 % — AB (ref 4.0–5.6)

## 2024-01-25 MED ORDER — METFORMIN HCL 500 MG PO TABS: 1000.0000 mg | ORAL_TABLET | Freq: Two times a day (BID) | ORAL | 3 refills | Status: DC

## 2024-01-25 MED ORDER — SEMAGLUTIDE (1 MG/DOSE) 4 MG/3ML ~~LOC~~ SOPN: 1.0000 mg | PEN_INJECTOR | SUBCUTANEOUS | 3 refills | Status: DC

## 2024-01-25 NOTE — Progress Notes (Signed)
 Endocrinology Follow Up Note       01/25/2024, 8:44 AM   Subjective:    [name], male    DOB: Jul 24, 1961.  [name] [name] is being seen in follow up after being seen in consultation for management of currently uncontrolled symptomatic diabetes requested by  Cook, Jayce G, DO.   Past Medical History:  Diagnosis Date   Anxiety    Arthritis    Diabetes mellitus without complication (HCC)    Diabetes mellitus, type II (HCC)    Diverticulosis    pt unaware   ED (erectile dysfunction)    Fatty liver    GERD (gastroesophageal reflux disease)    History of COVID-19 07/2019   History of left inguinal hernia    History of retinal detachment    Right   Hyperlipidemia    Hypertension    Insomnia    Low ferritin level    Numbness and tingling of both feet    Shortness of breath dyspnea    since having COVID 07/2019 with exertions    Past Surgical History:  Procedure Laterality Date   BIOPSY  04/21/2022   Procedure: BIOPSY;  Surgeon: Vinetta Greening, DO;  Location: [facility];  Service: Endoscopy;;   CHOLECYSTECTOMY     COLONOSCOPY N/A 11/30/2014   Procedure: COLONOSCOPY;  Surgeon: Suzette Espy, MD;  Location: [facility];  Service: Endoscopy;  Laterality: N/A;  7:30 Am   COLONOSCOPY WITH PROPOFOL  N/A 04/21/2022   Procedure: COLONOSCOPY WITH PROPOFOL ;  Surgeon: Vinetta Greening, DO;  Location: [facility];  Service: Endoscopy;  Laterality: N/A;  9:00am   ESOPHAGEAL DILATION     HERNIA REPAIR     1981   POLYPECTOMY  04/21/2022   Procedure: POLYPECTOMY;  Surgeon: Vinetta Greening, DO;  Location: [facility];  Service: Endoscopy;;   RETINAL DETACHMENT SURGERY Right    TOTAL KNEE ARTHROPLASTY Left 12/11/2019   Procedure: TOTAL KNEE ARTHROPLASTY;  Surgeon: Liliane Rei, MD;  Location: [facility];  Service: Orthopedics;  Laterality: Left;    VASCULAR SURGERY      Social History    Socioeconomic History   Marital status: Married    Spouse name: Not on file   Number of children: Not on file   Years of education: Not on file   Highest education level: 12th grade  Occupational History   Not on file  Tobacco Use   Smoking status: Never    Passive exposure: Never   Smokeless tobacco: Never  Vaping Use   Vaping status: Never Used  Substance and Sexual Activity   Alcohol use: No   Drug use: No   Sexual activity: Never  Other Topics Concern   Not on file  Social History Narrative   Not on file   Social Drivers of Health   Financial Resource Strain: Low Risk  (02/17/2023)   Overall Financial Resource Strain (CARDIA)    Difficulty of Paying Living Expenses: Not very hard  Food Insecurity: No Food Insecurity (02/17/2023)   Hunger Vital Sign    Worried About Running Out of Food in the Last Year: Never true    Ran  Out of Food in the Last Year: Never true  Transportation Needs: No Transportation Needs (02/17/2023)   PRAPARE - Administrator, Civil Service (Medical): No    Lack of Transportation (Non-Medical): No  Physical Activity: Insufficiently Active (02/17/2023)   Exercise Vital Sign    Days of Exercise per Week: 3 days    Minutes of Exercise per Session: 30 min  Stress: No Stress Concern Present (02/17/2023)   [employer] of Occupational Health - Occupational Stress Questionnaire    Feeling of Stress : Not at all  Social Connections: Socially Integrated (02/17/2023)   Social Connection and Isolation Panel [NHANES]    Frequency of Communication with Friends and Family: More than three times a week    Frequency of Social Gatherings with Friends and Family: More than three times a week    Attends Religious Services: More than 4 times per year    Active Member of [employer] or Organizations: Yes    Attends Engineer, structural: More than 4 times per year    Marital Status: Married    Family History  Problem Relation Age of Onset    Diabetes Mother    Neuropathy Mother     Outpatient Encounter Medications as of 01/25/2024  Medication Sig   acetaminophen  (TYLENOL ) 500 MG tablet Take 1,000 mg by mouth every morning.   atorvastatin  (LIPITOR) 20 MG tablet Take 1 tablet (20 mg total) by mouth at bedtime.   CONTOUR NEXT TEST test strip USE TO TEST BLOOD SUGAR ONCE DAILY.   dutasteride  (AVODART ) 0.5 MG capsule Take 1 capsule (0.5 mg total) by mouth daily.   fluticasone  (FLONASE ) 50 MCG/ACT nasal spray use 2 sprays in each nostril every day, as needed.   LORazepam  (ATIVAN ) 1 MG tablet take 1/2-1 tablet by mouth at bedtime as needed for anxiety/insomnia.   losartan  (COZAAR ) 50 MG tablet Take 1 tablet (50 mg total) by mouth daily.   omeprazole  (PRILOSEC) 20 MG capsule Take 1 capsule (20 mg total) by mouth 2 (two) times daily before a meal.   ONETOUCH DELICA LANCETS 33G MISC    Semaglutide , 1 MG/DOSE, 4 MG/3ML SOPN Inject 1 mg as directed once a week.   tamsulosin  (FLOMAX ) 0.4 MG CAPS capsule Take 2 capsules (0.8 mg total) by mouth at bedtime.   traMADol  (ULTRAM ) 50 MG tablet Take 1 tablet (50 mg total) by mouth every 12 (twelve) hours as needed for moderate pain (pain score 4-6) or severe pain (pain score 7-10).   vitamin B-12 (CYANOCOBALAMIN ) 1000 MCG tablet Take 1,000 mcg by mouth daily. Chewable   [DISCONTINUED] glipiZIDE  (GLUCOTROL ) 5 MG tablet Take 1 tablet (5 mg total) by mouth 2 (two) times daily before a meal. TAKE 1 TABLET 2 TIMES A DAY BEFORE A MEAL.   [DISCONTINUED] metFORMIN  (GLUCOPHAGE ) 500 MG tablet Take 2 tablets (1,000 mg total) by mouth 2 (two) times daily with a meal.   [DISCONTINUED] Semaglutide ,0.25 or 0.5MG /DOS, (OZEMPIC , 0.25 OR 0.5 MG/DOSE,) 2 MG/3ML SOPN Inject 0.5 mg into the skin once a week.   metFORMIN  (GLUCOPHAGE ) 500 MG tablet Take 2 tablets (1,000 mg total) by mouth 2 (two) times daily with a meal.   [DISCONTINUED] FEROSUL 325 (65 Fe) MG tablet TAKE 1 TABLET BY MOUTH EVERY OTHER DAY   No  facility-administered encounter medications on file as of 01/25/2024.    ALLERGIES: Allergies  Allergen Reactions   Dilaudid  [Hydromorphone  Hcl] Other (See Comments)    PT SAID HE PASSED OUT WHEN HE  TOOK IT    VACCINATION STATUS: Immunization History  Administered Date(s) Administered   Influenza, Seasonal, Injecte, Preservative Fre 06/19/2016, 08/23/2023   Influenza,inj,Quad PF,6+ Mos 06/17/2017, 06/20/2018, 07/12/2019, 07/19/2020, 08/19/2021, 08/20/2022    Diabetes He presents for his follow-up diabetic visit. He has type 2 diabetes mellitus. Onset time: Diagnosed at approx age of 51. His disease course has been improving. There are no hypoglycemic associated symptoms. Associated symptoms include weight loss. Pertinent negatives for diabetes include no fatigue, no foot paresthesias and no polydipsia. There are no hypoglycemic complications. Symptoms are stable. Diabetic complications include impotence, nephropathy and peripheral neuropathy. Risk factors for coronary artery disease include diabetes mellitus, dyslipidemia, family history, male sex and hypertension. Current diabetic treatment includes oral agent (dual therapy). He is compliant with treatment most of the time. His weight is fluctuating minimally. He is following a generally healthy diet. When asked about meal planning, he reported none. He has not had a previous visit with a dietitian. He participates in exercise intermittently. His home blood glucose trend is decreasing steadily. His breakfast blood glucose range is generally 130-140 mg/dl. (He presents today with his meter and logs showing mostly at target fasting glycemic profile.  His POCT A1c today is 7.3%, improving from last visit of 7.6%.  He has tolerated the addition of Ozempic  well, did have mild GI symptoms but seem to be resolving with time, says it also depends on what he is eating.  Analysis of his meter shows 14-day average of 140.  ) An ACE inhibitor/angiotensin II  receptor blocker is being taken. He does not see a podiatrist.Eye exam is current.  Hyperlipidemia This is a chronic problem. The current episode started more than 1 year ago. The problem is uncontrolled. Recent lipid tests were reviewed and are variable. Exacerbating diseases include chronic renal disease, diabetes and liver disease. fatty liver. Factors aggravating his hyperlipidemia include fatty foods. Current antihyperlipidemic treatment includes statins and diet change. The current treatment provides mild improvement of lipids. Compliance problems include adherence to diet and adherence to exercise.  Risk factors for coronary artery disease include diabetes mellitus, dyslipidemia, family history, male sex, obesity and hypertension.  Hypertension This is a chronic problem. The current episode started more than 1 year ago. The problem has been resolved since onset. The problem is controlled. There are no associated agents to hypertension. Risk factors for coronary artery disease include dyslipidemia, diabetes mellitus, family history and male gender. Past treatments include angiotensin blockers. The current treatment provides moderate improvement. There are no compliance problems.  Hypertensive end-organ damage includes kidney disease. Identifiable causes of hypertension include chronic renal disease.    Review of systems  Constitutional: + decreasing body weight,  current Body mass index is 25.2 kg/m. , no fatigue, no subjective hyperthermia, no subjective hypothermia Eyes: no blurry vision, no xerophthalmia ENT: no sore throat, no nodules palpated in throat, no dysphagia/odynophagia, no hoarseness Cardiovascular: no chest pain, no shortness of breath, no palpitations, no leg swelling Respiratory: no cough, no shortness of breath Gastrointestinal: no nausea/vomiting, + intermittent diarrhea- when eats something he shouldn't on the Ozempic  Musculoskeletal: no muscle/joint aches Skin: no rashes,  no hyperemia Neurological: no tremors, no numbness, no tingling, no dizziness Psychiatric: no depression, no anxiety   Objective:     BP 110/70 (BP Location: Left Arm, Patient Position: Sitting, Cuff Size: Large)   Pulse 64   Ht 6\' 1"  (1.854 m)   Wt 191 lb (86.6 kg)   BMI 25.20 kg/m   Wt  Readings from Last 3 Encounters:  01/25/24 191 lb (86.6 kg)  10/28/23 194 lb 12.8 oz (88.4 kg)  08/23/23 195 lb (88.5 kg)     BP Readings from Last 3 Encounters:  01/25/24 110/70  10/28/23 107/66  08/23/23 107/71      Physical Exam- Limited  Constitutional:  Body mass index is 25.2 kg/m. , not in acute distress, normal state of mind Eyes:  EOMI, no exophthalmos Musculoskeletal: no gross deformities, strength intact in all four extremities, no gross restriction of joint movements Skin:  no rashes, no hyperemia Neurological: no tremor with outstretched hands   Diabetic Foot Exam - Simple   No data filed    CMP ( most recent) CMP     Component Value Date/Time   NA 138 01/14/2024 0852   K 4.2 01/14/2024 0852   CL 103 01/14/2024 0852   CO2 19 (L) 01/14/2024 0852   GLUCOSE 164 (H) 01/14/2024 0852   GLUCOSE 161 (H) 12/12/2019 0302   BUN 16 01/14/2024 0852   CREATININE 1.02 01/14/2024 0852   CREATININE 0.78 04/18/2014 0719   CALCIUM  10.0 01/14/2024 0852   PROT 6.3 01/14/2024 0852   ALBUMIN 3.7 (L) 01/14/2024 0852   ALBUMIN 30mg /L 06/18/2022 1112   AST 19 01/14/2024 0852   ALT 22 01/14/2024 0852   ALKPHOS 79 01/14/2024 0852   BILITOT 1.0 01/14/2024 0852   GFRNONAA 88 06/11/2020 1204   GFRAA 101 06/11/2020 1204     Diabetic Labs (most recent): Lab Results  Component Value Date   HGBA1C 7.3 (A) 01/25/2024   HGBA1C 7.6 (A) 10/28/2023   HGBA1C 8.9 (A) 07/28/2023   MICROALBUR 10 07/28/2023     Lipid Panel ( most recent) Lipid Panel     Component Value Date/Time   CHOL 105 01/14/2024 0852   TRIG 87 01/14/2024 0852   HDL 39 (L) 01/14/2024 0852   CHOLHDL 2.7  01/14/2024 0852   CHOLHDL 5.2 04/18/2014 0719   VLDL 41 (H) 04/18/2014 0719   LDLCALC 49 01/14/2024 0852   LABVLDL 17 01/14/2024 0852      Lab Results  Component Value Date   TSH 3.170 01/14/2024   TSH 3.390 01/19/2023   TSH 2.760 01/15/2022   TSH 3.940 12/23/2020   FREET4 1.18 01/14/2024   FREET4 1.33 01/19/2023   FREET4 1.20 01/15/2022           Assessment & Plan:   1) Type 2 diabetes mellitus without complication, without long-term current use of insulin  (HCC)  He presents today with his meter and logs showing mostly at target fasting glycemic profile.  His POCT A1c today is 7.3%, improving from last visit of 7.6%.  He has tolerated the addition of Ozempic  well, did have mild GI symptoms but seem to be resolving with time, says it also depends on what he is eating.  Analysis of his meter shows 14-day average of 140.    - [name] [name] has currently uncontrolled symptomatic type 2 DM since 63 years of age.   -Recent labs reviewed.  - I had a long discussion with him about the progressive nature of diabetes and the pathology behind its complications. -his diabetes is complicated by neuropathy and mild CKD and he remains at a high risk for more acute and chronic complications which include CAD, CVA, CKD, retinopathy, and neuropathy. These are all discussed in detail with him.  - Nutritional counseling repeated at each appointment due to patients tendency to fall back in to old habits.  -  The patient admits there is a room for improvement in their diet and drink choices. -  Suggestion is made for the patient to avoid simple carbohydrates from their diet including Cakes, Sweet Desserts / Pastries, Ice Cream, Soda (diet and regular), Sweet Tea, Candies, Chips, Cookies, Sweet Pastries, Store Bought Juices, Alcohol in Excess of 1-2 drinks a day, Artificial Sweeteners, Coffee Creamer, and "Sugar-free" Products. This will help patient to have stable blood glucose profile and  potentially avoid unintended weight gain.   - I encouraged the patient to switch to unprocessed or minimally processed complex starch and increased protein intake (animal or plant source), fruits, and vegetables.   - Patient is advised to stick to a routine mealtimes to eat 3 meals a day and avoid unnecessary snacks (to snack only to correct hypoglycemia).  - he will be scheduled with Melva Stabile, RDN, CDE for diabetes education.  - I have approached him with the following individualized plan to manage  his diabetes and patient agrees:   -He is advised to continue his Metformin  1000 mg po twice daily with meals.  Will increase his Ozempic  to 1 mg SQ weekly and stop his Glipizide .    -he can take a break from routine glucose monitoring given safe medication regimen.  - Adjustment parameters are given to him for hypo and hyperglycemia in writing.  - Specific targets for  A1c;  LDL, HDL, and Triglycerides were discussed with the patient.  2) Blood Pressure /Hypertension:  his blood pressure is controlled to target.   he is advised to continue his current medications including Losartan  50 mg p.o. daily with breakfast.  3) Lipids/Hyperlipidemia:    Review of his recent lipid panel from 01/14/24 showed controlled LDL at 49.  he is advised to continue Lipitor 20 mg daily at bedtime.  Side effects and precautions discussed with him.  He is also encouraged to avoid fried foods and butter.    4)  Weight/Diet:  his Body mass index is 25.2 kg/m.  -   he is a candidate for some weight loss. I discussed with him the fact that loss of 5 - 10% of his  current body weight will have the most impact on his diabetes management.  Exercise, and detailed carbohydrates information provided  -  detailed on discharge instructions.  5) Chronic Care/Health Maintenance: -he is on ACEI/ARB and Statin medications and is encouraged to initiate and continue to follow up with Ophthalmology, Dentist, Podiatrist at least  yearly or according to recommendations, and advised to stay away from smoking. I have recommended yearly flu vaccine and pneumonia vaccine at least every 5 years; moderate intensity exercise for up to 150 minutes weekly; and sleep for at least 7 hours a day.    - he is advised to maintain close follow up with Cook, Jayce G, DO for primary care needs, as well as his other providers for optimal and coordinated care.     I spent  49  minutes in the care of the patient today including review of labs from CMP, Lipids, Thyroid  Function, Hematology (current and previous including abstractions from other facilities); face-to-face time discussing  his blood glucose readings/logs, discussing hypoglycemia and hyperglycemia episodes and symptoms, medications doses, his options of short and long term treatment based on the latest standards of care / guidelines;  discussion about incorporating lifestyle medicine;  and documenting the encounter. Risk reduction counseling performed per USPSTF guidelines to reduce obesity and cardiovascular risk factors.  Please refer to Patient Instructions for Blood Glucose Monitoring and Insulin /Medications Dosing Guide"  in media tab for additional information. Please  also refer to " Patient Self Inventory" in the Media  tab for reviewed elements of pertinent patient history.  [name] [name], expressed understanding, and voiced agreement with the above plans.  All questions were answered to his satisfaction. he is encouraged to contact clinic should he have any questions or concerns prior to his return visit.   Follow up plan: - Return in about 4 months (around 05/27/2024) for Diabetes F/U with A1c in office, No previsit labs.  Hulon Magic, [facility] [address] [place], [place] [address] Phone: [phone] Fax: [phone]  01/25/2024, 8:44 AM

## 2024-02-05 ENCOUNTER — Other Ambulatory Visit: Payer: Self-pay | Admitting: Family Medicine

## 2024-02-21 ENCOUNTER — Ambulatory Visit: Payer: BC Managed Care – PPO | Admitting: Family Medicine

## 2024-02-21 ENCOUNTER — Encounter: Payer: Self-pay | Admitting: Family Medicine

## 2024-02-21 VITALS — BP 128/68 | HR 79 | Temp 97.3°F | Ht 73.0 in | Wt 185.0 lb

## 2024-02-21 DIAGNOSIS — I10 Essential (primary) hypertension: Secondary | ICD-10-CM | POA: Diagnosis not present

## 2024-02-21 DIAGNOSIS — E611 Iron deficiency: Secondary | ICD-10-CM

## 2024-02-21 DIAGNOSIS — E119 Type 2 diabetes mellitus without complications: Secondary | ICD-10-CM | POA: Diagnosis not present

## 2024-02-21 DIAGNOSIS — R5383 Other fatigue: Secondary | ICD-10-CM | POA: Insufficient documentation

## 2024-02-21 DIAGNOSIS — N5201 Erectile dysfunction due to arterial insufficiency: Secondary | ICD-10-CM

## 2024-02-21 DIAGNOSIS — E785 Hyperlipidemia, unspecified: Secondary | ICD-10-CM

## 2024-02-21 MED ORDER — OMEPRAZOLE 20 MG PO CPDR: 20.0000 mg | DELAYED_RELEASE_CAPSULE | Freq: Two times a day (BID) | ORAL | 3 refills | Status: AC

## 2024-02-21 MED ORDER — TADALAFIL 10 MG PO TABS
10.0000 mg | ORAL_TABLET | Freq: Every day | ORAL | 1 refills | Status: DC | PRN
Start: 2024-02-21 — End: 2024-05-02

## 2024-02-21 NOTE — Assessment & Plan Note (Signed)
 At goal. Continue statin.

## 2024-02-21 NOTE — Patient Instructions (Addendum)
 Continue your medications.  1 lab today.   Follow up in 6 months.

## 2024-02-21 NOTE — Assessment & Plan Note (Signed)
 Discussed the possibility of underlying CAD causing fatigue. Discuss CT coronary calcium . Patient elects to wait at this time.

## 2024-02-21 NOTE — Assessment & Plan Note (Signed)
Trial of Cialis. °

## 2024-02-21 NOTE — Progress Notes (Signed)
 Subjective:  [name], male    DOB: [birth year]  Age: 63 y.o. MRN: [id number]  CC:   Chief Complaint  Patient presents with   6 month follow up     90 day refills where applicable    HPI:  63 year old male presents for follow-up.  Patient reports that he has had some ongoing fatigue for the past several months.  He states that he gets fatigued in the afternoon and stops doing what he is doing early.  This is unusual for him.  Denies chest pain or shortness of breath.  He just reports overall fatigue and decreased energy levels.  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  Improves after rest.  Patient also reports that he is having issues with erectile dysfunction.  He states that he has tried sildenafil  in the past.  He would like to discuss other pharmacotherapeutic options today.  Blood pressure well-controlled on losartan .  Follows closely with endocrinology.  A1c fairly well controlled.  He is on semaglutide  and metformin .  Weight has trended down.  Lipids at goal on Lipitor.  Patient in need of pneumococcal vaccine as well as Tdap and shingles vaccine.  Declines vaccines today.  Patient Active Problem List   Diagnosis Date Noted   Fatigue 02/21/2024   GERD (gastroesophageal reflux disease) 08/20/2022   Iron  deficiency 08/20/2022   Insomnia 08/20/2022   Nocturia associated with benign prostatic hyperplasia 12/20/2020   Osteoarthritis of left knee 12/11/2019   Hyperlipidemia 06/20/2018   Erectile dysfunction 12/16/2016   Essential hypertension 06/19/2016   Type 2 diabetes mellitus (HCC) 03/20/2015    Social Hx   Social History   Socioeconomic History   Marital status: Married    Spouse name: Not on file   Number of children: Not on file   Years of education: Not on file   Highest education level: 12th grade  Occupational History   Not on file  Tobacco Use   Smoking status: Never     Passive exposure: Never   Smokeless tobacco: Never  Vaping Use   Vaping status: Never Used  Substance and Sexual Activity   Alcohol use: No   Drug use: No   Sexual activity: Never  Other Topics Concern   Not on file  Social History Narrative   Not on file   Social Drivers of Health   Financial Resource Strain: Low Risk  (02/17/2023)   Overall Financial Resource Strain (CARDIA)    Difficulty of Paying Living Expenses: Not very hard  Food Insecurity: No Food Insecurity (02/17/2023)   Hunger Vital Sign    Worried About Running Out of Food in the Last Year: Never true    Ran Out of Food in the Last Year: Never true  Transportation Needs: No Transportation Needs (02/17/2023)   PRAPARE - Administrator, Civil Service (Medical): No    Lack of Transportation (Non-Medical): No  Physical Activity: Insufficiently Active (02/17/2023)   Exercise Vital Sign    Days of Exercise per Week: 3 days    Minutes of Exercise per Session: 30 min  Stress: No Stress Concern Present (02/17/2023)   [employer] of Occupational Health - Occupational Stress Questionnaire    Feeling of Stress : Not at all  Social Connections: Socially Integrated (02/17/2023)   Social Connection and Isolation Panel [NHANES]    Frequency of Communication with Friends and Family: More than three times  a week    Frequency of Social Gatherings with Friends and Family: More than three times a week    Attends Religious Services: More than 4 times per year    Active Member of Clubs or Organizations: Yes    Attends Engineer, structural: More than 4 times per year    Marital Status: Married    Review of Systems Per HPI  Objective:  BP 128/68   Pulse 79   Temp (!) 97.3 F (36.3 C)   Ht 6\' 1"  (1.854 m)   Wt 185 lb (83.9 kg)   SpO2 100%   BMI 24.41 kg/m      02/21/2024    8:39 AM 01/25/2024    8:10 AM 10/28/2023    8:02 AM  BP/Weight  Systolic BP 128 110 107  Diastolic BP 68 70 66  Wt. (Lbs) 185 191  194.8  BMI 24.41 kg/m2 25.2 kg/m2 25.7 kg/m2    Physical Exam Vitals and nursing note reviewed.  Constitutional:      General: He is not in acute distress.    Appearance: Normal appearance.  HENT:     Head: Normocephalic and atraumatic.  Eyes:     General:        Right eye: No discharge.        Left eye: No discharge.     Conjunctiva/sclera: Conjunctivae normal.  Cardiovascular:     Rate and Rhythm: Normal rate and regular rhythm.  Pulmonary:     Effort: Pulmonary effort is normal.     Breath sounds: Normal breath sounds. No wheezing, rhonchi or rales.  Neurological:     Mental Status: He is alert.  Psychiatric:        Mood and Affect: Mood normal.        Behavior: Behavior normal.     Lab Results  Component Value Date   WBC 8.8 02/17/2022   HGB 13.1 02/17/2022   HCT 40.4 02/17/2022   PLT 188 02/17/2022   GLUCOSE 164 (H) 01/14/2024   CHOL 105 01/14/2024   TRIG 87 01/14/2024   HDL 39 (L) 01/14/2024   LDLCALC 49 01/14/2024   ALT 22 01/14/2024   AST 19 01/14/2024   NA 138 01/14/2024   K 4.2 01/14/2024   CL 103 01/14/2024   CREATININE 1.02 01/14/2024   BUN 16 01/14/2024   CO2 19 (L) 01/14/2024   TSH 3.170 01/14/2024   PSA 0.79 04/18/2014   INR 1.2 12/04/2019   HGBA1C 7.3 (A) 01/25/2024   MICROALBUR 10 07/28/2023     Assessment & Plan:  Essential hypertension Assessment & Plan: Stable on Losartan .   Iron  deficiency -     CBC -     Iron , TIBC and Ferritin Panel  Erectile dysfunction due to arterial insufficiency Assessment & Plan: Trial of Cialis .  Orders: -     Tadalafil ; Take 1 tablet (10 mg total) by mouth daily as needed for erectile dysfunction.  Dispense: 10 tablet; Refill: 1  Type 2 diabetes mellitus without complication, without long-term current use of insulin  (HCC) Assessment & Plan: Fairly well-controlled.  Follows with endocrinology.  Continue current medications.   Hyperlipidemia, unspecified hyperlipidemia type Assessment &  Plan: At goal. Continue statin.   Other fatigue Assessment & Plan: Discussed the possibility of underlying CAD causing fatigue. Discuss CT coronary calcium . Patient elects to wait at this time.   Other orders -     Omeprazole ; Take 1 capsule (20 mg total) by mouth 2 (two) times  daily before a meal.  Dispense: 180 capsule; Refill: 3    Follow-up:  6 months   Inaki Vantine Debrah Fan DO [facility]

## 2024-02-21 NOTE — Assessment & Plan Note (Signed)
 Stable on Losartan

## 2024-02-21 NOTE — Assessment & Plan Note (Signed)
 Fairly well-controlled.  Follows with endocrinology.  Continue current medications.

## 2024-02-22 ENCOUNTER — Ambulatory Visit: Payer: Self-pay | Admitting: Family Medicine

## 2024-02-22 ENCOUNTER — Other Ambulatory Visit: Payer: Self-pay | Admitting: Family Medicine

## 2024-02-22 LAB — IRON,TIBC AND FERRITIN PANEL
Ferritin: 15 ng/mL — ABNORMAL LOW (ref 30–400)
Iron Saturation: 7 % — CL (ref 15–55)
Iron: 27 ug/dL — ABNORMAL LOW (ref 38–169)
Total Iron Binding Capacity: 366 ug/dL (ref 250–450)
UIBC: 339 ug/dL (ref 111–343)

## 2024-02-22 LAB — CBC
Hematocrit: 39.2 % (ref 37.5–51.0)
Hemoglobin: 12.3 g/dL — ABNORMAL LOW (ref 13.0–17.7)
MCH: 29 pg (ref 26.6–33.0)
MCHC: 31.4 g/dL — ABNORMAL LOW (ref 31.5–35.7)
MCV: 93 fL (ref 79–97)
Platelets: 191 10*3/uL (ref 150–450)
RBC: 4.24 x10E6/uL (ref 4.14–5.80)
RDW: 13.4 % (ref 11.6–15.4)
WBC: 8.1 10*3/uL (ref 3.4–10.8)

## 2024-02-22 MED ORDER — IRON (FERROUS SULFATE) 325 (65 FE) MG PO TABS: 325.0000 mg | ORAL_TABLET | ORAL | 1 refills | Status: DC

## 2024-03-09 ENCOUNTER — Other Ambulatory Visit: Payer: Self-pay | Admitting: Family Medicine

## 2024-03-09 DIAGNOSIS — G4700 Insomnia, unspecified: Secondary | ICD-10-CM

## 2024-03-15 ENCOUNTER — Telehealth: Payer: Self-pay | Admitting: *Deleted

## 2024-03-15 NOTE — Telephone Encounter (Signed)
 Have him lower dose of Metformin  to 500 mg twice daily and see if it helps with his stomach issues.  Remind him that eating a healthy diet will help prevent some unpleasant side effects when using GLP1 medications.

## 2024-03-15 NOTE — Telephone Encounter (Signed)
 Patient left a voice message that he is taking Metformin  1000 mg twice a day , and injecting Ozempic  1 mg weekly. He is having a lot of stomach issues. He questions if it may be these medications. At his office visit on 01/25/24 the Ozempic  was increased to 1 mg.

## 2024-03-16 NOTE — Telephone Encounter (Signed)
 Patient was called and a message was left with Whitney's recommendation.

## 2024-03-22 ENCOUNTER — Ambulatory Visit: Admitting: Family Medicine

## 2024-03-22 ENCOUNTER — Encounter: Payer: Self-pay | Admitting: Family Medicine

## 2024-03-22 VITALS — BP 110/62 | HR 81 | Temp 97.6°F | Ht 73.0 in | Wt 183.0 lb

## 2024-03-22 DIAGNOSIS — Z7985 Long-term (current) use of injectable non-insulin antidiabetic drugs: Secondary | ICD-10-CM

## 2024-03-22 DIAGNOSIS — E119 Type 2 diabetes mellitus without complications: Secondary | ICD-10-CM | POA: Diagnosis not present

## 2024-03-22 DIAGNOSIS — K4090 Unilateral inguinal hernia, without obstruction or gangrene, not specified as recurrent: Secondary | ICD-10-CM | POA: Diagnosis not present

## 2024-03-22 NOTE — Progress Notes (Unsigned)
 Subjective:  [name], male    DOB: May 17, 1961  Age: 63 y.o. MRN: [id number]  CC:   Chief Complaint  Patient presents with   Hernia    Hernia concerns in right groin area   Medication Management    Ozempic  concerns-upset stomach    HPI:  63 year old male presents for evaluation of the above.  Patient reports that over the past week or so he has noticed bulging and pain in the right inguinal region.  He believes that he has a hernia.  He has a history of left inguinal hernia per his report.  Had surgery many years ago.  Patient believes that he needs to see a Careers adviser.  Additionally, patient has had some GI upset in relation to his diabetes medications.  These have recently been changed.  He is followed by endocrinology.  Patient Active Problem List   Diagnosis Date Noted   Right inguinal hernia 03/23/2024   Fatigue 02/21/2024   GERD (gastroesophageal reflux disease) 08/20/2022   Iron  deficiency 08/20/2022   Insomnia 08/20/2022   Nocturia associated with benign prostatic hyperplasia 12/20/2020   Osteoarthritis of left knee 12/11/2019   Hyperlipidemia 06/20/2018   Erectile dysfunction 12/16/2016   Essential hypertension 06/19/2016   Type 2 diabetes mellitus (HCC) 03/20/2015    Social Hx   Social History   Socioeconomic History   Marital status: Married    Spouse name: Not on file   Number of children: Not on file   Years of education: Not on file   Highest education level: 12th grade  Occupational History   Not on file  Tobacco Use   Smoking status: Never    Passive exposure: Never   Smokeless tobacco: Never  Vaping Use   Vaping status: Never Used  Substance and Sexual Activity   Alcohol use: No   Drug use: No   Sexual activity: Never  Other Topics Concern   Not on file  Social History Narrative   Not on file   Social Drivers of Health   Financial Resource Strain: Low Risk  (02/17/2023)   Overall Financial Resource Strain (CARDIA)     Difficulty of Paying Living Expenses: Not very hard  Food Insecurity: No Food Insecurity (02/17/2023)   Hunger Vital Sign    Worried About Running Out of Food in the Last Year: Never true    Ran Out of Food in the Last Year: Never true  Transportation Needs: No Transportation Needs (02/17/2023)   PRAPARE - Administrator, Civil Service (Medical): No    Lack of Transportation (Non-Medical): No  Physical Activity: Insufficiently Active (02/17/2023)   Exercise Vital Sign    Days of Exercise per Week: 3 days    Minutes of Exercise per Session: 30 min  Stress: No Stress Concern Present (02/17/2023)   [employer] of Occupational Health - Occupational Stress Questionnaire    Feeling of Stress : Not at all  Social Connections: Socially Integrated (02/17/2023)   Social Connection and Isolation Panel    Frequency of Communication with Friends and Family: More than three times a week    Frequency of Social Gatherings with Friends and Family: More than three times a week    Attends Religious Services: More than 4 times per year    Active Member of [employer] or Organizations: Yes    Attends Engineer, structural: More than 4 times per year    Marital Status: Married    Review  of Systems Per HPI  Objective:  BP 110/62   Pulse 81   Temp 97.6 F (36.4 C)   Ht 6' 1 (1.854 m)   Wt 183 lb (83 kg)   SpO2 96%   BMI 24.14 kg/m      03/22/2024    2:49 PM 02/21/2024    8:39 AM 01/25/2024    8:10 AM  BP/Weight  Systolic BP 110 128 110  Diastolic BP 62 68 70  Wt. (Lbs) 183 185 191  BMI 24.14 kg/m2 24.41 kg/m2 25.2 kg/m2    Physical Exam Constitutional:      General: He is not in acute distress.    Appearance: Normal appearance.  HENT:     Head: Normocephalic and atraumatic.  Pulmonary:     Effort: Pulmonary effort is normal. No respiratory distress.  Genitourinary:    Comments: Large right inguinal hernia. Neurological:     Mental Status: He is alert.  Psychiatric:         Mood and Affect: Mood normal.        Behavior: Behavior normal.     Lab Results  Component Value Date   WBC 8.1 02/21/2024   HGB 12.3 (L) 02/21/2024   HCT 39.2 02/21/2024   PLT 191 02/21/2024   GLUCOSE 164 (H) 01/14/2024   CHOL 105 01/14/2024   TRIG 87 01/14/2024   HDL 39 (L) 01/14/2024   LDLCALC 49 01/14/2024   ALT 22 01/14/2024   AST 19 01/14/2024   NA 138 01/14/2024   K 4.2 01/14/2024   CL 103 01/14/2024   CREATININE 1.02 01/14/2024   BUN 16 01/14/2024   CO2 19 (L) 01/14/2024   TSH 3.170 01/14/2024   PSA 0.79 04/18/2014   INR 1.2 12/04/2019   HGBA1C 7.3 (A) 01/25/2024   MICROALBUR 10 07/28/2023     Assessment & Plan:  Right inguinal hernia Assessment & Plan: Experiencing pain/discomfort.  He is an active gentleman.  Needs surgical repair.  Referring to general surgery.  Orders: -     Ambulatory referral to General Surgery  Type 2 diabetes mellitus without complication, without long-term current use of insulin  [facility]) Assessment & Plan: Patient experiencing side effects.  Recent increase in Ozempic  dose and decrease in metformin .  Advised him to reach out to endocrinology for further guidance.     Follow-up:  Return if symptoms worsen or fail to improve.  Jacqulyn Ahle DO [facility]

## 2024-03-23 DIAGNOSIS — K4090 Unilateral inguinal hernia, without obstruction or gangrene, not specified as recurrent: Secondary | ICD-10-CM | POA: Insufficient documentation

## 2024-03-23 NOTE — Assessment & Plan Note (Signed)
 Experiencing pain/discomfort.  He is an active gentleman.  Needs surgical repair.  Referring to general surgery.

## 2024-03-23 NOTE — Assessment & Plan Note (Signed)
 Patient experiencing side effects.  Recent increase in Ozempic  dose and decrease in metformin .  Advised him to reach out to endocrinology for further guidance.

## 2024-03-27 ENCOUNTER — Telehealth: Payer: Self-pay | Admitting: *Deleted

## 2024-03-27 NOTE — Telephone Encounter (Signed)
 Patient was called and given [name] [name]'s recommendation. He stated that he was in agreement.

## 2024-03-27 NOTE — Telephone Encounter (Signed)
 Patient left a voicemail. He states that he is having stomach pain, and that Dr. Bluford suggested that it may be the Metformin  and the Ozempic .. Patient is asking if there is something else that can be done with the medications. Currently , the patient is taking Metformin  1000 mg twice daily , Ozempic  1 mg weekly. He also shared that he now has a hernia and that he has to get that taken care of.

## 2024-03-27 NOTE — Telephone Encounter (Signed)
 Have him lower the dose of Ozempic  to 0.5 mg weekly (can listen to the clicks on the 1 mg pen and only inject 37 clicks which equals the 0.5 mg dose).  Have him try this first and then we can continue to adjust meds as needed.

## 2024-04-10 ENCOUNTER — Telehealth: Payer: Self-pay | Admitting: Nurse Practitioner

## 2024-04-10 NOTE — Telephone Encounter (Signed)
 Just have him stop it altogether and then keep an eye on glucose.  We may look to add a different medication if glucose goes uncontrolled.

## 2024-04-10 NOTE — Telephone Encounter (Signed)
 Patient was called and made aware. He has appointment August 5 with surgeon for his hernia. He will reach back out to us .

## 2024-04-10 NOTE — Telephone Encounter (Signed)
 Will you call pt and let him know in case he has questions about other medications

## 2024-04-10 NOTE — Telephone Encounter (Signed)
 Pt states ozempic  is killing his stomach and wants to know about stopping that and starting back on something else.

## 2024-04-25 ENCOUNTER — Encounter: Payer: Self-pay | Admitting: General Surgery

## 2024-04-25 ENCOUNTER — Ambulatory Visit: Admitting: General Surgery

## 2024-04-25 VITALS — BP 106/76 | HR 89 | Temp 98.2°F | Resp 16 | Ht 73.0 in | Wt 170.0 lb

## 2024-04-25 DIAGNOSIS — K4090 Unilateral inguinal hernia, without obstruction or gangrene, not specified as recurrent: Secondary | ICD-10-CM

## 2024-04-26 NOTE — Progress Notes (Signed)
 [name] [name]; [id number]; 10-Aug-1961   HPI Patient is a 63 year old white male who was referred to my care by Dr. Bluford for evaluation and treatment of a right annual hernia.  Patient states he noted swelling and pain in the right groin region a few months ago.  The swelling comes and goes.  It is starting to affect his daily activity.  It does get worse with straining.  Patient is status post a left inguinal herniorrhaphy with mesh in the remote past. Past Medical History:  Diagnosis Date   Anxiety    Arthritis    Diabetes mellitus without complication (HCC)    Diabetes mellitus, type II (HCC)    Diverticulosis    pt unaware   ED (erectile dysfunction)    Fatty liver    GERD (gastroesophageal reflux disease)    History of COVID-19 07/2019   History of left inguinal hernia    History of retinal detachment    Right   Hyperlipidemia    Hypertension    Insomnia    Low ferritin level    Numbness and tingling of both feet    Shortness of breath dyspnea    since having COVID 07/2019 with exertions    Past Surgical History:  Procedure Laterality Date   BIOPSY  04/21/2022   Procedure: BIOPSY;  Surgeon: Cindie Carlin POUR, DO;  [facility];  Service: Endoscopy;;   CHOLECYSTECTOMY     COLONOSCOPY N/A 11/30/2014   Procedure: COLONOSCOPY;  Surgeon: Lamar CHRISTELLA Hollingshead, MD;  [facility]: [facility];  Service: Endoscopy;  Laterality: N/A;  7:30 Am   COLONOSCOPY WITH PROPOFOL  N/A 04/21/2022   Procedure: COLONOSCOPY WITH PROPOFOL ;  Surgeon: Cindie Carlin POUR, DO;  [facility]: [facility];  Service: Endoscopy;  Laterality: N/A;  9:00am   ESOPHAGEAL DILATION     HERNIA REPAIR     1981   POLYPECTOMY  04/21/2022   Procedure: POLYPECTOMY;  Surgeon: Cindie Carlin POUR, DO;  [facility]: [facility];  Service: Endoscopy;;   RETINAL DETACHMENT SURGERY Right    TOTAL KNEE ARTHROPLASTY Left 12/11/2019   Procedure: TOTAL KNEE ARTHROPLASTY;  Surgeon: Melodi Lerner, MD;  Location: [facility];  Service:  Orthopedics;  Laterality: Left;    VASCULAR SURGERY      Family History  Problem Relation Age of Onset   Diabetes Mother    Neuropathy Mother     Current Outpatient Medications on File Prior to Visit  Medication Sig Dispense Refill   acetaminophen  (TYLENOL ) 500 MG tablet Take 1,000 mg by mouth every morning.     atorvastatin  (LIPITOR) 20 MG tablet Take 1 tablet (20 mg total) by mouth at bedtime. 90 tablet 3   CONTOUR NEXT TEST test strip USE TO TEST BLOOD SUGAR ONCE DAILY. 70 each 2   dutasteride  (AVODART ) 0.5 MG capsule take 1 capsule (0.5 MILLIGRAM total) by mouth daily. 90 capsule 1   fluticasone  (FLONASE ) 50 MCG/ACT nasal spray use 2 sprays in each nostril every day, as needed. 50 mL 0   Iron , Ferrous Sulfate , 325 (65 Fe) MG TABS Take 325 mg by mouth every other day. 45 tablet 1   LORazepam  (ATIVAN ) 1 MG tablet take 1/2-1 tablet by mouth at bedtime as needed for anxiety/insomnia. 90 tablet 0   losartan  (COZAAR ) 50 MG tablet take 1 tablet (50 MILLIGRAM total) by mouth daily. 90 tablet 3   omeprazole  (PRILOSEC) 20 MG capsule Take 1 capsule (20 mg total) by mouth 2 (two) times daily before  a meal. 180 capsule 3   ONETOUCH DELICA LANCETS 33G MISC   0   tadalafil  (CIALIS ) 10 MG tablet Take 1 tablet (10 mg total) by mouth daily as needed for erectile dysfunction. 10 tablet 1   tamsulosin  (FLOMAX ) 0.4 MG CAPS capsule take 2 capsules (0.8 MILLIGRAM total) by mouth at bedtime. 180 capsule 1   traMADol  (ULTRAM ) 50 MG tablet Take 1 tablet (50 mg total) by mouth every 12 (twelve) hours as needed for moderate pain (pain score 4-6) or severe pain (pain score 7-10). 60 tablet 3   metFORMIN  (GLUCOPHAGE ) 500 MG tablet Take 2 tablets (1,000 mg total) by mouth 2 (two) times daily with a meal. (Patient not taking: Reported on 04/25/2024) 360 tablet 3   Semaglutide , 1 MG/DOSE, 4 MG/3ML SOPN Inject 1 mg as directed once a week. (Patient not taking: Reported on 04/25/2024) 3 mL 3   No current  facility-administered medications on file prior to visit.    Allergies  Allergen Reactions   Dilaudid  [Hydromorphone  Hcl] Other (See Comments)    PT SAID HE PASSED OUT WHEN HE TOOK IT    Social History   Substance and Sexual Activity  Alcohol Use No    Social History   Tobacco Use  Smoking Status Never   Passive exposure: Never  Smokeless Tobacco Never    Review of Systems  Constitutional:  Positive for malaise/fatigue.  HENT: Negative.    Eyes: Negative.   Respiratory: Negative.    Cardiovascular: Negative.   Gastrointestinal:  Positive for abdominal pain and heartburn.  Genitourinary:  Positive for frequency.  Musculoskeletal: Negative.   Skin: Negative.   Neurological: Negative.   Endo/Heme/Allergies: Negative.   Psychiatric/Behavioral: Negative.      Objective   Vitals:   04/25/24 1409  BP: 106/76  Pulse: 89  Resp: 16  Temp: 98.2 F (36.8 C)  SpO2: 96%    Physical Exam Vitals reviewed.  Constitutional:      Appearance: Normal appearance. He is normal weight. He is not ill-appearing.  HENT:     Head: Normocephalic and atraumatic.  Cardiovascular:     Rate and Rhythm: Normal rate and regular rhythm.     Heart sounds: Normal heart sounds. No murmur heard.    No friction rub. No gallop.  Pulmonary:     Effort: Pulmonary effort is normal. No respiratory distress.     Breath sounds: Normal breath sounds. No stridor. No wheezing, rhonchi or rales.  Abdominal:     General: Bowel sounds are normal. There is no distension.     Palpations: Abdomen is soft. There is no mass.     Tenderness: There is no abdominal tenderness. There is no guarding or rebound.     Hernia: A hernia is present.     Comments: Reducible right inguinal hernia.  No left inguinal hernia present.  Genitourinary:    Testes: Normal.  Skin:    General: Skin is warm and dry.  Neurological:     Mental Status: He is alert and oriented to person, place, and time.     Assessment   Right inguinal hernia Plan  Patient is scheduled for robotic assisted laparoscopic right inguinal herniorrhaphy with mesh on 05/11/2023.  The risks and benefits of the procedure including bleeding, infection, mesh use, and the possibility of recurrence of the hernia were fully explained to the patient, who gave informed consent.  He was instructed to stay off his Ozempic  which she has already done.  He may resume  metformin  to help control his blood sugars.

## 2024-04-26 NOTE — H&P (Signed)
 [name] [name]; [id number]; 10-Aug-1961   HPI Patient is a 63 year old white male who was referred to my care by Dr. Bluford for evaluation and treatment of a right annual hernia.  Patient states he noted swelling and pain in the right groin region a few months ago.  The swelling comes and goes.  It is starting to affect his daily activity.  It does get worse with straining.  Patient is status post a left inguinal herniorrhaphy with mesh in the remote past. Past Medical History:  Diagnosis Date   Anxiety    Arthritis    Diabetes mellitus without complication (HCC)    Diabetes mellitus, type II (HCC)    Diverticulosis    pt unaware   ED (erectile dysfunction)    Fatty liver    GERD (gastroesophageal reflux disease)    History of COVID-19 07/2019   History of left inguinal hernia    History of retinal detachment    Right   Hyperlipidemia    Hypertension    Insomnia    Low ferritin level    Numbness and tingling of both feet    Shortness of breath dyspnea    since having COVID 07/2019 with exertions    Past Surgical History:  Procedure Laterality Date   BIOPSY  04/21/2022   Procedure: BIOPSY;  Surgeon: Cindie Carlin POUR, DO;  [facility];  Service: Endoscopy;;   CHOLECYSTECTOMY     COLONOSCOPY N/A 11/30/2014   Procedure: COLONOSCOPY;  Surgeon: Lamar CHRISTELLA Hollingshead, MD;  [facility];  Service: Endoscopy;  Laterality: N/A;  7:30 Am   COLONOSCOPY WITH PROPOFOL  N/A 04/21/2022   Procedure: COLONOSCOPY WITH PROPOFOL ;  Surgeon: Cindie Carlin POUR, DO;  [facility];  Service: Endoscopy;  Laterality: N/A;  9:00am   ESOPHAGEAL DILATION     HERNIA REPAIR     1981   POLYPECTOMY  04/21/2022   Procedure: POLYPECTOMY;  Surgeon: Cindie Carlin POUR, DO;  [facility];  Service: Endoscopy;;   RETINAL DETACHMENT SURGERY Right    TOTAL KNEE ARTHROPLASTY Left 12/11/2019   Procedure: TOTAL KNEE ARTHROPLASTY;  Surgeon: Melodi Lerner, MD;  Location: [facility];  Service:  Orthopedics;  Laterality: Left;    VASCULAR SURGERY      Family History  Problem Relation Age of Onset   Diabetes Mother    Neuropathy Mother     Current Outpatient Medications on File Prior to Visit  Medication Sig Dispense Refill   acetaminophen  (TYLENOL ) 500 MG tablet Take 1,000 mg by mouth every morning.     atorvastatin  (LIPITOR) 20 MG tablet Take 1 tablet (20 mg total) by mouth at bedtime. 90 tablet 3   CONTOUR NEXT TEST test strip USE TO TEST BLOOD SUGAR ONCE DAILY. 70 each 2   dutasteride  (AVODART ) 0.5 MG capsule take 1 capsule (0.5 MILLIGRAM total) by mouth daily. 90 capsule 1   fluticasone  (FLONASE ) 50 MCG/ACT nasal spray use 2 sprays in each nostril every day, as needed. 50 mL 0   Iron , Ferrous Sulfate , 325 (65 Fe) MG TABS Take 325 mg by mouth every other day. 45 tablet 1   LORazepam  (ATIVAN ) 1 MG tablet take 1/2-1 tablet by mouth at bedtime as needed for anxiety/insomnia. 90 tablet 0   losartan  (COZAAR ) 50 MG tablet take 1 tablet (50 MILLIGRAM total) by mouth daily. 90 tablet 3   omeprazole  (PRILOSEC) 20 MG capsule Take 1 capsule (20 mg total) by mouth 2 (two) times daily before  a meal. 180 capsule 3   ONETOUCH DELICA LANCETS 33G MISC   0   tadalafil  (CIALIS ) 10 MG tablet Take 1 tablet (10 mg total) by mouth daily as needed for erectile dysfunction. 10 tablet 1   tamsulosin  (FLOMAX ) 0.4 MG CAPS capsule take 2 capsules (0.8 MILLIGRAM total) by mouth at bedtime. 180 capsule 1   traMADol  (ULTRAM ) 50 MG tablet Take 1 tablet (50 mg total) by mouth every 12 (twelve) hours as needed for moderate pain (pain score 4-6) or severe pain (pain score 7-10). 60 tablet 3   metFORMIN  (GLUCOPHAGE ) 500 MG tablet Take 2 tablets (1,000 mg total) by mouth 2 (two) times daily with a meal. (Patient not taking: Reported on 04/25/2024) 360 tablet 3   Semaglutide , 1 MG/DOSE, 4 MG/3ML SOPN Inject 1 mg as directed once a week. (Patient not taking: Reported on 04/25/2024) 3 mL 3   No current  facility-administered medications on file prior to visit.    Allergies  Allergen Reactions   Dilaudid  [Hydromorphone  Hcl] Other (See Comments)    PT SAID HE PASSED OUT WHEN HE TOOK IT    Social History   Substance and Sexual Activity  Alcohol Use No    Social History   Tobacco Use  Smoking Status Never   Passive exposure: Never  Smokeless Tobacco Never    Review of Systems  Constitutional:  Positive for malaise/fatigue.  HENT: Negative.    Eyes: Negative.   Respiratory: Negative.    Cardiovascular: Negative.   Gastrointestinal:  Positive for abdominal pain and heartburn.  Genitourinary:  Positive for frequency.  Musculoskeletal: Negative.   Skin: Negative.   Neurological: Negative.   Endo/Heme/Allergies: Negative.   Psychiatric/Behavioral: Negative.      Objective   Vitals:   04/25/24 1409  BP: 106/76  Pulse: 89  Resp: 16  Temp: 98.2 F (36.8 C)  SpO2: 96%    Physical Exam Vitals reviewed.  Constitutional:      Appearance: Normal appearance. He is normal weight. He is not ill-appearing.  HENT:     Head: Normocephalic and atraumatic.  Cardiovascular:     Rate and Rhythm: Normal rate and regular rhythm.     Heart sounds: Normal heart sounds. No murmur heard.    No friction rub. No gallop.  Pulmonary:     Effort: Pulmonary effort is normal. No respiratory distress.     Breath sounds: Normal breath sounds. No stridor. No wheezing, rhonchi or rales.  Abdominal:     General: Bowel sounds are normal. There is no distension.     Palpations: Abdomen is soft. There is no mass.     Tenderness: There is no abdominal tenderness. There is no guarding or rebound.     Hernia: A hernia is present.     Comments: Reducible right inguinal hernia.  No left inguinal hernia present.  Genitourinary:    Testes: Normal.  Skin:    General: Skin is warm and dry.  Neurological:     Mental Status: He is alert and oriented to person, place, and time.     Assessment   Right inguinal hernia Plan  Patient is scheduled for robotic assisted laparoscopic right inguinal herniorrhaphy with mesh on 05/11/2023.  The risks and benefits of the procedure including bleeding, infection, mesh use, and the possibility of recurrence of the hernia were fully explained to the patient, who gave informed consent.  He was instructed to stay off his Ozempic  which she has already done.  He may resume  metformin  to help control his blood sugars.

## 2024-05-03 NOTE — Patient Instructions (Signed)
 [name] [name]  05/03/2024     @PREFPERIOPPHARMACY @   Your procedure is scheduled on  05/10/2024.   Report to [facility] at  0600 A.M.   Call this number if you have problems the morning of surgery:  [phone]  If you experience any cold or flu symptoms such as cough, fever, chills, shortness of breath, etc. between now and your scheduled surgery, please notify us  at the above number.   Remember:  Do not eat after midnight.   You may drink clear liquids until 0330 am on 05/10/2024.    Clear liquids allowed are:                    Water , Juice (No red color; non-citric and without pulp; diabetics please choose diet or no sugar options), Carbonated beverages (diabetics please choose diet or no sugar options), Clear Tea (No creamer, milk, or cream, including half & half and powdered creamer), Black Coffee Only (No creamer, milk or cream, including half & half and powdered creamer), and Clear Sports drink (No red color; diabetics please choose diet or no sugar options)    Take these medicines the morning of surgery with A SIP OF WATER                                                     omeprazole .    Do not wear jewelry, make-up or nail polish, including gel polish,  artificial nails, or any other type of covering on natural nails (fingers and  toes).  Do not wear lotions, powders, or perfumes, or deodorant.  Do not shave 48 hours prior to surgery.  Men may shave face and neck.  Do not bring valuables to the hospital.  [facility] is not responsible for any belongings or valuables.  Contacts, dentures or bridgework may not be worn into surgery.  Leave your suitcase in the car.  After surgery it may be brought to your room.  For patients admitted to the hospital, discharge time will be determined by your treatment team.  Patients discharged the day of surgery will not be allowed to drive home and must have someone with them for 24 hours.    Special instructions:   DO  NOT smoke tobacco or vape for 24 hours before your procedure.  Please read over the following fact sheets that you were given. Coughing and Deep Breathing, Surgical Site Infection Prevention, Anesthesia Post-op Instructions, and Care and Recovery After Surgery      Laparoscopic Surgery for Groin Hernia in Adults: What to Know After After a laparoscopic surgery for groin hernia, it's common to have pain, discomfort, soreness, swelling, and bruising around the cuts that were made in the belly. There may also be swelling of the scrotum in males. Follow these instructions at home: Activity Rest as told. Get up and take short walks many times during the day. This helps you breathe better and keeps your blood flowing. Ask for help if you feel weak or unsteady. Ask if it's OK for you to lift. Do not take baths, swim, or use a hot tub until you're told it's OK. Ask if you can shower. Ask what things are safe for you to do at home. Ask when you can go back to work or school. Medicines Take  your medicines only as told. You may need to take steps to help treat or prevent trouble pooping (constipation), such as: Taking medicine to help you poop. Eating foods high in fiber, like beans, whole grains, and fresh fruits and vegetables. Drinking more fluids as told. Ask your health care provider if it's safe to drive or use machines while taking your medicine. Wound care  Take care of the cuts in your belly as told. Make sure you: Wash your hands with soap and water  for at least 20 seconds before and after you change your bandage. If you can't use soap and water , use hand sanitizer. Change your bandage. Leave stitches or skin glue alone. Leave tape strips alone unless you're told to take them off. You may trim the edges of the tape strips if they curl up. Check the cuts on your belly every day for signs of infection. Check for: More redness, swelling, or pain. More fluid or blood. Warmth. Pus or a  bad smell. Pain management  Use ice or an ice pack as told. Place a towel between your skin and the ice. Leave the ice on for 20 minutes, 2-3 times a day. If your skin turns red, take off the ice right away to prevent skin damage. The risk of damage is higher if you can't feel pain, heat, or cold. General instructions Do not smoke, vape, or use nicotine or tobacco. Doing this can slow healing. Wear compression stockings to reduce swelling and help prevent blood clots in your legs. You may be asked to continue to do deep breathing exercises at home. This will help to prevent a lung infection. Your provider may give you more instructions. Make sure you know what you can and can't do. Contact a health care provider if: You have any signs of infection. You have more swelling or pain in your scrotum. You have pain that gets worse or doesn't get better with medicine. You aren't able to pee. You haven't pooped in 3 days. You have a fever. You throw up or you feel like throwing up. Get help right away if: You have redness, warmth, or pain in your leg. You have chest pain. You have trouble breathing. You have very bad pain in your belly. You throw up each time you eat or drink. These symptoms may be an emergency. Call 911 right away. Do not wait to see if the symptoms will go away. Do not drive yourself to the hospital. This information is not intended to replace advice given to you by your health care provider. Make sure you discuss any questions you have with your health care provider. Document Revised: 06/22/2023 Document Reviewed: 06/22/2023 Elsevier Patient Education  2025 Elsevier Inc.General Anesthesia, Adult, Care After The following information offers guidance on how to care for yourself after your procedure. Your health care provider may also give you more specific instructions. If you have problems or questions, contact your health care provider. What can I expect after the  procedure? After the procedure, it is common for people to: Have pain or discomfort at the IV site. Have nausea or vomiting. Have a sore throat or hoarseness. Have trouble concentrating. Feel cold or chills. Feel weak, sleepy, or tired (fatigue). Have soreness and body aches. These can affect parts of the body that were not involved in surgery. Follow these instructions at home: For the time period you were told by your health care provider:  Rest. Do not participate in activities where you could fall or  become injured. Do not drive or use machinery. Do not drink alcohol. Do not take sleeping pills or medicines that cause drowsiness. Do not make important decisions or sign legal documents. Do not take care of children on your own. General instructions Drink enough fluid to keep your urine pale yellow. If you have sleep apnea, surgery and certain medicines can increase your risk for breathing problems. Follow instructions from your health care provider about wearing your sleep device: Anytime you are sleeping, including during daytime naps. While taking prescription pain medicines, sleeping medicines, or medicines that make you drowsy. Return to your normal activities as told by your health care provider. Ask your health care provider what activities are safe for you. Take over-the-counter and prescription medicines only as told by your health care provider. Do not use any products that contain nicotine or tobacco. These products include cigarettes, chewing tobacco, and vaping devices, such as e-cigarettes. These can delay incision healing after surgery. If you need help quitting, ask your health care provider. Contact a health care provider if: You have nausea or vomiting that does not get better with medicine. You vomit every time you eat or drink. You have pain that does not get better with medicine. You cannot urinate or have bloody urine. You develop a skin rash. You have a  fever. Get help right away if: You have trouble breathing. You have chest pain. You vomit blood. These symptoms may be an emergency. Get help right away. Call 911. Do not wait to see if the symptoms will go away. Do not drive yourself to the hospital. Summary After the procedure, it is common to have a sore throat, hoarseness, nausea, vomiting, or to feel weak, sleepy, or fatigue. For the time period you were told by your health care provider, do not drive or use machinery. Get help right away if you have difficulty breathing, have chest pain, or vomit blood. These symptoms may be an emergency. This information is not intended to replace advice given to you by your health care provider. Make sure you discuss any questions you have with your health care provider. Document Revised: 12/05/2021 Document Reviewed: 12/05/2021 Elsevier Patient Education  2024 Elsevier Inc.How to Use Chlorhexidine  at Home in the Shower Chlorhexidine  gluconate (CHG) is a germ-killing (antiseptic) wash that's used to clean the skin. It can get rid of the germs that normally live on the skin and can keep them away for about 24 hours. If you're having surgery, you may be told to shower with CHG at home the night before surgery. This can help lower your risk for infection. To use CHG wash in the shower, follow the steps below. Supplies needed: CHG body wash. Clean washcloth. Clean towel. How to use CHG in the shower Follow these steps unless you're told to use CHG in a different way: Start the shower. Use your normal soap and shampoo to wash your face and hair. Turn off the shower or move out of the shower stream. Pour CHG onto a clean washcloth. Do not use any type of brush or rough sponge. Start at your neck, washing your body down to your toes. Make sure you: Wash the part of your body where the surgery will be done for at least 1 minute. Do not scrub. Do not use CHG on your head or face unless your health care  provider tells you to. If it gets into your ears or eyes, rinse them well with water . Do not wash your genitals with  CHG. Wash your back and under your arms. Make sure to wash skin folds. Let the CHG sit on your skin for 1-2 minutes or as long as told. Rinse your entire body in the shower, including all body creases and folds. Turn off the shower. Dry off with a clean towel. Do not put anything on your skin afterward, such as powder, lotion, or perfume. Put on clean clothes or pajamas. If it's the night before surgery, sleep in clean sheets. General tips Use CHG only as told, and follow the instructions on the label. Use the full amount of CHG as told. This is often one bottle. Do not smoke and stay away from flames after using CHG. Your skin may feel sticky after using CHG. This is normal. The sticky feeling will go away as the CHG dries. Do not use CHG: If you have a chlorhexidine  allergy or have reacted to chlorhexidine  in the past. On open wounds or areas of skin that have broken skin, cuts, or scrapes. On babies younger than 68 months of age. Contact a health care provider if: You have questions about using CHG. Your skin gets irritated or itchy. You have a rash after using CHG. You swallow any CHG. Call your local poison control center [phone] in the [place] Your eyes itch badly, or they become very red or swollen. Your hearing changes. You have trouble seeing. If you can't reach your provider, go to an urgent care or emergency room. Do not drive yourself. Get help right away if: You have swelling or tingling in your mouth or throat. You make high-pitched whistling sounds when you breathe, most often when you breathe out (wheeze). You have trouble breathing. These symptoms may be an emergency. Call 911 right away. Do not wait to see if the symptoms will go away. Do not drive yourself to the hospital. This information is not intended to replace advice given to you by your  health care provider. Make sure you discuss any questions you have with your health care provider. Document Revised: 03/23/2023 Document Reviewed: 03/19/2022 Elsevier Patient Education  2024 [employer].

## 2024-05-05 ENCOUNTER — Emergency Department (HOSPITAL_COMMUNITY)
Admission: EM | Admit: 2024-05-05 | Discharge: 2024-05-05 | Disposition: A | Discharge status: Home / Self Care | Source: Ambulatory Visit | Attending: Emergency Medicine | Admitting: Emergency Medicine

## 2024-05-05 ENCOUNTER — Encounter (HOSPITAL_COMMUNITY)
Admission: RE | Admit: 2024-05-05 | Discharge: 2024-05-05 | Disposition: A | Discharge status: Home / Self Care | Source: Ambulatory Visit | Attending: General Surgery | Admitting: General Surgery

## 2024-05-05 ENCOUNTER — Other Ambulatory Visit: Payer: Self-pay

## 2024-05-05 ENCOUNTER — Encounter (HOSPITAL_COMMUNITY): Payer: Self-pay | Admitting: Emergency Medicine

## 2024-05-05 ENCOUNTER — Encounter (HOSPITAL_COMMUNITY): Payer: Self-pay

## 2024-05-05 VITALS — BP 95/72 | HR 89 | Resp 18 | Ht 73.0 in | Wt 170.0 lb

## 2024-05-05 DIAGNOSIS — E1165 Type 2 diabetes mellitus with hyperglycemia: Secondary | ICD-10-CM | POA: Diagnosis not present

## 2024-05-05 DIAGNOSIS — E611 Iron deficiency: Secondary | ICD-10-CM | POA: Diagnosis not present

## 2024-05-05 DIAGNOSIS — I4510 Unspecified right bundle-branch block: Secondary | ICD-10-CM | POA: Diagnosis not present

## 2024-05-05 DIAGNOSIS — E119 Type 2 diabetes mellitus without complications: Secondary | ICD-10-CM | POA: Insufficient documentation

## 2024-05-05 DIAGNOSIS — I10 Essential (primary) hypertension: Secondary | ICD-10-CM | POA: Diagnosis not present

## 2024-05-05 DIAGNOSIS — Z01818 Encounter for other preprocedural examination: Secondary | ICD-10-CM | POA: Diagnosis not present

## 2024-05-05 DIAGNOSIS — R739 Hyperglycemia, unspecified: Secondary | ICD-10-CM | POA: Diagnosis not present

## 2024-05-05 HISTORY — DX: Anemia, unspecified: D64.9

## 2024-05-05 LAB — URINALYSIS, ROUTINE W REFLEX MICROSCOPIC
Bacteria, UA: NONE SEEN
Bilirubin Urine: NEGATIVE
Glucose, UA: >=500 mg/dL — AB
Ketones, ur: NEGATIVE mg/dL
Leukocytes,Ua: NEGATIVE
Nitrite: NEGATIVE
Protein, ur: NEGATIVE mg/dL
Specific Gravity, Urine: 1.035 — ABNORMAL HIGH (ref 1.005–1.030)
pH: 6 (ref 5.0–8.0)

## 2024-05-05 LAB — CBC WITH DIFFERENTIAL/PLATELET
Abs Immature Granulocytes: 0.02 K/uL (ref 0.00–0.07)
Basophils Absolute: 0 K/uL (ref 0.0–0.1)
Basophils Relative: 1 %
Eosinophils Absolute: 0.1 K/uL (ref 0.0–0.5)
Eosinophils Relative: 1 %
HCT: 40.3 % (ref 39.0–52.0)
Hemoglobin: 13.1 g/dL (ref 13.0–17.0)
Immature Granulocytes: 0 %
Lymphocytes Relative: 22 %
Lymphs Abs: 1.6 K/uL (ref 0.7–4.0)
MCH: 28.4 pg (ref 26.0–34.0)
MCHC: 32.5 g/dL (ref 30.0–36.0)
MCV: 87.4 fL (ref 80.0–100.0)
Monocytes Absolute: 0.4 K/uL (ref 0.1–1.0)
Monocytes Relative: 5 %
Neutro Abs: 5.1 K/uL (ref 1.7–7.7)
Neutrophils Relative %: 71 %
Platelets: 144 K/uL — ABNORMAL LOW (ref 150–400)
RBC: 4.61 MIL/uL (ref 4.22–5.81)
RDW: 13.7 % (ref 11.5–15.5)
WBC: 7.2 K/uL (ref 4.0–10.5)
nRBC: 0 % (ref 0.0–0.2)

## 2024-05-05 LAB — BASIC METABOLIC PANEL WITH GFR
Anion gap: 12 (ref 5–15)
BUN: 16 mg/dL (ref 8–23)
CO2: 21 mmol/L — ABNORMAL LOW (ref 22–32)
Calcium: 9.5 mg/dL (ref 8.9–10.3)
Chloride: 97 mmol/L — ABNORMAL LOW (ref 98–111)
Creatinine, Ser: 1.04 mg/dL (ref 0.61–1.24)
GFR, Estimated: >60 mL/min (ref >60)
Glucose, Bld: 508 mg/dL (ref 70–99)
Potassium: 3.8 mmol/L (ref 3.5–5.1)
Sodium: 130 mmol/L — ABNORMAL LOW (ref 135–145)

## 2024-05-05 LAB — HEMOGLOBIN A1C
Hgb A1c MFr Bld: 13.4 % — ABNORMAL HIGH (ref 4.8–5.6)
Mean Plasma Glucose: 337.88 mg/dL

## 2024-05-05 LAB — CBG MONITORING, ED
Glucose-Capillary: 434 mg/dL — ABNORMAL HIGH (ref 70–99)
Glucose-Capillary: 285 mg/dL — ABNORMAL HIGH (ref 70–99)
Glucose-Capillary: 228 mg/dL — ABNORMAL HIGH (ref 70–99)

## 2024-05-05 MED ORDER — INSULIN ASPART 100 UNIT/ML IJ SOLN
6.0000 [IU] | Freq: Once | INTRAMUSCULAR | Status: AC
  Administered 2024-05-05: 6 [IU] via INTRAVENOUS
  Filled 2024-05-05: qty 1

## 2024-05-05 MED ORDER — LACTATED RINGERS IV BOLUS
1000.0000 mL | Freq: Once | INTRAVENOUS | Status: AC
  Administered 2024-05-05: 1000 mL via INTRAVENOUS

## 2024-05-05 MED ORDER — INSULIN ASPART 100 UNIT/ML IJ SOLN
4.0000 [IU] | Freq: Once | INTRAMUSCULAR | Status: AC
  Administered 2024-05-05: 4 [IU] via INTRAVENOUS
  Filled 2024-05-05: qty 1

## 2024-05-05 NOTE — Pre-Procedure Instructions (Signed)
 Lab calls critical glucose of 508. I called Dr Mavis and in turn called patient to have him go urgently to the ED, per Dr Mavis, to get his glucose under control. Patient verbalized understanding and urgency of this request.

## 2024-05-05 NOTE — Discharge Instructions (Signed)
 Start on the glipizide  at the previous dose.  Follow-up with your doctor on Monday.  Keep an eye on your sugars at home.

## 2024-05-05 NOTE — ED Triage Notes (Signed)
 Pt to the ED with complaints of hyperglycemia.  Pt completed pre-surgical blood work this morning and his BG was 508.  Pt advised to be seen in the ED.

## 2024-05-05 NOTE — ED Provider Notes (Signed)
 [place] EMERGENCY DEPARTMENT AT [facility] Provider Note   CSN: [id number] Arrival date & time: 05/05/24  1627     Patient presents with: Hyperglycemia   [name] [name] is a 63 y.o. male.    Hyperglycemia Patient presents with high blood sugar.  Was having preop testing for hernia pair and found her sugar 508.  Has had sugars have been elevated.  Has been running high at home.  Is on metformin  and recently on Ozempic  and had been on glipizide  recently.  Has had adjustment of medications before the surgery and because he was having some GI symptoms.  Also was lost 30 pounds.     Prior to Admission medications   Medication Sig Start Date End Date Taking? Authorizing Provider  acetaminophen  (TYLENOL ) 650 MG CR tablet Take 1,300 mg by mouth in the morning.    [provider]  atorvastatin  (LIPITOR) 20 MG tablet Take 1 tablet (20 mg total) by mouth at bedtime. 08/23/23   Cook, Jayce G, DO  CONTOUR NEXT TEST test strip USE TO TEST BLOOD SUGAR ONCE DAILY. 01/13/21   Waddell Catholic M, DO  Cyanocobalamin  (VITAMIN B-12 PO) Take 1 each by mouth in the morning. Gummies    [provider]  dutasteride  (AVODART ) 0.5 MG capsule take 1 capsule (0.5 MILLIGRAM total) by mouth daily. Patient taking differently: Take 0.5 mg by mouth at bedtime. 02/07/24   Cook, Jayce G, DO  fluticasone  (FLONASE ) 50 MCG/ACT nasal spray use 2 sprays in each nostril every day, as needed. Patient taking differently: Place 2 sprays into both nostrils daily as needed (allergies.). 01/03/24   Cook, Jayce G, DO  Iron , Ferrous Sulfate , 325 (65 Fe) MG TABS Take 325 mg by mouth every other day. 02/22/24   Cook, Jayce G, DO  LORazepam  (ATIVAN ) 1 MG tablet take 1/2-1 tablet by mouth at bedtime as needed for anxiety/insomnia. Patient taking differently: Take 1 mg by mouth at bedtime. 03/09/24   Cook, Jayce G, DO  losartan  (COZAAR ) 50 MG tablet take 1 tablet (50 MILLIGRAM total) by mouth daily. 03/09/24   Cook,  Jayce G, DO  metFORMIN  (GLUCOPHAGE ) 500 MG tablet Take 2 tablets (1,000 mg total) by mouth 2 (two) times daily with a meal. 01/25/24   Therisa Benton PARAS, NP  omeprazole  (PRILOSEC) 20 MG capsule Take 1 capsule (20 mg total) by mouth 2 (two) times daily before a meal. Patient taking differently: Take 20 mg by mouth daily before breakfast. 02/21/24   Cook, Jayce G, DO  ONETOUCH DELICA LANCETS 33G MISC  03/20/15   [provider]  tamsulosin  (FLOMAX ) 0.4 MG CAPS capsule take 2 capsules (0.8 MILLIGRAM total) by mouth at bedtime. 02/07/24   Cook, Jayce G, DO  traMADol  (ULTRAM ) 50 MG tablet Take 1 tablet (50 mg total) by mouth every 12 (twelve) hours as needed for moderate pain (pain score 4-6) or severe pain (pain score 7-10). Patient taking differently: Take 50 mg by mouth at bedtime. 01/09/24   Cook, Jayce G, DO    Allergies: Dilaudid  [hydromorphone  hcl] and Ozempic  (0.25 or 0.5 mg-dose) [semaglutide (0.25 or 0.5mg -dos)]    Review of Systems  Updated Vital Signs BP 112/80 (BP Location: Right Arm)   Pulse 64   Temp 98.4 F (36.9 C) (Oral)   Resp 16   Ht 6' 1 (1.854 m)   Wt 77.1 kg   SpO2 98%   BMI 22.43 kg/m   Physical Exam Vitals and nursing note reviewed.  Cardiovascular:  Rate and Rhythm: Normal rate.  Pulmonary:     Breath sounds: No wheezing.  Abdominal:     Tenderness: There is no abdominal tenderness.  Musculoskeletal:     Cervical back: Neck supple.  Neurological:     Mental Status: He is alert.     (all labs ordered are listed, but only abnormal results are displayed) Labs Reviewed  URINALYSIS, ROUTINE W REFLEX MICROSCOPIC - Abnormal; Notable for the following components:      Result Value   Specific Gravity, Urine 1.035 (*)    Glucose, UA >=500 (*)    Hgb urine dipstick SMALL (*)    All other components within normal limits  CBG MONITORING, ED - Abnormal; Notable for the following components:   Glucose-Capillary 434 (*)    All other components within  normal limits  CBG MONITORING, ED - Abnormal; Notable for the following components:   Glucose-Capillary 285 (*)    All other components within normal limits  CBG MONITORING, ED - Abnormal; Notable for the following components:   Glucose-Capillary 228 (*)    All other components within normal limits  CBG MONITORING, ED    EKG: None  Radiology: No results found.   Procedures   Medications Ordered in the ED  lactated ringers  bolus 1,000 mL (0 mLs Intravenous Stopped 05/05/24 1821)  insulin  aspart (novoLOG ) injection 6 Units (6 Units Intravenous Given 05/05/24 1717)  lactated ringers  bolus 1,000 mL (0 mLs Intravenous Stopped 05/05/24 2018)  insulin  aspart (novoLOG ) injection 4 Units (4 Units Intravenous Given 05/05/24 1918)                                    Medical Decision Making Amount and/or Complexity of Data Reviewed Labs: ordered.  Risk Prescription drug management.   Patient hyperglycemic.  Not DKA.  Has had adjustment of medicines.  Given fluid bolus twice and insulin  twice.  Has come down.  I think for now is reasonable to start back on the glipizide  that he had previously been on.  Can follow-up with PCP on Monday and hopefully be stable enough he can have his surgery done on Wednesday.  Will discharge home.     Final diagnoses:  Hyperglycemia    ED Discharge Orders     None          Patsey Lot, MD 05/05/24 2109

## 2024-05-08 ENCOUNTER — Telehealth: Payer: Self-pay | Admitting: *Deleted

## 2024-05-08 NOTE — Telephone Encounter (Signed)
 Patient left a message stating that he was to have a hernia surgery on this Wednesday. His blood sugar this morning 265, he has been off of his blood sugar medications, he went to the ED, lab work was done and something was over 500, he said. The doctor in the [facility]) advised him to start taking his diabetic medication. He would like for Whitney to share with him how to restart it with the surgery coming up this Wednesday .

## 2024-05-08 NOTE — Telephone Encounter (Signed)
 Have him start Glipizide  5 mg po twice daily for now.  He will skip his meds the morning of surgery.  He really needs to watch his intake over the next several days to prevent high sugars.  Avoid sweetened beverages (NO sodas, juices, sweet tea--even diet products).  Avoid fried foods.  Avoid desserts.

## 2024-05-08 NOTE — Telephone Encounter (Signed)
 Patient was called and made aware.

## 2024-05-10 ENCOUNTER — Other Ambulatory Visit: Payer: Self-pay

## 2024-05-10 ENCOUNTER — Ambulatory Visit (HOSPITAL_COMMUNITY): Payer: Self-pay | Admitting: Anesthesiology

## 2024-05-10 ENCOUNTER — Encounter (HOSPITAL_COMMUNITY): Payer: Self-pay | Admitting: General Surgery

## 2024-05-10 ENCOUNTER — Encounter (HOSPITAL_COMMUNITY)
Admission: RE | Disposition: A | Discharge status: Home / Self Care | Payer: Self-pay | Source: Home / Self Care | Attending: General Surgery

## 2024-05-10 ENCOUNTER — Ambulatory Visit (HOSPITAL_COMMUNITY)
Admission: RE | Admit: 2024-05-10 | Discharge: 2024-05-10 | Disposition: A | Discharge status: Home / Self Care | Attending: General Surgery | Admitting: General Surgery

## 2024-05-10 DIAGNOSIS — Z7984 Long term (current) use of oral hypoglycemic drugs: Secondary | ICD-10-CM | POA: Insufficient documentation

## 2024-05-10 DIAGNOSIS — E785 Hyperlipidemia, unspecified: Secondary | ICD-10-CM | POA: Insufficient documentation

## 2024-05-10 DIAGNOSIS — K760 Fatty (change of) liver, not elsewhere classified: Secondary | ICD-10-CM | POA: Insufficient documentation

## 2024-05-10 DIAGNOSIS — K4090 Unilateral inguinal hernia, without obstruction or gangrene, not specified as recurrent: Secondary | ICD-10-CM | POA: Insufficient documentation

## 2024-05-10 DIAGNOSIS — E119 Type 2 diabetes mellitus without complications: Secondary | ICD-10-CM | POA: Insufficient documentation

## 2024-05-10 DIAGNOSIS — Z7985 Long-term (current) use of injectable non-insulin antidiabetic drugs: Secondary | ICD-10-CM | POA: Insufficient documentation

## 2024-05-10 DIAGNOSIS — I10 Essential (primary) hypertension: Secondary | ICD-10-CM | POA: Insufficient documentation

## 2024-05-10 DIAGNOSIS — D649 Anemia, unspecified: Secondary | ICD-10-CM | POA: Diagnosis not present

## 2024-05-10 DIAGNOSIS — Z79899 Other long term (current) drug therapy: Secondary | ICD-10-CM | POA: Insufficient documentation

## 2024-05-10 DIAGNOSIS — R0602 Shortness of breath: Secondary | ICD-10-CM | POA: Diagnosis not present

## 2024-05-10 DIAGNOSIS — K219 Gastro-esophageal reflux disease without esophagitis: Secondary | ICD-10-CM | POA: Diagnosis not present

## 2024-05-10 DIAGNOSIS — F419 Anxiety disorder, unspecified: Secondary | ICD-10-CM | POA: Insufficient documentation

## 2024-05-10 HISTORY — PX: XI ROBOTIC ASSISTED INGUINAL HERNIA REPAIR WITH MESH: SHX6706

## 2024-05-10 LAB — GLUCOSE, CAPILLARY
Glucose-Capillary: 198 mg/dL — ABNORMAL HIGH (ref 70–99)
Glucose-Capillary: 205 mg/dL — ABNORMAL HIGH (ref 70–99)

## 2024-05-10 SURGERY — REPAIR, HERNIA, INGUINAL, ROBOT-ASSISTED, LAPAROSCOPIC, USING MESH
Anesthesia: General | Site: Abdomen | Laterality: Right

## 2024-05-10 MED ORDER — SUGAMMADEX SODIUM 200 MG/2ML IV SOLN
INTRAVENOUS | Status: DC | PRN
  Administered 2024-05-10: 200 mg via INTRAVENOUS

## 2024-05-10 MED ORDER — MIDAZOLAM HCL 2 MG/2ML IJ SOLN
INTRAMUSCULAR | Status: DC | PRN
  Administered 2024-05-10: 2 mg via INTRAVENOUS

## 2024-05-10 MED ORDER — ONDANSETRON HCL 4 MG/2ML IJ SOLN: 4.0000 mg | Freq: Once | INTRAMUSCULAR | Status: DC | PRN

## 2024-05-10 MED ORDER — LACTATED RINGERS IV SOLN
INTRAVENOUS | Status: DC | PRN
Start: 2024-05-10 — End: 2024-05-10

## 2024-05-10 MED ORDER — EPHEDRINE SULFATE-NACL 50-0.9 MG/10ML-% IV SOSY
PREFILLED_SYRINGE | INTRAVENOUS | Status: DC | PRN
  Administered 2024-05-10 (×2): 5 mg via INTRAVENOUS

## 2024-05-10 MED ORDER — ORAL CARE MOUTH RINSE: 15.0000 mL | Freq: Once | OROMUCOSAL | Status: AC

## 2024-05-10 MED ORDER — ROCURONIUM BROMIDE 10 MG/ML (PF) SYRINGE
PREFILLED_SYRINGE | INTRAVENOUS | Status: DC | PRN
  Administered 2024-05-10: 10 mg via INTRAVENOUS
  Administered 2024-05-10: 70 mg via INTRAVENOUS

## 2024-05-10 MED ORDER — ONDANSETRON HCL 4 MG/2ML IJ SOLN
INTRAMUSCULAR | Status: AC
  Filled 2024-05-10: qty 2

## 2024-05-10 MED ORDER — KETOROLAC TROMETHAMINE 30 MG/ML IJ SOLN
30.0000 mg | Freq: Once | INTRAMUSCULAR | Status: AC
  Administered 2024-05-10: 30 mg via INTRAVENOUS
  Filled 2024-05-10: qty 1

## 2024-05-10 MED ORDER — FENTANYL CITRATE (PF) 250 MCG/5ML IJ SOLN
INTRAMUSCULAR | Status: AC
  Filled 2024-05-10: qty 5

## 2024-05-10 MED ORDER — PROPOFOL 10 MG/ML IV BOLUS
INTRAVENOUS | Status: AC
  Filled 2024-05-10: qty 20

## 2024-05-10 MED ORDER — SUGAMMADEX SODIUM 200 MG/2ML IV SOLN
INTRAVENOUS | Status: AC
Start: 2024-05-10 — End: 2024-05-10
  Filled 2024-05-10: qty 2

## 2024-05-10 MED ORDER — CEFAZOLIN SODIUM-DEXTROSE 2-4 GM/100ML-% IV SOLN
2.0000 g | INTRAVENOUS | Status: AC
  Administered 2024-05-10: 2 g via INTRAVENOUS
  Filled 2024-05-10: qty 100

## 2024-05-10 MED ORDER — CHLORHEXIDINE GLUCONATE CLOTH 2 % EX PADS: 6.0000 | MEDICATED_PAD | Freq: Once | CUTANEOUS | Status: DC

## 2024-05-10 MED ORDER — ONDANSETRON HCL 4 MG/2ML IJ SOLN
INTRAMUSCULAR | Status: DC | PRN
  Administered 2024-05-10: 4 mg via INTRAVENOUS

## 2024-05-10 MED ORDER — BUPIVACAINE HCL (PF) 0.5 % IJ SOLN
INTRAMUSCULAR | Status: AC
  Filled 2024-05-10: qty 30

## 2024-05-10 MED ORDER — MIDAZOLAM HCL 2 MG/2ML IJ SOLN
INTRAMUSCULAR | Status: AC
  Filled 2024-05-10: qty 2

## 2024-05-10 MED ORDER — EPHEDRINE 5 MG/ML INJ
INTRAVENOUS | Status: AC
  Filled 2024-05-10: qty 5

## 2024-05-10 MED ORDER — LIDOCAINE 2% (20 MG/ML) 5 ML SYRINGE
INTRAMUSCULAR | Status: DC | PRN
  Administered 2024-05-10: 60 mg via INTRAVENOUS

## 2024-05-10 MED ORDER — PROPOFOL 10 MG/ML IV BOLUS
INTRAVENOUS | Status: DC | PRN
  Administered 2024-05-10: 150 mg via INTRAVENOUS

## 2024-05-10 MED ORDER — LACTATED RINGERS IV SOLN: INTRAVENOUS | Status: DC

## 2024-05-10 MED ORDER — SEVOFLURANE IN SOLN
RESPIRATORY_TRACT | Status: AC
  Filled 2024-05-10: qty 250

## 2024-05-10 MED ORDER — PHENYLEPHRINE 80 MCG/ML (10ML) SYRINGE FOR IV PUSH (FOR BLOOD PRESSURE SUPPORT)
PREFILLED_SYRINGE | INTRAVENOUS | Status: DC | PRN
  Administered 2024-05-10 (×2): 160 ug via INTRAVENOUS
  Administered 2024-05-10: 80 ug via INTRAVENOUS
  Administered 2024-05-10 (×2): 160 ug via INTRAVENOUS

## 2024-05-10 MED ORDER — FENTANYL CITRATE PF 50 MCG/ML IJ SOSY
25.0000 ug | PREFILLED_SYRINGE | INTRAMUSCULAR | Status: DC | PRN
  Administered 2024-05-10: 50 ug via INTRAVENOUS
  Filled 2024-05-10: qty 1

## 2024-05-10 MED ORDER — BUPIVACAINE HCL (PF) 0.5 % IJ SOLN
INTRAMUSCULAR | Status: DC | PRN
  Administered 2024-05-10: 30 mL

## 2024-05-10 MED ORDER — OXYCODONE HCL 5 MG PO TABS
5.0000 mg | ORAL_TABLET | Freq: Once | ORAL | Status: AC | PRN
  Administered 2024-05-10: 5 mg via ORAL
  Filled 2024-05-10: qty 1

## 2024-05-10 MED ORDER — CHLORHEXIDINE GLUCONATE 0.12 % MT SOLN
15.0000 mL | Freq: Once | OROMUCOSAL | Status: AC
  Administered 2024-05-10: 15 mL via OROMUCOSAL
  Filled 2024-05-10: qty 15

## 2024-05-10 MED ORDER — PHENYLEPHRINE 80 MCG/ML (10ML) SYRINGE FOR IV PUSH (FOR BLOOD PRESSURE SUPPORT)
PREFILLED_SYRINGE | INTRAVENOUS | Status: AC
  Filled 2024-05-10: qty 10

## 2024-05-10 MED ORDER — STERILE WATER FOR IRRIGATION IR SOLN
Status: DC | PRN
  Administered 2024-05-10: 1000 mL

## 2024-05-10 MED ORDER — CHLORHEXIDINE GLUCONATE CLOTH 2 % EX PADS
6.0000 | MEDICATED_PAD | Freq: Once | CUTANEOUS | Status: AC
  Administered 2024-05-10: 6 via TOPICAL

## 2024-05-10 MED ORDER — FENTANYL CITRATE (PF) 100 MCG/2ML IJ SOLN
INTRAMUSCULAR | Status: DC | PRN
  Administered 2024-05-10 (×2): 50 ug via INTRAVENOUS
  Administered 2024-05-10: 100 ug via INTRAVENOUS
  Administered 2024-05-10: 50 ug via INTRAVENOUS

## 2024-05-10 MED ORDER — OXYCODONE HCL 5 MG/5ML PO SOLN: 5.0000 mg | Freq: Once | ORAL | Status: AC | PRN

## 2024-05-10 SURGICAL SUPPLY — 40 items
CHLORAPREP W/TINT 26 (MISCELLANEOUS) ×1 IMPLANT
COVER LIGHT HANDLE STERIS (MISCELLANEOUS) ×1 IMPLANT
COVER MAYO STAND XLG (MISCELLANEOUS) ×1 IMPLANT
COVER TIP SHEARS 8 DVNC (MISCELLANEOUS) ×1 IMPLANT
DERMABOND ADVANCED .7 DNX12 (GAUZE/BANDAGES/DRESSINGS) ×1 IMPLANT
DRAPE ARM DVNC X/XI (DISPOSABLE) ×3 IMPLANT
DRAPE COLUMN DVNC XI (DISPOSABLE) ×1 IMPLANT
DRIVER NDL MEGA SUTCUT DVNCXI (INSTRUMENTS) ×1 IMPLANT
DRIVER NDLE MEGA SUTCUT DVNCXI (INSTRUMENTS) ×1 IMPLANT
ELECTRODE REM PT RTRN 9FT ADLT (ELECTROSURGICAL) ×1 IMPLANT
FORCEPS BPLR R/ABLATION 8 DVNC (INSTRUMENTS) ×1 IMPLANT
GAUZE SPONGE 4X4 12PLY STRL (GAUZE/BANDAGES/DRESSINGS) ×1 IMPLANT
GLOVE BIO SURGEON STRL SZ7 (GLOVE) IMPLANT
GLOVE BIOGEL PI IND STRL 7.0 (GLOVE) ×4 IMPLANT
GLOVE SURG SS PI 6.5 STRL IVOR (GLOVE) IMPLANT
GLOVE SURG SS PI 7.5 STRL IVOR (GLOVE) ×2 IMPLANT
GOWN STRL REUS W/TWL LRG LVL3 (GOWN DISPOSABLE) ×2 IMPLANT
KIT PINK PAD W/HEAD ARM REST (MISCELLANEOUS) ×1 IMPLANT
KIT TURNOVER KIT A (KITS) ×1 IMPLANT
MANIFOLD NEPTUNE II (INSTRUMENTS) ×1 IMPLANT
MESH 3DMAX MID 5X7 RT XLRG (Mesh General) IMPLANT
NDL HYPO 21X1.5 SAFETY (NEEDLE) ×1 IMPLANT
NDL INSUFFLATION 14GA 120MM (NEEDLE) ×1 IMPLANT
NEEDLE HYPO 21X1.5 SAFETY (NEEDLE) ×1 IMPLANT
NEEDLE INSUFFLATION 14GA 120MM (NEEDLE) ×1 IMPLANT
OBTURATOR OPTICALSTD 8 DVNC (TROCAR) ×1 IMPLANT
PACK LAP CHOLE LZT030E (CUSTOM PROCEDURE TRAY) ×1 IMPLANT
PENCIL HANDSWITCHING (ELECTRODE) ×1 IMPLANT
SCISSORS MNPLR CVD DVNC XI (INSTRUMENTS) ×1 IMPLANT
SEAL UNIV 5-12 XI (MISCELLANEOUS) ×3 IMPLANT
SET BASIN LINEN APH (SET/KITS/TRAYS/PACK) ×1 IMPLANT
SET TUBE DA VINCI INSUFFLATOR (TUBING) IMPLANT
SUT MNCRL AB 4-0 PS2 18 (SUTURE) ×2 IMPLANT
SUT STRATA 3-0 SH (SUTURE) ×4 IMPLANT
SUT VIC AB 2-0 SH 27X BRD (SUTURE) ×1 IMPLANT
SYR 30ML LL (SYRINGE) ×1 IMPLANT
TAPE TRANSPORE STRL 2 31045 (GAUZE/BANDAGES/DRESSINGS) ×1 IMPLANT
TRAY FOL W/BAG SLVR 16FR STRL (SET/KITS/TRAYS/PACK) ×1 IMPLANT
WATER STERILE IRR 500ML POUR (IV SOLUTION) ×1 IMPLANT
YANKAUER SUCT BULB TIP 10FT TU (MISCELLANEOUS) IMPLANT

## 2024-05-10 NOTE — Anesthesia Procedure Notes (Signed)
 Procedure Name: Intubation Date/Time: 05/10/2024 10:53 AM  Performed by: Elaine Delon CROME, CRNAPre-anesthesia Checklist: Patient identified, Emergency Drugs available, Suction available and Patient being monitored Patient Re-evaluated:Patient Re-evaluated prior to induction Oxygen Delivery Method: Circle system utilized Preoxygenation: Pre-oxygenation with 100% oxygen Induction Type: IV induction Ventilation: Mask ventilation without difficulty Laryngoscope Size: Mac and 4 Grade View: Grade I Tube type: Oral Tube size: 7.5 mm Number of attempts: 1 Airway Equipment and Method: Stylet Placement Confirmation: ETT inserted through vocal cords under direct vision, positive ETCO2 and breath sounds checked- equal and bilateral Secured at: 21 cm Tube secured with: Tape Dental Injury: Teeth and Oropharynx as per pre-operative assessment

## 2024-05-10 NOTE — Anesthesia Preprocedure Evaluation (Signed)
 Anesthesia Evaluation  Patient identified by MRN, date of birth, ID band Patient awake    Reviewed: Allergy & Precautions, H&P , NPO status , Patient's Chart, lab work & pertinent test results, reviewed documented beta blocker date and time   Airway Mallampati: II  TM Distance: >3 FB Neck ROM: full    Dental no notable dental hx.    Pulmonary shortness of breath   Pulmonary exam normal breath sounds clear to auscultation       Cardiovascular Exercise Tolerance: Good hypertension,  Rhythm:regular Rate:Normal     Neuro/Psych   Anxiety     negative neurological ROS  negative psych ROS   GI/Hepatic Neg liver ROS,GERD  ,,  Endo/Other  diabetes    Renal/GU negative Renal ROS  negative genitourinary   Musculoskeletal   Abdominal   Peds  Hematology  (+) Blood dyscrasia, anemia   Anesthesia Other Findings   Reproductive/Obstetrics negative OB ROS                              Anesthesia Physical Anesthesia Plan  ASA: 3  Anesthesia Plan: General and General ETT   Post-op Pain Management:    Induction:   PONV Risk Score and Plan: Ondansetron   Airway Management Planned:   Additional Equipment:   Intra-op Plan:   Post-operative Plan:   Informed Consent: I have reviewed the patients History and Physical, chart, labs and discussed the procedure including the risks, benefits and alternatives for the proposed anesthesia with the patient or authorized representative who has indicated his/her understanding and acceptance.     Dental Advisory Given  Plan Discussed with: CRNA  Anesthesia Plan Comments:         Anesthesia Quick Evaluation

## 2024-05-10 NOTE — Interval H&P Note (Signed)
 History and Physical Interval Note:  05/10/2024 7:57 AM  [name] [name]  has presented today for surgery, with the diagnosis of HERINA, INGUINAL, RIGHT.  The various methods of treatment have been discussed with the patient and family. After consideration of risks, benefits and other options for treatment, the patient has consented to  Procedure(s): REPAIR, HERNIA, INGUINAL, ROBOT-ASSISTED, LAPAROSCOPIC, USING MESH (Right) as a surgical intervention.  The patient's history has been reviewed, patient examined, no change in status, stable for surgery.  I have reviewed the patient's chart and labs.  Questions were answered to the patient's satisfaction.     [name]

## 2024-05-10 NOTE — Transfer of Care (Signed)
 Immediate Anesthesia Transfer of Care Note  Patient: [name] [name]  Procedure(s) Performed: REPAIR, HERNIA, INGUINAL, ROBOT-ASSISTED, LAPAROSCOPIC, USING MESH (Right: Abdomen)  Patient Location: PACU  Anesthesia Type:General  Level of Consciousness: drowsy and pateint uncooperative  Airway & Oxygen Therapy: Patient Spontanous Breathing and Patient connected to nasal cannula oxygen  Post-op Assessment: Report given to RN and Post -op Vital signs reviewed and stable  Post vital signs: Reviewed and stable  Last Vitals:  Vitals Value Taken Time  BP 111/87 05/10/24 12:33  Temp 97.6 05/10/24   12:33  Pulse 71 05/10/24 12:41  Resp 17 05/10/24 12:41  SpO2 99 % 05/10/24 12:41  Vitals shown include unfiled device data.  Last Pain:  Vitals:   05/10/24 0639  TempSrc: Oral  PainSc: 0-No pain         Complications: No notable events documented.

## 2024-05-10 NOTE — Op Note (Signed)
 Patient:  [name] [name]  DOB:  Aug 31, 1961  MRN:  [id number]   Preop Diagnosis: Right inguinal hernia  Postop Diagnosis: Same  Procedure: Robotic assisted laparoscopic right inguinal herniorrhaphy with mesh  Surgeon: [name], MD  Anes: General  Indications: Patient is a 63 year old white male who presents with a symptomatic right inguinal hernia.  The risks and benefits of the procedure including bleeding, infection, mesh use, the possibility of recurrence of the hernia were fully explained to the patient, who gave informed consent.  Procedure note: The patient was placed in the supine position.  After induction of general endotracheal anesthesia, the abdomen and pelvis were prepped and draped using the usual sterile technique with Betadine  and ChloraPrep.  Surgical site confirmation was performed.  An incision was made in the left upper quadrant at Palmer's point.  A Veress needle was introduced into the abdominal cavity and confirmation of placement was done using the saline drop test.  The abdomen was then insufflated to 15 mmHg pressure.  An 8 mm trocar was introduced into the abdominal cavity under direct visualization without difficulty.  Additional 8 mm trocars were placed in the upper midline and right upper quadrant regions.  The patient was placed in Trendelenburg position.  The robot was then docked and targeted.  The patient had an indirect hernia with small bowel contents present.  These were partially reduced but were adhesed to the hernia sac.  A peritoneal flap was then formed lateral to medial down to an area below Cooper's ligament.  This was carried out laterally at the same level.  I did go across the midline at Cooper's ligament.  The hernia sac was then freed away from the spermatic cord.  Due to dense adhesions, there was a hole in the flap.  Once this was done, an extra-large Bard 3D max mesh was inserted and secured to Cooper's ligament using a 2-0 Vicryl interrupted  suture.  Another suture was placed anteriorly above the hernia defect to the anterior abdominal wall using a 2-0 Vicryl fixation suture.  The peritoneal flap was closed using a 3-0 Stratafix running suture.  The peritoneal defect was closed using a 3-0 Stratafix running suture.  Air was then evacuated from the abdominal cavity and good approximation of the peritoneal flap to the mesh was found.  The robot was undocked and all remaining air was evacuated from the abdominal cavity prior to the removal of the trocars.  All wounds were irrigated with normal saline.  All wounds were injected with 0.5% Sensorcaine .  The right inguinal region was injected with 0.5% Sensorcaine .  All incisions were closed using a 4-0 Monocryl subcuticular suture.  Dermabond was applied.  All tape and needle counts were correct at the end of the procedure.  Patient was extubated in the operating room and transferred to PACU in stable condition.  Complications: None  EBL: Minimal  Specimen: None

## 2024-05-11 ENCOUNTER — Encounter (HOSPITAL_COMMUNITY): Payer: Self-pay | Admitting: General Surgery

## 2024-05-15 NOTE — Anesthesia Postprocedure Evaluation (Signed)
 Anesthesia Post Note  Patient: [name]  Procedure(s) Performed: REPAIR, HERNIA, INGUINAL, ROBOT-ASSISTED, LAPAROSCOPIC, USING MESH (Right: Abdomen)  Patient location during evaluation: Phase II Anesthesia Type: General Level of consciousness: awake Pain management: pain level controlled Vital Signs Assessment: post-procedure vital signs reviewed and stable Respiratory status: spontaneous breathing and respiratory function stable Cardiovascular status: blood pressure returned to baseline and stable Postop Assessment: no headache and no apparent nausea or vomiting Anesthetic complications: no Comments: Late entry   No notable events documented.   Last Vitals:  Vitals:   05/10/24 1330 05/10/24 1339  BP: 113/82 107/84  Pulse: 70 65  Resp: 15 15  Temp:  36.4 C  SpO2: 94% 98%    Last Pain:  Vitals:   05/11/24 1416  TempSrc:   PainSc: 1                  Yvonna JINNY Bosworth

## 2024-05-18 ENCOUNTER — Encounter: Payer: Self-pay | Admitting: General Surgery

## 2024-05-18 ENCOUNTER — Ambulatory Visit (INDEPENDENT_AMBULATORY_CARE_PROVIDER_SITE_OTHER): Admitting: General Surgery

## 2024-05-18 VITALS — BP 97/68 | HR 70 | Temp 97.8°F | Resp 12 | Ht 73.0 in

## 2024-05-18 DIAGNOSIS — Z09 Encounter for follow-up examination after completed treatment for conditions other than malignant neoplasm: Secondary | ICD-10-CM

## 2024-05-18 NOTE — Progress Notes (Signed)
 Subjective:     [name] [name]  Patient here for postoperative visit, status post robotic assisted laparoscopic right inguinal herniorrhaphy with mesh.  He is doing very well.  He has no significant abdominal pain. Objective:    BP 97/68   Pulse 70   Temp 97.8 F (36.6 C) (Oral)   Resp 12   Ht 6' 1 (1.854 m)   SpO2 98%   BMI 22.43 kg/m   General:  alert, cooperative, and no distress  Abdomen soft, incisions healing well.  Minimal swelling in the right groin region down the spermatic cord.  No hernia or hematoma present.     Assessment:    Doing well postoperatively.    Plan:   May gradually resume normal activity.  Avoid heavy lifting over the next few weeks.  Follow-up here as needed.

## 2024-05-30 ENCOUNTER — Encounter: Payer: Self-pay | Admitting: Nurse Practitioner

## 2024-05-30 ENCOUNTER — Ambulatory Visit: Admitting: Nurse Practitioner

## 2024-05-30 VITALS — BP 104/72 | HR 91 | Ht 73.0 in | Wt 181.6 lb

## 2024-05-30 DIAGNOSIS — E119 Type 2 diabetes mellitus without complications: Secondary | ICD-10-CM

## 2024-05-30 DIAGNOSIS — I10 Essential (primary) hypertension: Secondary | ICD-10-CM | POA: Diagnosis not present

## 2024-05-30 DIAGNOSIS — E782 Mixed hyperlipidemia: Secondary | ICD-10-CM

## 2024-05-30 DIAGNOSIS — Z7984 Long term (current) use of oral hypoglycemic drugs: Secondary | ICD-10-CM | POA: Diagnosis not present

## 2024-05-30 MED ORDER — GLIPIZIDE ER 5 MG PO TB24: 5.0000 mg | ORAL_TABLET | Freq: Every day | ORAL | 3 refills | Status: AC

## 2024-05-30 MED ORDER — METFORMIN HCL 500 MG PO TABS: 1000.0000 mg | ORAL_TABLET | Freq: Two times a day (BID) | ORAL | 3 refills | Status: AC

## 2024-05-30 NOTE — Progress Notes (Signed)
 Endocrinology Follow Up Note       05/30/2024, 9:42 AM   Subjective:    [name], male    DOB: April 27, 1961.  [name] [name] is being seen in follow up after being seen in consultation for management of currently uncontrolled symptomatic diabetes requested by  Cook, Jayce G, DO.   Past Medical History:  Diagnosis Date   Anemia    Anxiety    Arthritis    Diabetes mellitus without complication (HCC)    Diabetes mellitus, type II (HCC)    Diverticulosis    pt unaware   ED (erectile dysfunction)    Fatty liver    GERD (gastroesophageal reflux disease)    History of COVID-19 07/2019   History of left inguinal hernia    History of retinal detachment    Right   Hyperlipidemia    Hypertension    Insomnia    Low ferritin level    Numbness and tingling of both feet    Shortness of breath dyspnea    since having COVID 07/2019 with exertions    Past Surgical History:  Procedure Laterality Date   BIOPSY  04/21/2022   Procedure: BIOPSY;  Surgeon: Cindie Carlin POUR, DO;  [facility]: [facility];  Service: Endoscopy;;   CHOLECYSTECTOMY     COLONOSCOPY N/A 11/30/2014   Procedure: COLONOSCOPY;  Surgeon: Lamar CHRISTELLA Hollingshead, MD;  Location: [facility];  Service: Endoscopy;  Laterality: N/A;  7:30 Am   COLONOSCOPY WITH PROPOFOL  N/A 04/21/2022   Procedure: COLONOSCOPY WITH PROPOFOL ;  Surgeon: Cindie Carlin POUR, DO;  Location: [facility];  Service: Endoscopy;  Laterality: N/A;  9:00am   ESOPHAGEAL DILATION     HERNIA REPAIR Left    1981   POLYPECTOMY  04/21/2022   Procedure: POLYPECTOMY;  Surgeon: Cindie Carlin POUR, DO;  Location: [facility];  Service: Endoscopy;;   RETINAL DETACHMENT SURGERY Right    TOTAL KNEE ARTHROPLASTY Left 12/11/2019   Procedure: TOTAL KNEE ARTHROPLASTY;  Surgeon: Melodi Lerner, MD;  Location: [facility];  Service: Orthopedics;  Laterality: Left;    XI ROBOTIC ASSISTED  INGUINAL HERNIA REPAIR WITH MESH Right 05/10/2024   Procedure: REPAIR, HERNIA, INGUINAL, ROBOT-ASSISTED, LAPAROSCOPIC, USING MESH;  Surgeon: Mavis Anes, MD;  Location: [facility];  Service: General;  Laterality: Right;    Social History   Socioeconomic History   Marital status: Married    Spouse name: Not on file   Number of children: Not on file   Years of education: Not on file   Highest education level: 12th grade  Occupational History   Not on file  Tobacco Use   Smoking status: Never    Passive exposure: Never   Smokeless tobacco: Never  Vaping Use   Vaping status: Never Used  Substance and Sexual Activity   Alcohol use: No   Drug use: No   Sexual activity: Never  Other Topics Concern   Not on file  Social History Narrative   Not on file   Social Drivers of Health   Financial Resource Strain: Low Risk  (02/17/2023)   Overall Financial Resource Strain (CARDIA)    Difficulty of Paying Living  Expenses: Not very hard  Food Insecurity: No Food Insecurity (02/17/2023)   Hunger Vital Sign    Worried About Running Out of Food in the Last Year: Never true    Ran Out of Food in the Last Year: Never true  Transportation Needs: No Transportation Needs (02/17/2023)   PRAPARE - Administrator, Civil Service (Medical): No    Lack of Transportation (Non-Medical): No  Physical Activity: Insufficiently Active (02/17/2023)   Exercise Vital Sign    Days of Exercise per Week: 3 days    Minutes of Exercise per Session: 30 min  Stress: No Stress Concern Present (02/17/2023)   [employer] of Occupational Health - Occupational Stress Questionnaire    Feeling of Stress : Not at all  Social Connections: Socially Integrated (02/17/2023)   Social Connection and Isolation Panel    Frequency of Communication with Friends and Family: More than three times a week    Frequency of Social Gatherings with Friends and Family: More than three times a week    Attends Religious Services:  More than 4 times per year    Active Member of [employer] or Organizations: Yes    Attends Engineer, structural: More than 4 times per year    Marital Status: Married    Family History  Problem Relation Age of Onset   Diabetes Mother    Neuropathy Mother     Outpatient Encounter Medications as of 05/30/2024  Medication Sig   acetaminophen  (TYLENOL ) 650 MG CR tablet Take 1,300 mg by mouth in the morning.   atorvastatin  (LIPITOR) 20 MG tablet Take 1 tablet (20 mg total) by mouth at bedtime.   CONTOUR NEXT TEST test strip USE TO TEST BLOOD SUGAR ONCE DAILY.   Cyanocobalamin  (VITAMIN B-12 PO) Take 1 each by mouth in the morning. Gummies   dutasteride  (AVODART ) 0.5 MG capsule take 1 capsule (0.5 MILLIGRAM total) by mouth daily. (Patient taking differently: Take 0.5 mg by mouth at bedtime.)   fluticasone  (FLONASE ) 50 MCG/ACT nasal spray use 2 sprays in each nostril every day, as needed. (Patient taking differently: Place 2 sprays into both nostrils daily as needed (allergies.).)   glipiZIDE  (GLUCOTROL  XL) 5 MG 24 hr tablet Take 1 tablet (5 mg total) by mouth daily with breakfast.   Iron , Ferrous Sulfate , 325 (65 Fe) MG TABS Take 325 mg by mouth every other day.   LORazepam  (ATIVAN ) 1 MG tablet take 1/2-1 tablet by mouth at bedtime as needed for anxiety/insomnia. (Patient taking differently: Take 1 mg by mouth at bedtime.)   losartan  (COZAAR ) 50 MG tablet take 1 tablet (50 MILLIGRAM total) by mouth daily.   omeprazole  (PRILOSEC) 20 MG capsule Take 1 capsule (20 mg total) by mouth 2 (two) times daily before a meal. (Patient taking differently: Take 20 mg by mouth daily before breakfast.)   ONETOUCH DELICA LANCETS 33G MISC    tamsulosin  (FLOMAX ) 0.4 MG CAPS capsule take 2 capsules (0.8 MILLIGRAM total) by mouth at bedtime.   traMADol  (ULTRAM ) 50 MG tablet Take 1 tablet (50 mg total) by mouth every 12 (twelve) hours as needed for moderate pain (pain score 4-6) or severe pain (pain score 7-10).  (Patient taking differently: Take 50 mg by mouth at bedtime.)   [DISCONTINUED] metFORMIN  (GLUCOPHAGE ) 500 MG tablet Take 2 tablets (1,000 mg total) by mouth 2 (two) times daily with a meal.   metFORMIN  (GLUCOPHAGE ) 500 MG tablet Take 2 tablets (1,000 mg total) by mouth 2 (two) times daily  with a meal.   No facility-administered encounter medications on file as of 05/30/2024.    ALLERGIES: Allergies  Allergen Reactions   Dilaudid  [Hydromorphone  Hcl] Other (See Comments)    Pt stated he passed out when he took it   Ozempic  (0.25 Or 0.5 Mg-Dose) [Semaglutide (0.25 Or 0.5mg -Dos)] Diarrhea and Other (See Comments)    Abdominal pain    VACCINATION STATUS: Immunization History  Administered Date(s) Administered   Influenza, Seasonal, Injecte, Preservative Fre 06/19/2016, 08/23/2023   Influenza,inj,Quad PF,6+ Mos 06/17/2017, 06/20/2018, 07/12/2019, 07/19/2020, 08/19/2021, 08/20/2022    Diabetes He presents for his follow-up diabetic visit. He has type 2 diabetes mellitus. Onset time: Diagnosed at approx age of 21. His disease course has been fluctuating. There are no hypoglycemic associated symptoms. Associated symptoms include blurred vision and visual change. Pertinent negatives for diabetes include no fatigue, no foot paresthesias, no polydipsia and no weight loss. There are no hypoglycemic complications. Symptoms are stable. Diabetic complications include impotence, nephropathy and peripheral neuropathy. Risk factors for coronary artery disease include diabetes mellitus, dyslipidemia, family history, male sex and hypertension. Current diabetic treatment includes oral agent (dual therapy). He is compliant with treatment most of the time. His weight is fluctuating minimally. He is following a generally healthy diet. When asked about meal planning, he reported none. He has not had a previous visit with a dietitian. He participates in exercise intermittently. His home blood glucose trend is fluctuating  dramatically. His overall blood glucose range is >200 mg/dl. (He presents today with his meter and logs showing fluctuating glycemic profile with above target readings overall.   His most recent A1c on 8/15 was 13.4%.  This was before his hernia surgery.  Analysis of his meter shows 14-day average of 208 with 9 readings.  He called between visits for GI symptoms associated with Ozempic , thus it was stopped.) An ACE inhibitor/angiotensin II receptor blocker is being taken. He does not see a podiatrist.Eye exam is current.    Review of systems  Constitutional: +fluctuating body weight,  current Body mass index is 23.96 kg/m. , no fatigue, no subjective hyperthermia, no subjective hypothermia Eyes: no blurry vision, no xerophthalmia ENT: no sore throat, no nodules palpated in throat, no dysphagia/odynophagia, no hoarseness Cardiovascular: no chest pain, no shortness of breath, no palpitations, no leg swelling Respiratory: no cough, no shortness of breath Gastrointestinal: no nausea/vomiting/diarrhea Musculoskeletal: no muscle/joint aches Skin: no rashes, no hyperemia Neurological: no tremors, no numbness, no tingling, no dizziness Psychiatric: no depression, no anxiety   Objective:     BP 104/72 (BP Location: Left Arm, Patient Position: Sitting, Cuff Size: Large)   Pulse 91   Ht 6' 1 (1.854 m)   Wt 181 lb 9.6 oz (82.4 kg)   BMI 23.96 kg/m   Wt Readings from Last 3 Encounters:  05/30/24 181 lb 9.6 oz (82.4 kg)  05/05/24 170 lb (77.1 kg)  05/05/24 170 lb (77.1 kg)     BP Readings from Last 3 Encounters:  05/30/24 104/72  05/18/24 97/68  05/10/24 107/84      Physical Exam- Limited  Constitutional:  Body mass index is 23.96 kg/m. , not in acute distress, normal state of mind Eyes:  EOMI, no exophthalmos Musculoskeletal: no gross deformities, strength intact in all four extremities, no gross restriction of joint movements Skin:  no rashes, no hyperemia Neurological: no tremor  with outstretched hands   Diabetic Foot Exam - Simple   No data filed    CMP ( most recent) CMP  Component Value Date/Time   NA 130 (L) 05/05/2024 1306   NA 138 01/14/2024 0852   K 3.8 05/05/2024 1306   CL 97 (L) 05/05/2024 1306   CO2 21 (L) 05/05/2024 1306   GLUCOSE 508 (HH) 05/05/2024 1306   BUN 16 05/05/2024 1306   BUN 16 01/14/2024 0852   CREATININE 1.04 05/05/2024 1306   CREATININE 0.78 04/18/2014 0719   CALCIUM  9.5 05/05/2024 1306   PROT 6.3 01/14/2024 0852   ALBUMIN 3.7 (L) 01/14/2024 0852   ALBUMIN 30mg /L 06/18/2022 1112   AST 19 01/14/2024 0852   ALT 22 01/14/2024 0852   ALKPHOS 79 01/14/2024 0852   BILITOT 1.0 01/14/2024 0852   GFRNONAA >60 05/05/2024 1306   GFRAA 101 06/11/2020 1204     Diabetic Labs (most recent): Lab Results  Component Value Date   HGBA1C 13.4 (H) 05/05/2024   HGBA1C 7.3 (A) 01/25/2024   HGBA1C 7.6 (A) 10/28/2023   MICROALBUR 10 07/28/2023     Lipid Panel ( most recent) Lipid Panel     Component Value Date/Time   CHOL 105 01/14/2024 0852   TRIG 87 01/14/2024 0852   HDL 39 (L) 01/14/2024 0852   CHOLHDL 2.7 01/14/2024 0852   CHOLHDL 5.2 04/18/2014 0719   VLDL 41 (H) 04/18/2014 0719   LDLCALC 49 01/14/2024 0852   LABVLDL 17 01/14/2024 0852      Lab Results  Component Value Date   TSH 3.170 01/14/2024   TSH 3.390 01/19/2023   TSH 2.760 01/15/2022   TSH 3.940 12/23/2020   FREET4 1.18 01/14/2024   FREET4 1.33 01/19/2023   FREET4 1.20 01/15/2022           Assessment & Plan:   1) Type 2 diabetes mellitus without complication, without long-term current use of insulin  (HCC)  He presents today with his meter and logs showing fluctuating glycemic profile with above target readings overall.   His most recent A1c on 8/15 was 13.4%.  This was before his hernia surgery.  Analysis of his meter shows 14-day average of 208 with 9 readings.  He called between visits for GI symptoms associated with Ozempic , thus it was stopped.     - [name] [name] has currently uncontrolled symptomatic type 2 DM since 63 years of age.   -Recent labs reviewed.  - I had a long discussion with him about the progressive nature of diabetes and the pathology behind its complications. -his diabetes is complicated by neuropathy and mild CKD and he remains at a high risk for more acute and chronic complications which include CAD, CVA, CKD, retinopathy, and neuropathy. These are all discussed in detail with him.  - Nutritional counseling repeated at each appointment due to patients tendency to fall back in to old habits.  - The patient admits there is a room for improvement in their diet and drink choices. -  Suggestion is made for the patient to avoid simple carbohydrates from their diet including Cakes, Sweet Desserts / Pastries, Ice Cream, Soda (diet and regular), Sweet Tea, Candies, Chips, Cookies, Sweet Pastries, Store Bought Juices, Alcohol in Excess of 1-2 drinks a day, Artificial Sweeteners, Coffee Creamer, and Sugar-free Products. This will help patient to have stable blood glucose profile and potentially avoid unintended weight gain.   - I encouraged the patient to switch to unprocessed or minimally processed complex starch and increased protein intake (animal or plant source), fruits, and vegetables.   - Patient is advised to stick to a routine mealtimes to eat  3 meals a day and avoid unnecessary snacks (to snack only to correct hypoglycemia).  - he will be scheduled with Santana Duke, RDN, CDE for diabetes education.  - I have approached him with the following individualized plan to manage  his diabetes and patient agrees:   -He is advised to continue his Metformin  1000 mg po twice daily with meals and will change Glipizide  to 5 mg XL once daily at breakfast.   We did talk about possibly adding basal insulin  at next visit if A1c has not improved to near goal.  -he can take a break from routine glucose monitoring given safe  medication regimen.  - Adjustment parameters are given to him for hypo and hyperglycemia in writing.  - Specific targets for  A1c;  LDL, HDL, and Triglycerides were discussed with the patient.  2) Blood Pressure /Hypertension:  his blood pressure is controlled to target.   he is advised to continue his current medications as prescribed by PCP.  3) Lipids/Hyperlipidemia:    Review of his recent lipid panel from 01/14/24 showed controlled LDL at 49.  he is advised to continue Lipitor 20 mg daily at bedtime.  Side effects and precautions discussed with him.  He is also encouraged to avoid fried foods and butter.    4)  Weight/Diet:  his Body mass index is 23.96 kg/m.  -   he is a candidate for some weight loss. I discussed with him the fact that loss of 5 - 10% of his  current body weight will have the most impact on his diabetes management.  Exercise, and detailed carbohydrates information provided  -  detailed on discharge instructions.  5) Chronic Care/Health Maintenance: -he is on ACEI/ARB and Statin medications and is encouraged to initiate and continue to follow up with Ophthalmology, Dentist, Podiatrist at least yearly or according to recommendations, and advised to stay away from smoking. I have recommended yearly flu vaccine and pneumonia vaccine at least every 5 years; moderate intensity exercise for up to 150 minutes weekly; and sleep for at least 7 hours a day.    - he is advised to maintain close follow up with Cook, Jayce G, DO for primary care needs, as well as his other providers for optimal and coordinated care.     I spent  42  minutes in the care of the patient today including review of labs from CMP, Lipids, Thyroid  Function, Hematology (current and previous including abstractions from other facilities); face-to-face time discussing  his blood glucose readings/logs, discussing hypoglycemia and hyperglycemia episodes and symptoms, medications doses, his options of short and  long term treatment based on the latest standards of care / guidelines;  discussion about incorporating lifestyle medicine;  and documenting the encounter. Risk reduction counseling performed per USPSTF guidelines to reduce obesity and cardiovascular risk factors.     Please refer to Patient Instructions for Blood Glucose Monitoring and Insulin /Medications Dosing Guide  in media tab for additional information. Please  also refer to  Patient Self Inventory in the Media  tab for reviewed elements of pertinent patient history.  [name] [name] participated in the discussions, expressed understanding, and voiced agreement with the above plans.  All questions were answered to his satisfaction. he is encouraged to contact clinic should he have any questions or concerns prior to his return visit.   Follow up plan: - Return in about 3 months (around 08/29/2024) for Diabetes F/U with A1c in office, Bring meter and logs, No previsit labs.  Benton Rio, [facility] [address] [place], [place] [address] Phone: [phone] Fax: [phone]  05/30/2024, 9:42 AM

## 2024-06-08 ENCOUNTER — Other Ambulatory Visit: Payer: Self-pay | Admitting: Family Medicine

## 2024-06-08 DIAGNOSIS — G4700 Insomnia, unspecified: Secondary | ICD-10-CM

## 2024-06-23 ENCOUNTER — Telehealth: Payer: Self-pay | Admitting: Family Medicine

## 2024-06-23 NOTE — Telephone Encounter (Signed)
 Patient walked in stating that he is having some swelling at his surgical site and wants to make sure he has not caused another hernia. He states that it swells as the day goes on but in the morning it is not swollen. It is not pain just swollen. He states that it has been going on for several weeks.   I examined the area in rt groin area and it is swollen and firm but unable to reduce it with mild constant pressure.  Informed patient that I recommend he follow up with Dr. Mavis just to make sure it is not a recurrence. He agreed and apt made.

## 2024-06-27 ENCOUNTER — Ambulatory Visit (INDEPENDENT_AMBULATORY_CARE_PROVIDER_SITE_OTHER): Admitting: General Surgery

## 2024-06-27 ENCOUNTER — Encounter: Payer: Self-pay | Admitting: General Surgery

## 2024-06-27 VITALS — BP 100/71 | HR 93 | Temp 98.1°F | Resp 16 | Ht 73.0 in | Wt 178.0 lb

## 2024-06-27 DIAGNOSIS — Z09 Encounter for follow-up examination after completed treatment for conditions other than malignant neoplasm: Secondary | ICD-10-CM

## 2024-06-28 NOTE — Progress Notes (Signed)
 Subjective:     [name] [name]  Patient here for follow-up, status post robotic assisted laparoscopic right inguinal herniorrhaphy with mesh.  Patient states he had been doing well but has started to notice increasing swelling in the right groin region when he stands up.  It is not uncomfortable and he is asymptomatic.  He has been trying to avoid heavy lifting.  He is a Music therapist. Objective:    BP 100/71   Pulse 93   Temp 98.1 F (36.7 C) (Oral)   Resp 16   Ht 6' 1 (1.854 m)   Wt 178 lb (80.7 kg)   SpO2 96%   BMI 23.48 kg/m   General:  alert, cooperative, and no distress  Abdomen is soft, incisions healing well.  There is a swelling that develops when straining that appears to be right at the right pubic tubercle.  Is difficult to tell whether this is bowing of the mesh versus a discrete hernia.  This also could be a hydrocele.     Assessment:    Right groin swelling, question normal postoperative seroma versus recurrent hernia    Plan:   I told the patient that he could resume normal activity and we will follow this.  Will see him again in the office on 07/25/2024.

## 2024-07-09 ENCOUNTER — Other Ambulatory Visit: Payer: Self-pay | Admitting: Family Medicine

## 2024-07-25 ENCOUNTER — Ambulatory Visit: Admitting: General Surgery

## 2024-07-25 ENCOUNTER — Encounter: Payer: Self-pay | Admitting: General Surgery

## 2024-07-25 VITALS — BP 89/60 | HR 90 | Temp 98.1°F | Resp 12 | Ht 73.0 in | Wt 181.0 lb

## 2024-07-25 DIAGNOSIS — K4091 Unilateral inguinal hernia, without obstruction or gangrene, recurrent: Secondary | ICD-10-CM

## 2024-07-25 NOTE — Addendum Note (Signed)
 Addended by: SAUNDRA TAWNI DEL on: 07/25/2024 02:28 PM   Modules accepted: Orders

## 2024-07-25 NOTE — Progress Notes (Signed)
 Subjective:     [name] [name]  Patient returns to my care for follow-up of the right groin swelling.  Since I last saw him, the swelling seems to be larger.  It is extending down the spermatic cord.  It is starting to become uncomfortable.  It is made worse with straining. Objective:    BP (!) 89/60   Pulse 90   Temp 98.1 F (36.7 C) (Oral)   Resp 12   Ht 6' 1 (1.854 m)   Wt 181 lb (82.1 kg)   SpO2 96%   BMI 23.88 kg/m   General:  alert, cooperative, and no distress  Abdomen is soft, incisions are well-healed.  A larger swelling is noted in the right groin region and is extending down to the testicle.  It is reducible.     Assessment:    Suspect recurrent right inguinal hernia   Patient understands that this recurrence was a risk of the original surgery.  He would like to proceed with surgical repair. Plan:   Patient is scheduled for a recurrent right inguinal herniorrhaphy with mesh on 08/02/2024.  The risks and benefits of the procedure including bleeding, infection, mesh use, and the possibility of recurrence of the hernia were fully explained to the patient, who gave informed consent.

## 2024-07-26 NOTE — H&P (Signed)
 [name] [name] is an 63 y.o. male.   Chief Complaint: Recurrent right inguinal hernia HPI: Patient is a 63 year old white male status post robotic assisted laparoscopic right inguinal herniorrhaphy with mesh in August 2025 who returns with increasing swelling and discomfort in the right groin region.  It appears that he either has a recurrent right inguinal hernia or a hydrocele that is present.  Past Medical History:  Diagnosis Date   Anemia    Anxiety    Arthritis    Diabetes mellitus without complication (HCC)    Diabetes mellitus, type II (HCC)    Diverticulosis    pt unaware   ED (erectile dysfunction)    Fatty liver    GERD (gastroesophageal reflux disease)    History of COVID-19 07/2019   History of left inguinal hernia    History of retinal detachment    Right   Hyperlipidemia    Hypertension    Insomnia    Low ferritin level    Numbness and tingling of both feet    Shortness of breath dyspnea    since having COVID 07/2019 with exertions    Past Surgical History:  Procedure Laterality Date   BIOPSY  04/21/2022   Procedure: BIOPSY;  Surgeon: Cindie Carlin POUR, DO;  Location: [facility];  Service: Endoscopy;;   CHOLECYSTECTOMY     COLONOSCOPY N/A 11/30/2014   Procedure: COLONOSCOPY;  Surgeon: Lamar CHRISTELLA Hollingshead, MD;  Location: [facility];  Service: Endoscopy;  Laterality: N/A;  7:30 Am   COLONOSCOPY WITH PROPOFOL  N/A 04/21/2022   Procedure: COLONOSCOPY WITH PROPOFOL ;  Surgeon: Cindie Carlin POUR, DO;  Location: [facility];  Service: Endoscopy;  Laterality: N/A;  9:00am   ESOPHAGEAL DILATION     HERNIA REPAIR Left    1981   POLYPECTOMY  04/21/2022   Procedure: POLYPECTOMY;  Surgeon: Cindie Carlin POUR, DO;  Location: [facility];  Service: Endoscopy;;   RETINAL DETACHMENT SURGERY Right    TOTAL KNEE ARTHROPLASTY Left 12/11/2019   Procedure: TOTAL KNEE ARTHROPLASTY;  Surgeon: Melodi Lerner, MD;  Location: [facility];  Service: Orthopedics;  Laterality: Left;     XI ROBOTIC ASSISTED INGUINAL HERNIA REPAIR WITH MESH Right 05/10/2024   Procedure: REPAIR, HERNIA, INGUINAL, ROBOT-ASSISTED, LAPAROSCOPIC, USING MESH;  Surgeon: Mavis Anes, MD;  Location: [facility];  Service: General;  Laterality: Right;    Family History  Problem Relation Age of Onset   Diabetes Mother    Neuropathy Mother    Social History:  reports that he has never smoked. He has never been exposed to tobacco smoke. He has never used smokeless tobacco. He reports that he does not drink alcohol and does not use drugs.  Allergies:  Allergies  Allergen Reactions   Dilaudid  [Hydromorphone  Hcl] Other (See Comments)    Pt stated he passed out when he took it   Ozempic  (0.25 Or 0.5 Mg-Dose) [Semaglutide (0.25 Or 0.5mg -Dos)] Diarrhea and Other (See Comments)    Abdominal pain    No medications prior to admission.    No results found for this or any previous visit (from the past 48 hours). No results found.  Review of Systems  Constitutional: Negative.   HENT: Negative.    Eyes: Negative.   Respiratory: Negative.    Cardiovascular: Negative.   Gastrointestinal: Negative.   Endocrine: Negative.   Genitourinary: Negative.   Musculoskeletal: Negative.   Allergic/Immunologic: Negative.   Neurological: Negative.   Hematological: Negative.   Psychiatric/Behavioral: Negative.  There were no vitals taken for this visit. Physical Exam Vitals reviewed.  Constitutional:      Appearance: Normal appearance. He is normal weight. He is not ill-appearing.  HENT:     Head: Normocephalic and atraumatic.  Cardiovascular:     Rate and Rhythm: Normal rate and regular rhythm.     Heart sounds: Normal heart sounds. No murmur heard.    No friction rub. No gallop.  Pulmonary:     Effort: Pulmonary effort is normal. No respiratory distress.     Breath sounds: Normal breath sounds. No stridor. No wheezing, rhonchi or rales.  Abdominal:     General: Abdomen is flat. Bowel sounds are  normal. There is no distension.     Palpations: Abdomen is soft. There is no mass.     Tenderness: There is no abdominal tenderness. There is no guarding or rebound.     Hernia: A hernia is present.     Comments: Reducible right inguinal mass noted.  Genitourinary:    Testes: Normal.  Skin:    General: Skin is warm and dry.  Neurological:     Mental Status: He is alert and oriented to person, place, and time.      Assessment/Plan Impression: Recurrent right inguinal hernia, hydrocele Plan: Will proceed with an open recurrent right herniorrhaphy with mesh on 08/02/2024.  The risks and benefits of the procedure including bleeding, infection, mesh use, and the possibility of recurrence of the hernia were fully explained to the patient, who gave informed consent.  [name] Budge, MD 07/26/2024, 7:28 AM

## 2024-07-28 ENCOUNTER — Other Ambulatory Visit (HOSPITAL_COMMUNITY): Payer: Self-pay

## 2024-07-28 NOTE — Patient Instructions (Signed)
 [name] [name]  07/28/2024     @PREFPERIOPPHARMACY @   Your procedure is scheduled on  08/02/2024   Report to [facility] at 1120 A.M.   Call this number if you have problems the morning of surgery:  [phone]  If you experience any cold or flu symptoms such as cough, fever, chills, shortness of breath, etc. between now and your scheduled surgery, please notify us  at the above number.   Remember:       DO NOT take any medications for diabetes the morning of your procedure.   Do not eat after midnight.   You may drink clear liquids until 0920 am on 08/02/2024.      Clear liquids allowed are:                    Water , Carbonated beverages (diabetics please choose diet or no sugar options), Black Coffee Only (No creamer, milk or cream, including half & half and powdered creamer), and Clear Sports drink (No red color; diabetics please choose diet or no sugar options)    Take these medicines the morning of surgery with A SIP OF WATER                                 Lorazepam , omeprazole , tramadol .    Do not wear jewelry, make-up or nail polish, including gel polish,  artificial nails, or any other type of covering on natural nails (fingers and  toes).  Do not wear lotions, powders, or perfumes, or deodorant.  Do not shave 48 hours prior to surgery.  Men may shave face and neck.  Do not bring valuables to the hospital.  [facility] is not responsible for any belongings or valuables.  Contacts, dentures or bridgework may not be worn into surgery.  Leave your suitcase in the car.  After surgery it may be brought to your room.  For patients admitted to the hospital, discharge time will be determined by your treatment team.  Patients discharged the day of surgery will not be allowed to drive home and must have someone with them for 24 hours.    Special instructions:   DO NOT smoke tobacco or vape for 24 hours before your procedure.  Please read over the following fact  sheets that you were given. Coughing and Deep Breathing, Surgical Site Infection Prevention, Anesthesia Post-op Instructions, and Care and Recovery After Surgery      Open Hernia Repair, Adult, Care After After an open hernia repair, it's common to have: Pain in your belly. Slight bruising. A little swelling. A small amount of blood from the cut from surgery. Follow these instructions at home: Medicines Take your medicines only as told by your health care provider. If told, take steps to prevent trouble pooping. You may need to: Drink enough fluid to keep your pee (urine) pale yellow. Take medicines to help you poop. Eat foods high in fiber. These include beans, whole grains, and fresh fruits and vegetables. Ask your provider if you should avoid driving or using machines while you're taking your medicine. If you were given antibiotics, take them as told by your provider. Do not stop taking them even if you start to feel better. Incision care  Take care of your cut from surgery as told by your provider. Make sure you: Wash your hands with soap and water  for at least 20 seconds before  and after you change your bandage. If you can't use soap and water , use hand sanitizer. Change your bandage. Leave stitches or skin glue in place for at least 2 weeks. Leave tape strips alone unless you're told to take them off. You may trim the edges of the tape strips if they curl up. Check the area around your cut every day for signs of infection. Check for: More redness, swelling, or pain. More fluid or blood. Warmth. Pus or a bad smell. Wear loose clothes while your cut heals. Activity Rest as told. You may have to avoid lifting. Ask how much weight you can safely lift. Do not play contact sports until your provider says it's safe. Return to normal activities when you're told. Ask what things are safe for you to do. General instructions If you were given a sedative during your surgery, do not  drive or use machines until you're told it's safe. A sedative can make you sleepy. Do not take baths, swim, or use a hot tub until told. Ask if showers are okay. You may need to take sponge baths only. Hold a pillow over your belly when you cough or sneeze. This helps with pain. Do not smoke, vape, or use products with nicotine or tobacco in them. If you need help quitting, talk with your provider. Your provider may give you more instructions. Make sure you know what you can and can't do. Contact a health care provider if: You have signs of infection. You have a fever or chills. You have blood in your poop. You haven't pooped in 2-3 days. Your pain doesn't get better with medicine. Get help right away if: You have chest pain. You're short of breath. You feel faint or light-headed. You have very bad pain. You start to vomit. You have pain, swelling, or redness in a leg. These symptoms may be an emergency. Call 911 right away. Do not wait to see if the symptoms will go away. Do not drive yourself to the hospital. This information is not intended to replace advice given to you by your health care provider. Make sure you discuss any questions you have with your health care provider. Document Revised: 06/10/2023 Document Reviewed: 12/01/2022 Elsevier Patient Education  2024 Else[employer].General Anesthesia, Adult, Care After The following information offers guidance on how to care for yourself after your procedure. Your health care provider may also give you more specific instructions. If you have problems or questions, contact your health care provider. What can I expect after the procedure? After the procedure, it is common for people to: Have pain or discomfort at the IV site. Have nausea or vomiting. Have a sore throat or hoarseness. Have trouble concentrating. Feel cold or chills. Feel weak, sleepy, or tired (fatigue). Have soreness and body aches. These can affect parts of the body  that were not involved in surgery. Follow these instructions at home: For the time period you were told by your health care provider:  Rest. Do not participate in activities where you could fall or become injured. Do not drive or use machinery. Do not drink alcohol. Do not take sleeping pills or medicines that cause drowsiness. Do not make important decisions or sign legal documents. Do not take care of children on your own. General instructions Drink enough fluid to keep your urine pale yellow. If you have sleep apnea, surgery and certain medicines can increase your risk for breathing problems. Follow instructions from your health care provider about wearing your sleep device:  Anytime you are sleeping, including during daytime naps. While taking prescription pain medicines, sleeping medicines, or medicines that make you drowsy. Return to your normal activities as told by your health care provider. Ask your health care provider what activities are safe for you. Take over-the-counter and prescription medicines only as told by your health care provider. Do not use any products that contain nicotine or tobacco. These products include cigarettes, chewing tobacco, and vaping devices, such as e-cigarettes. These can delay incision healing after surgery. If you need help quitting, ask your health care provider. Contact a health care provider if: You have nausea or vomiting that does not get better with medicine. You vomit every time you eat or drink. You have pain that does not get better with medicine. You cannot urinate or have bloody urine. You develop a skin rash. You have a fever. Get help right away if: You have trouble breathing. You have chest pain. You vomit blood. These symptoms may be an emergency. Get help right away. Call 911. Do not wait to see if the symptoms will go away. Do not drive yourself to the hospital. Summary After the procedure, it is common to have a sore throat,  hoarseness, nausea, vomiting, or to feel weak, sleepy, or fatigue. For the time period you were told by your health care provider, do not drive or use machinery. Get help right away if you have difficulty breathing, have chest pain, or vomit blood. These symptoms may be an emergency. This information is not intended to replace advice given to you by your health care provider. Make sure you discuss any questions you have with your health care provider. Document Revised: 12/05/2021 Document Reviewed: 12/05/2021 Elsevier Patient Education  2024 Elsevier Inc.How to Use Chlorhexidine  at Home in the Shower Chlorhexidine  gluconate (CHG) is a germ-killing (antiseptic) wash that's used to clean the skin. It can get rid of the germs that normally live on the skin and can keep them away for about 24 hours. If you're having surgery, you may be told to shower with CHG at home the night before surgery. This can help lower your risk for infection. To use CHG wash in the shower, follow the steps below. Supplies needed: CHG body wash. Clean washcloth. Clean towel. How to use CHG in the shower Follow these steps unless you're told to use CHG in a different way: Start the shower. Use your normal soap and shampoo to wash your face and hair. Turn off the shower or move out of the shower stream. Pour CHG onto a clean washcloth. Do not use any type of brush or rough sponge. Start at your neck, washing your body down to your toes. Make sure you: Wash the part of your body where the surgery will be done for at least 1 minute. Do not scrub. Do not use CHG on your head or face unless your health care provider tells you to. If it gets into your ears or eyes, rinse them well with water . Do not wash your genitals with CHG. Wash your back and under your arms. Make sure to wash skin folds. Let the CHG sit on your skin for 1-2 minutes or as long as told. Rinse your entire body in the shower, including all body creases and  folds. Turn off the shower. Dry off with a clean towel. Do not put anything on your skin afterward, such as powder, lotion, or perfume. Put on clean clothes or pajamas. If it's the night before surgery, sleep  in clean sheets. General tips Use CHG only as told, and follow the instructions on the label. Use the full amount of CHG as told. This is often one bottle. Do not smoke and stay away from flames after using CHG. Your skin may feel sticky after using CHG. This is normal. The sticky feeling will go away as the CHG dries. Do not use CHG: If you have a chlorhexidine  allergy or have reacted to chlorhexidine  in the past. On open wounds or areas of skin that have broken skin, cuts, or scrapes. On babies younger than 26 months of age. Contact a health care provider if: You have questions about using CHG. Your skin gets irritated or itchy. You have a rash after using CHG. You swallow any CHG. Call your local poison control center [phone] in the [place] Your eyes itch badly, or they become very red or swollen. Your hearing changes. You have trouble seeing. If you can't reach your provider, go to an urgent care or emergency room. Do not drive yourself. Get help right away if: You have swelling or tingling in your mouth or throat. You make high-pitched whistling sounds when you breathe, most often when you breathe out (wheeze). You have trouble breathing. These symptoms may be an emergency. Call 911 right away. Do not wait to see if the symptoms will go away. Do not drive yourself to the hospital. This information is not intended to replace advice given to you by your health care provider. Make sure you discuss any questions you have with your health care provider. Document Revised: 03/23/2023 Document Reviewed: 03/19/2022 Elsevier Patient Education  2024 [employer].

## 2024-07-31 ENCOUNTER — Encounter (HOSPITAL_COMMUNITY)
Admission: RE | Admit: 2024-07-31 | Discharge: 2024-07-31 | Disposition: A | Discharge status: Home / Self Care | Source: Ambulatory Visit | Attending: General Surgery | Admitting: General Surgery

## 2024-07-31 DIAGNOSIS — Z01812 Encounter for preprocedural laboratory examination: Secondary | ICD-10-CM | POA: Insufficient documentation

## 2024-07-31 DIAGNOSIS — E611 Iron deficiency: Secondary | ICD-10-CM | POA: Diagnosis not present

## 2024-07-31 DIAGNOSIS — E119 Type 2 diabetes mellitus without complications: Secondary | ICD-10-CM | POA: Diagnosis not present

## 2024-07-31 LAB — CBC WITH DIFFERENTIAL/PLATELET
Abs Immature Granulocytes: 0.02 K/uL (ref 0.00–0.07)
Basophils Absolute: 0 K/uL (ref 0.0–0.1)
Basophils Relative: 1 %
Eosinophils Absolute: 0.2 K/uL (ref 0.0–0.5)
Eosinophils Relative: 3 %
HCT: 40.8 % (ref 39.0–52.0)
Hemoglobin: 13.4 g/dL (ref 13.0–17.0)
Immature Granulocytes: 0 %
Lymphocytes Relative: 28 %
Lymphs Abs: 1.9 K/uL (ref 0.7–4.0)
MCH: 30.7 pg (ref 26.0–34.0)
MCHC: 32.8 g/dL (ref 30.0–36.0)
MCV: 93.6 fL (ref 80.0–100.0)
Monocytes Absolute: 0.4 K/uL (ref 0.1–1.0)
Monocytes Relative: 6 %
Neutro Abs: 4.3 K/uL (ref 1.7–7.7)
Neutrophils Relative %: 62 %
Platelets: 160 K/uL (ref 150–400)
RBC: 4.36 MIL/uL (ref 4.22–5.81)
RDW: 13.3 % (ref 11.5–15.5)
WBC: 7 K/uL (ref 4.0–10.5)
nRBC: 0 % (ref 0.0–0.2)

## 2024-07-31 LAB — BASIC METABOLIC PANEL WITH GFR
Anion gap: 11 (ref 5–15)
BUN: 13 mg/dL (ref 8–23)
CO2: 25 mmol/L (ref 22–32)
Calcium: 9.8 mg/dL (ref 8.9–10.3)
Chloride: 105 mmol/L (ref 98–111)
Creatinine, Ser: 0.8 mg/dL (ref 0.61–1.24)
GFR, Estimated: >60 mL/min (ref >60)
Glucose, Bld: 209 mg/dL — ABNORMAL HIGH (ref 70–99)
Potassium: 4.3 mmol/L (ref 3.5–5.1)
Sodium: 141 mmol/L (ref 135–145)

## 2024-07-31 LAB — HEMOGLOBIN A1C
Hgb A1c MFr Bld: 8.7 % — ABNORMAL HIGH (ref 4.8–5.6)
Mean Plasma Glucose: 202.99 mg/dL

## 2024-08-02 ENCOUNTER — Ambulatory Visit (HOSPITAL_COMMUNITY): Admitting: Anesthesiology

## 2024-08-02 ENCOUNTER — Encounter (HOSPITAL_COMMUNITY)
Admission: RE | Disposition: A | Discharge status: Home / Self Care | Payer: Self-pay | Source: Home / Self Care | Attending: General Surgery

## 2024-08-02 ENCOUNTER — Ambulatory Visit (HOSPITAL_COMMUNITY)
Admission: RE | Admit: 2024-08-02 | Discharge: 2024-08-02 | Disposition: A | Discharge status: Home / Self Care | Attending: General Surgery | Admitting: General Surgery

## 2024-08-02 ENCOUNTER — Other Ambulatory Visit: Payer: Self-pay

## 2024-08-02 ENCOUNTER — Encounter (HOSPITAL_COMMUNITY): Payer: Self-pay | Admitting: General Surgery

## 2024-08-02 DIAGNOSIS — K635 Polyp of colon: Secondary | ICD-10-CM | POA: Insufficient documentation

## 2024-08-02 DIAGNOSIS — D649 Anemia, unspecified: Secondary | ICD-10-CM | POA: Insufficient documentation

## 2024-08-02 DIAGNOSIS — E785 Hyperlipidemia, unspecified: Secondary | ICD-10-CM | POA: Insufficient documentation

## 2024-08-02 DIAGNOSIS — F419 Anxiety disorder, unspecified: Secondary | ICD-10-CM | POA: Insufficient documentation

## 2024-08-02 DIAGNOSIS — I10 Essential (primary) hypertension: Secondary | ICD-10-CM | POA: Diagnosis not present

## 2024-08-02 DIAGNOSIS — K219 Gastro-esophageal reflux disease without esophagitis: Secondary | ICD-10-CM | POA: Diagnosis not present

## 2024-08-02 DIAGNOSIS — R0602 Shortness of breath: Secondary | ICD-10-CM | POA: Diagnosis not present

## 2024-08-02 DIAGNOSIS — K4091 Unilateral inguinal hernia, without obstruction or gangrene, recurrent: Secondary | ICD-10-CM

## 2024-08-02 DIAGNOSIS — E119 Type 2 diabetes mellitus without complications: Secondary | ICD-10-CM | POA: Insufficient documentation

## 2024-08-02 HISTORY — PX: INGUINAL HERNIA REPAIR: SHX194

## 2024-08-02 LAB — GLUCOSE, CAPILLARY: Glucose-Capillary: 192 mg/dL — ABNORMAL HIGH (ref 70–99)

## 2024-08-02 SURGERY — REPAIR, HERNIA, INGUINAL, ADULT
Anesthesia: General | Site: Inguinal | Laterality: Right

## 2024-08-02 MED ORDER — CEFAZOLIN SODIUM-DEXTROSE 2-4 GM/100ML-% IV SOLN
2.0000 g | INTRAVENOUS | Status: AC
  Administered 2024-08-02: 2 g via INTRAVENOUS

## 2024-08-02 MED ORDER — BUPIVACAINE HCL (PF) 0.5 % IJ SOLN
INTRAMUSCULAR | Status: AC
Start: 2024-08-02 — End: 2024-08-02
  Filled 2024-08-02: qty 30

## 2024-08-02 MED ORDER — ONDANSETRON HCL 4 MG/2ML IJ SOLN
INTRAMUSCULAR | Status: DC | PRN
Start: 2024-08-02 — End: 2024-08-02
  Administered 2024-08-02: 4 mg via INTRAVENOUS

## 2024-08-02 MED ORDER — BUPIVACAINE HCL (PF) 0.5 % IJ SOLN
INTRAMUSCULAR | Status: DC | PRN
  Administered 2024-08-02: 30 mL

## 2024-08-02 MED ORDER — MIDAZOLAM HCL 2 MG/2ML IJ SOLN
INTRAMUSCULAR | Status: AC
  Filled 2024-08-02: qty 2

## 2024-08-02 MED ORDER — FENTANYL CITRATE (PF) 50 MCG/ML IJ SOSY: 25.0000 ug | PREFILLED_SYRINGE | INTRAMUSCULAR | Status: DC | PRN

## 2024-08-02 MED ORDER — CEFAZOLIN SODIUM-DEXTROSE 2-4 GM/100ML-% IV SOLN
INTRAVENOUS | Status: AC
  Filled 2024-08-02: qty 100

## 2024-08-02 MED ORDER — EPHEDRINE 5 MG/ML INJ
INTRAVENOUS | Status: AC
Start: 2024-08-02 — End: 2024-08-02
  Filled 2024-08-02: qty 5

## 2024-08-02 MED ORDER — PROPOFOL 10 MG/ML IV BOLUS
INTRAVENOUS | Status: AC
  Filled 2024-08-02: qty 20

## 2024-08-02 MED ORDER — CHLORHEXIDINE GLUCONATE 0.12 % MT SOLN: 15.0000 mL | Freq: Once | OROMUCOSAL | Status: DC

## 2024-08-02 MED ORDER — DEXAMETHASONE SOD PHOSPHATE PF 10 MG/ML IJ SOLN
INTRAMUSCULAR | Status: DC | PRN
  Administered 2024-08-02: 5 mg via INTRAVENOUS

## 2024-08-02 MED ORDER — KETOROLAC TROMETHAMINE 30 MG/ML IJ SOLN
30.0000 mg | Freq: Once | INTRAMUSCULAR | Status: AC
  Administered 2024-08-02: 30 mg via INTRAVENOUS
  Filled 2024-08-02: qty 1

## 2024-08-02 MED ORDER — LIDOCAINE 2% (20 MG/ML) 5 ML SYRINGE
INTRAMUSCULAR | Status: DC | PRN
  Administered 2024-08-02: 60 mg via INTRAVENOUS

## 2024-08-02 MED ORDER — OXYCODONE HCL 5 MG/5ML PO SOLN: 5.0000 mg | Freq: Once | ORAL | Status: DC | PRN

## 2024-08-02 MED ORDER — ONDANSETRON HCL 4 MG/2ML IJ SOLN: 4.0000 mg | Freq: Once | INTRAMUSCULAR | Status: DC | PRN

## 2024-08-02 MED ORDER — ROCURONIUM BROMIDE 10 MG/ML (PF) SYRINGE
PREFILLED_SYRINGE | INTRAVENOUS | Status: DC | PRN
Start: 2024-08-02 — End: 2024-08-02
  Administered 2024-08-02: 50 mg via INTRAVENOUS

## 2024-08-02 MED ORDER — FENTANYL CITRATE (PF) 100 MCG/2ML IJ SOLN
INTRAMUSCULAR | Status: DC | PRN
  Administered 2024-08-02: 100 ug via INTRAVENOUS

## 2024-08-02 MED ORDER — LIDOCAINE 2% (20 MG/ML) 5 ML SYRINGE
INTRAMUSCULAR | Status: AC
  Filled 2024-08-02: qty 5

## 2024-08-02 MED ORDER — CHLORHEXIDINE GLUCONATE CLOTH 2 % EX PADS: 6.0000 | MEDICATED_PAD | Freq: Once | CUTANEOUS | Status: DC

## 2024-08-02 MED ORDER — MIDAZOLAM HCL (PF) 2 MG/2ML IJ SOLN
INTRAMUSCULAR | Status: DC | PRN
  Administered 2024-08-02: 2 mg via INTRAVENOUS

## 2024-08-02 MED ORDER — OXYCODONE HCL 5 MG PO TABS: 5.0000 mg | ORAL_TABLET | Freq: Once | ORAL | Status: DC | PRN

## 2024-08-02 MED ORDER — FENTANYL CITRATE (PF) 100 MCG/2ML IJ SOLN
INTRAMUSCULAR | Status: AC
  Filled 2024-08-02: qty 2

## 2024-08-02 MED ORDER — EPHEDRINE SULFATE (PRESSORS) 25 MG/5ML IV SOSY
PREFILLED_SYRINGE | INTRAVENOUS | Status: DC | PRN
Start: 2024-08-02 — End: 2024-08-02
  Administered 2024-08-02: 10 mg via INTRAVENOUS
  Administered 2024-08-02: 5 mg via INTRAVENOUS
  Administered 2024-08-02: 10 mg via INTRAVENOUS

## 2024-08-02 MED ORDER — PHENYLEPHRINE 80 MCG/ML (10ML) SYRINGE FOR IV PUSH (FOR BLOOD PRESSURE SUPPORT)
PREFILLED_SYRINGE | INTRAVENOUS | Status: AC
  Filled 2024-08-02: qty 20

## 2024-08-02 MED ORDER — SODIUM CHLORIDE 0.9 % IR SOLN
Status: DC | PRN
  Administered 2024-08-02: 1000 mL

## 2024-08-02 MED ORDER — ORAL CARE MOUTH RINSE: 15.0000 mL | Freq: Once | OROMUCOSAL | Status: DC

## 2024-08-02 MED ORDER — PHENYLEPHRINE 80 MCG/ML (10ML) SYRINGE FOR IV PUSH (FOR BLOOD PRESSURE SUPPORT)
PREFILLED_SYRINGE | INTRAVENOUS | Status: DC | PRN
  Administered 2024-08-02 (×5): 80 ug via INTRAVENOUS

## 2024-08-02 MED ORDER — ROCURONIUM BROMIDE 10 MG/ML (PF) SYRINGE
PREFILLED_SYRINGE | INTRAVENOUS | Status: AC
  Filled 2024-08-02: qty 20

## 2024-08-02 MED ORDER — LACTATED RINGERS IV SOLN: INTRAVENOUS | Status: DC

## 2024-08-02 MED ORDER — SUGAMMADEX SODIUM 200 MG/2ML IV SOLN
INTRAVENOUS | Status: DC | PRN
  Administered 2024-08-02: 200 mg via INTRAVENOUS

## 2024-08-02 MED ORDER — CHLORHEXIDINE GLUCONATE 0.12 % MT SOLN
OROMUCOSAL | Status: AC
  Filled 2024-08-02: qty 135

## 2024-08-02 MED ORDER — ONDANSETRON HCL 4 MG/2ML IJ SOLN
INTRAMUSCULAR | Status: AC
Start: 2024-08-02 — End: 2024-08-02
  Filled 2024-08-02: qty 4

## 2024-08-02 MED ORDER — PROPOFOL 500 MG/50ML IV EMUL
INTRAVENOUS | Status: AC
  Filled 2024-08-02: qty 50

## 2024-08-02 MED ORDER — PROPOFOL 10 MG/ML IV BOLUS
INTRAVENOUS | Status: DC | PRN
  Administered 2024-08-02: 50 mg via INTRAVENOUS
  Administered 2024-08-02: 150 mg via INTRAVENOUS

## 2024-08-02 SURGICAL SUPPLY — 28 items
CLOTH BEACON ORANGE TIMEOUT ST (SAFETY) ×1 IMPLANT
COVER LIGHT HANDLE (MISCELLANEOUS) IMPLANT
DERMABOND ADVANCED .7 DNX12 (GAUZE/BANDAGES/DRESSINGS) ×1 IMPLANT
DRAIN PENROSE 0.5X18 (DRAIN) ×1 IMPLANT
ELECTRODE REM PT RTRN 9FT ADLT (ELECTROSURGICAL) ×1 IMPLANT
GAUZE 4X4 16PLY ~~LOC~~+RFID DBL (SPONGE) IMPLANT
GAUZE SPONGE 4X4 12PLY STRL (GAUZE/BANDAGES/DRESSINGS) ×1 IMPLANT
GLOVE BIOGEL PI IND STRL 7.0 (GLOVE) ×2 IMPLANT
GLOVE SURG SS PI 7.5 STRL IVOR (GLOVE) ×2 IMPLANT
GOWN STRL REUS W/TWL LRG LVL3 (GOWN DISPOSABLE) ×3 IMPLANT
KIT TURNOVER KIT A (KITS) ×1 IMPLANT
MANIFOLD NEPTUNE II (INSTRUMENTS) ×1 IMPLANT
MESH HERNIA 1.6X1.9 PLUG LRG (Mesh General) IMPLANT
NDL HYPO 21X1.5 SAFETY (NEEDLE) ×1 IMPLANT
NEEDLE HYPO 21X1.5 SAFETY (NEEDLE) ×1 IMPLANT
PACK MINOR (CUSTOM PROCEDURE TRAY) ×1 IMPLANT
PAD ARMBOARD POSITIONER FOAM (MISCELLANEOUS) ×1 IMPLANT
POSITIONER HEAD 8X9X4 ADT (SOFTGOODS) ×1 IMPLANT
SET BASIN LINEN APH (SET/KITS/TRAYS/PACK) ×1 IMPLANT
SOL PREP POV-IOD 4OZ 10% (MISCELLANEOUS) IMPLANT
SOLN 0.9% NACL POUR BTL 1000ML (IV SOLUTION) ×1 IMPLANT
SOLUTION SCRB POV-IOD 4OZ 7.5% (MISCELLANEOUS) ×1 IMPLANT
SUT MNCRL AB 4-0 PS2 18 (SUTURE) ×1 IMPLANT
SUT PROLENE 2 0 CR (SUTURE) ×2 IMPLANT
SUT VIC AB 2-0 CT1 TAPERPNT 27 (SUTURE) ×1 IMPLANT
SUT VIC AB 3-0 SH 27X BRD (SUTURE) ×1 IMPLANT
SUT VICRYL AB 3 0 TIES (SUTURE) IMPLANT
SYR 30ML LL (SYRINGE) ×1 IMPLANT

## 2024-08-02 NOTE — Op Note (Signed)
 Patient:  [name] [name]  DOB:  Apr 11, 1961  MRN:  [id number]   Preop Diagnosis: Recurrent right inguinal hernia  Postop Diagnosis: Same  Procedure: Recurrent right inguinal herniorrhaphy with mesh  Surgeon: [name], MD  Anes: General Endotracheal  Indications: Patient is a 63 year old white male status post a robotic assisted laparoscopic right inguinal herniorrhaphy with mesh earlier this year who now has a recurrence of a right inguinal hernia.  The risks and benefits of the procedure including bleeding, infection, nerve injury, vascular injury, and the possibility of recurrence of the hernia were fully explained to the patient, who gave informed consent.  Procedure note: The patient was placed in the supine position.  After induction of general endotracheal anesthesia, the abdomen was prepped and draped using the usual sterile technique with Betadine .  Surgical site confirmation was performed.  A transverse incision was made in the right groin region down to the external Bleich aponeurosis.  The aponeurosis was incised to the external ring.  A Penrose drain was placed around the spermatic cord.  The vas deferens was noted the spermatic cord.  This was done after extensive dissection of the hernia sac off the spermatic cord.  There appeared to be some fusion of the hernia sac to the spermatic cord.  Once this was freed away up to the peritoneal reflection, the hernia sac was inverted.  A large Bard prefix plug was placed in the internal ring.  An onlay patch was placed along the floor of the inguinal canal and secured superiorly to the conjoined tendon and inferiorly to the shelving edge of Poupart's ligament using 2-0 Prolene interrupted sutures.  The internal ring was recreated using a 2-0 Prolene interrupted suture.  The external Bleich aponeurosis was reapproximated using a 2-0 Vicryl running suture.  The subcutaneous layer was reapproximated using 3-0 Vicryl interrupted sutures.  The  skin was closed using a 4-0 Monocryl subcuticular suture.  Dermabond was applied.  All tape and needle counts were correct at the end of the procedure.  The patient was extubated in the operating room and transferred to PACU in stable condition.  Complications: None  EBL: 75 cc  Specimen: None

## 2024-08-02 NOTE — Anesthesia Preprocedure Evaluation (Signed)
 Anesthesia Evaluation  Patient identified by MRN, date of birth, ID band Patient awake    Reviewed: Allergy & Precautions, H&P , NPO status , Patient's Chart, lab work & pertinent test results, reviewed documented beta blocker date and time   Airway Mallampati: II  TM Distance: >3 FB Neck ROM: full    Dental no notable dental hx.    Pulmonary shortness of breath   Pulmonary exam normal breath sounds clear to auscultation       Cardiovascular Exercise Tolerance: Good hypertension,  Rhythm:regular Rate:Normal     Neuro/Psych   Anxiety     negative neurological ROS  negative psych ROS   GI/Hepatic Neg liver ROS,GERD  ,,  Endo/Other  diabetes    Renal/GU negative Renal ROS  negative genitourinary   Musculoskeletal   Abdominal   Peds  Hematology  (+) Blood dyscrasia, anemia   Anesthesia Other Findings   Reproductive/Obstetrics negative OB ROS                              Anesthesia Physical Anesthesia Plan  ASA: 3  Anesthesia Plan: General and General ETT   Post-op Pain Management:    Induction:   PONV Risk Score and Plan: Ondansetron   Airway Management Planned:   Additional Equipment:   Intra-op Plan:   Post-operative Plan:   Informed Consent: I have reviewed the patients History and Physical, chart, labs and discussed the procedure including the risks, benefits and alternatives for the proposed anesthesia with the patient or authorized representative who has indicated his/her understanding and acceptance.     Dental Advisory Given  Plan Discussed with: CRNA  Anesthesia Plan Comments:         Anesthesia Quick Evaluation

## 2024-08-02 NOTE — Interval H&P Note (Signed)
 History and Physical Interval Note:  08/02/2024 11:51 AM  [name] [name]  has presented today for surgery, with the diagnosis of RECURRENT INGUINAL HERNIA, RIGHT.  The various methods of treatment have been discussed with the patient and family. After consideration of risks, benefits and other options for treatment, the patient has consented to  Procedure(s): REPAIR, HERNIA, INGUINAL, ADULT (Right) as a surgical intervention.  The patient's history has been reviewed, patient examined, no change in status, stable for surgery.  I have reviewed the patient's chart and labs.  Questions were answered to the patient's satisfaction.     [name]

## 2024-08-02 NOTE — Transfer of Care (Signed)
 Immediate Anesthesia Transfer of Care Note  Patient: [name] [name]  Procedure(s) Performed: REPAIR, HERNIA, INGUINAL, ADULT (Right: Inguinal)  Patient Location: PACU  Anesthesia Type:General  Level of Consciousness: awake, alert , oriented, and patient cooperative  Airway & Oxygen Therapy: Patient Spontanous Breathing and Patient connected to face mask oxygen  Post-op Assessment: Report given to RN, Post -op Vital signs reviewed and stable, and Patient moving all extremities X 4  Post vital signs: Reviewed and stable  Last Vitals:  Vitals Value Taken Time  BP 137/97 08/02/24 14:04  Temp 36.4 C 08/02/24 14:04  Pulse 79 08/02/24 14:09  Resp 20 08/02/24 14:12  SpO2 99 % 08/02/24 14:09  Vitals shown include unfiled device data.  Last Pain:  Vitals:   08/02/24 1148  TempSrc:   PainSc: (P) 0-No pain      Patients Stated Pain Goal: 4 (08/02/24 1147)  Complications: No notable events documented.

## 2024-08-02 NOTE — Anesthesia Procedure Notes (Signed)
 Procedure Name: Intubation Date/Time: 08/02/2024 12:35 PM  Performed by: Cordella Elvie HERO, CRNAPre-anesthesia Checklist: Patient identified, Patient being monitored, Timeout performed, Emergency Drugs available and Suction available Patient Re-evaluated:Patient Re-evaluated prior to induction Oxygen Delivery Method: Circle System Utilized Preoxygenation: Pre-oxygenation with 100% oxygen Induction Type: IV induction Ventilation: Mask ventilation without difficulty Laryngoscope Size: Mac and 4 Grade View: Grade II Tube type: Oral Tube size: 7.0 mm Number of attempts: 1 Airway Equipment and Method: stylet Placement Confirmation: ETT inserted through vocal cords under direct vision, positive ETCO2 and breath sounds checked- equal and bilateral Secured at: 21 cm Tube secured with: Tape Dental Injury: Teeth and Oropharynx as per pre-operative assessment

## 2024-08-03 ENCOUNTER — Encounter (HOSPITAL_COMMUNITY): Payer: Self-pay | Admitting: General Surgery

## 2024-08-04 NOTE — Anesthesia Postprocedure Evaluation (Signed)
 Anesthesia Post Note  Patient: [name] [name]  Procedure(s) Performed: REPAIR, HERNIA, INGUINAL, ADULT (Right: Inguinal)  Patient location during evaluation: Phase II Anesthesia Type: General Level of consciousness: awake Pain management: pain level controlled Vital Signs Assessment: post-procedure vital signs reviewed and stable Respiratory status: spontaneous breathing and respiratory function stable Cardiovascular status: blood pressure returned to baseline and stable Postop Assessment: no headache and no apparent nausea or vomiting Anesthetic complications: no Comments: Late entry   No notable events documented.   Last Vitals:  Vitals:   08/02/24 1445 08/02/24 1459  BP: 119/80 109/83  Pulse: 67 67  Resp: (!) 22 18  Temp:  36.5 C  SpO2: 98% 96%    Last Pain:  Vitals:   08/02/24 1459  TempSrc: Oral  PainSc: 0-No pain                 Yvonna JINNY Bosworth

## 2024-08-08 ENCOUNTER — Ambulatory Visit (INDEPENDENT_AMBULATORY_CARE_PROVIDER_SITE_OTHER): Admitting: General Surgery

## 2024-08-08 ENCOUNTER — Other Ambulatory Visit: Payer: Self-pay | Admitting: Family Medicine

## 2024-08-08 ENCOUNTER — Encounter: Payer: Self-pay | Admitting: General Surgery

## 2024-08-08 ENCOUNTER — Other Ambulatory Visit: Payer: Self-pay

## 2024-08-08 VITALS — BP 113/81 | HR 96 | Temp 97.8°F | Resp 16 | Ht 73.0 in | Wt 174.0 lb

## 2024-08-08 DIAGNOSIS — Z09 Encounter for follow-up examination after completed treatment for conditions other than malignant neoplasm: Secondary | ICD-10-CM

## 2024-08-08 MED ORDER — TRAMADOL HCL 50 MG PO TABS: 50.0000 mg | ORAL_TABLET | Freq: Two times a day (BID) | ORAL | 3 refills | Status: AC | PRN

## 2024-08-08 MED ORDER — TAMSULOSIN HCL 0.4 MG PO CAPS: 0.8000 mg | ORAL_CAPSULE | Freq: Every day | ORAL | 1 refills | Status: AC

## 2024-08-08 MED ORDER — DUTASTERIDE 0.5 MG PO CAPS: 0.5000 mg | ORAL_CAPSULE | Freq: Every day | ORAL | 0 refills | Status: DC

## 2024-08-08 NOTE — Progress Notes (Signed)
 Subjective:     [name] [name]  Patient here for postoperative visit, status post recurrent right inguinal herniorrhaphy with mesh.  Patient states he is sore but is progressing well. Objective:    BP 113/81   Pulse 96   Temp 97.8 F (36.6 C) (Oral)   Resp 16   Ht 6' 1 (1.854 m)   Wt 174 lb (78.9 kg)   SpO2 96%   BMI 22.96 kg/m   General:  alert, cooperative, and no distress  Right inguinal incision healing well.  Some spermatic cord swelling present but no hematoma or seroma is present.  No recurrent hernias present.     Assessment:    Doing well postoperatively.    Plan:   Continue avoiding heavy lifting over 20 pounds.  Will follow-up on 08/29/2024.

## 2024-08-22 ENCOUNTER — Encounter: Payer: Self-pay | Admitting: Family Medicine

## 2024-08-22 ENCOUNTER — Ambulatory Visit: Admitting: Family Medicine

## 2024-08-22 VITALS — BP 113/75 | HR 72 | Ht 73.0 in | Wt 183.0 lb

## 2024-08-22 DIAGNOSIS — E785 Hyperlipidemia, unspecified: Secondary | ICD-10-CM | POA: Diagnosis not present

## 2024-08-22 DIAGNOSIS — E1165 Type 2 diabetes mellitus with hyperglycemia: Secondary | ICD-10-CM | POA: Insufficient documentation

## 2024-08-22 DIAGNOSIS — Z7984 Long term (current) use of oral hypoglycemic drugs: Secondary | ICD-10-CM | POA: Diagnosis not present

## 2024-08-22 DIAGNOSIS — Z23 Encounter for immunization: Secondary | ICD-10-CM | POA: Diagnosis not present

## 2024-08-22 DIAGNOSIS — I10 Essential (primary) hypertension: Secondary | ICD-10-CM | POA: Diagnosis not present

## 2024-08-22 MED ORDER — ATORVASTATIN CALCIUM 20 MG PO TABS: 20.0000 mg | ORAL_TABLET | Freq: Every day | ORAL | 3 refills | Status: AC

## 2024-08-22 NOTE — Progress Notes (Signed)
 Subjective:  [name], male    DOB: 1961/04/14  Age: 63 y.o. MRN: [id number]  CC:   Chief Complaint  Patient presents with   Hypertension    Six month follow up     HPI:  63 year old male presents for follow-up.  Type 2 diabetes is uncontrolled.  He is now off Ozempic .  He stopped due to weight loss.  He is managed by endocrinology.  Last A1c was 8.7.  He is currently on glipizide  and metformin .  He needs a urine ACR today.  He states that he has an upcoming eye exam on 12/19.  In regards to his preventative care, he is amendable to getting a flu vaccine and a pneumococcal vaccine today.  Hypertension is stable and well-controlled on losartan .  He has recently had hernia repair.  He states overall he is doing okay in regards to this.  Still having some discomfort following the surgery.  Patient's lipids are well-controlled on Lipitor.  He is tolerating well.  Patient Active Problem List   Diagnosis Date Noted   Type 2 diabetes mellitus with hyperglycemia, without long-term current use of insulin  (HCC) 08/22/2024   GERD (gastroesophageal reflux disease) 08/20/2022   Iron  deficiency 08/20/2022   Insomnia 08/20/2022   Nocturia associated with benign prostatic hyperplasia 12/20/2020   Osteoarthritis of left knee 12/11/2019   Hyperlipidemia 06/20/2018   Erectile dysfunction 12/16/2016   Essential hypertension 06/19/2016    Social Hx   Social History   Socioeconomic History   Marital status: Married    Spouse name: Not on file   Number of children: Not on file   Years of education: Not on file   Highest education level: 12th grade  Occupational History   Not on file  Tobacco Use   Smoking status: Never    Passive exposure: Never   Smokeless tobacco: Never  Vaping Use   Vaping status: Never Used  Substance and Sexual Activity   Alcohol use: No   Drug use: No   Sexual activity: Never  Other Topics Concern   Not on file  Social History Narrative    Not on file   Social Drivers of Health   Financial Resource Strain: Low Risk  (08/21/2024)   Overall Financial Resource Strain (CARDIA)    Difficulty of Paying Living Expenses: Not hard at all  Food Insecurity: No Food Insecurity (08/21/2024)   Hunger Vital Sign    Worried About Running Out of Food in the Last Year: Never true    Ran Out of Food in the Last Year: Never true  Transportation Needs: No Transportation Needs (08/21/2024)   PRAPARE - Administrator, Civil Service (Medical): No    Lack of Transportation (Non-Medical): No  Physical Activity: Unknown (08/21/2024)   Exercise Vital Sign    Days of Exercise per Week: Patient declined    Minutes of Exercise per Session: Not on file  Stress: No Stress Concern Present (08/21/2024)   [employer] of Occupational Health - Occupational Stress Questionnaire    Feeling of Stress: Not at all  Social Connections: Socially Integrated (08/21/2024)   Social Connection and Isolation Panel    Frequency of Communication with Friends and Family: More than three times a week    Frequency of Social Gatherings with Friends and Family: Three times a week    Attends Religious Services: More than 4 times per year    Active Member of Clubs or Organizations: Yes  Attends Banker Meetings: More than 4 times per year    Marital Status: Married    Review of Systems Per HPI  Objective:  BP 113/75   Pulse 72   Ht 6' 1 (1.854 m)   Wt 183 lb (83 kg)   SpO2 98%   BMI 24.14 kg/m      08/22/2024    8:36 AM 08/08/2024    8:43 AM 08/02/2024    2:59 PM  BP/Weight  Systolic BP 113 113 109  Diastolic BP 75 81 83  Wt. (Lbs) 183 174   BMI 24.14 kg/m2 22.96 kg/m2     Physical Exam Vitals and nursing note reviewed.  Constitutional:      General: He is not in acute distress.    Appearance: Normal appearance.  HENT:     Head: Normocephalic and atraumatic.  Eyes:     General:        Right eye: No discharge.         Left eye: No discharge.     Conjunctiva/sclera: Conjunctivae normal.  Cardiovascular:     Rate and Rhythm: Normal rate and regular rhythm.  Pulmonary:     Effort: Pulmonary effort is normal.     Breath sounds: Normal breath sounds. No wheezing, rhonchi or rales.  Neurological:     Mental Status: He is alert.  Psychiatric:        Mood and Affect: Mood normal.        Behavior: Behavior normal.     Lab Results  Component Value Date   WBC 7.0 07/31/2024   HGB 13.4 07/31/2024   HCT 40.8 07/31/2024   PLT 160 07/31/2024   GLUCOSE 209 (H) 07/31/2024   CHOL 105 01/14/2024   TRIG 87 01/14/2024   HDL 39 (L) 01/14/2024   LDLCALC 49 01/14/2024   ALT 22 01/14/2024   AST 19 01/14/2024   NA 141 07/31/2024   K 4.3 07/31/2024   CL 105 07/31/2024   CREATININE 0.80 07/31/2024   BUN 13 07/31/2024   CO2 25 07/31/2024   TSH 3.170 01/14/2024   PSA 0.79 04/18/2014   INR 1.2 12/04/2019   HGBA1C 8.7 (H) 07/31/2024   MICROALBUR 10 07/28/2023     Assessment & Plan:  Type 2 diabetes mellitus with hyperglycemia, without long-term current use of insulin  (HCC) Assessment & Plan: Uncontrolled.  Follows with endocrinology.  Urine microalbumin today.  Orders: -     Microalbumin / creatinine urine ratio  Encounter for immunization  Encounter for immunization -     Flu vaccine HIGH DOSE PF(Fluzone Trivalent) -     Pneumococcal conjugate vaccine 20-valent  Essential hypertension Assessment & Plan: Well-controlled.  Continue losartan .   Hyperlipidemia, unspecified hyperlipidemia type Assessment & Plan: At goal on Lipitor.  Continue.  Orders: -     Atorvastatin  Calcium ; Take 1 tablet (20 mg total) by mouth at bedtime.  Dispense: 90 tablet; Refill: 3    Follow-up: 6 months  Sunjai Levandoski Bluford DO [facility]

## 2024-08-22 NOTE — Assessment & Plan Note (Addendum)
At goal on Lipitor. Continue. 

## 2024-08-22 NOTE — Assessment & Plan Note (Signed)
 Well controlled. Continue losartan.

## 2024-08-22 NOTE — Patient Instructions (Signed)
 Checking urine today.  Continue your medications.  Follow up in 6 months.

## 2024-08-22 NOTE — Assessment & Plan Note (Signed)
 Uncontrolled.  Follows with endocrinology.  Urine microalbumin today.

## 2024-08-24 LAB — MICROALBUMIN / CREATININE URINE RATIO
Creatinine, Urine: 152.9 mg/dL
Microalb/Creat Ratio: 9 mg/g{creat} (ref 0–29)
Microalbumin, Urine: 14.3 ug/mL

## 2024-08-27 ENCOUNTER — Ambulatory Visit: Payer: Self-pay | Admitting: Family Medicine

## 2024-08-29 ENCOUNTER — Encounter: Payer: Self-pay | Admitting: General Surgery

## 2024-08-29 ENCOUNTER — Ambulatory Visit (INDEPENDENT_AMBULATORY_CARE_PROVIDER_SITE_OTHER): Admitting: General Surgery

## 2024-08-29 ENCOUNTER — Ambulatory Visit: Admitting: Nurse Practitioner

## 2024-08-29 VITALS — BP 110/79 | HR 84 | Temp 97.6°F | Resp 16 | Ht 73.0 in | Wt 184.0 lb

## 2024-08-29 DIAGNOSIS — Z09 Encounter for follow-up examination after completed treatment for conditions other than malignant neoplasm: Secondary | ICD-10-CM

## 2024-08-29 NOTE — Progress Notes (Signed)
 Subjective:     [name] [name]  Patient here for postoperative visit, status post right inguinal herniorrhaphy with mesh.  He states he is doing well.  He does have a little numbness around his incision.  No swelling is noted.  He currently has no pain. Objective:    BP 110/79   Pulse 84   Temp 97.6 F (36.4 C) (Oral)   Resp 16   Ht 6' 1 (1.854 m)   Wt 184 lb (83.5 kg)   SpO2 98%   BMI 24.28 kg/m   General:  alert, cooperative, and no distress  Abdomen is soft, right groin incision healing well.  No hematoma or seroma present.  No recurrent hernia present.     Assessment:    Doing well postoperatively.    Plan:   May increase activity as able.  Follow-up here as needed.

## 2024-08-30 ENCOUNTER — Encounter: Payer: Self-pay | Admitting: Nurse Practitioner

## 2024-08-30 ENCOUNTER — Ambulatory Visit: Admitting: Nurse Practitioner

## 2024-08-30 VITALS — BP 108/72 | HR 68 | Ht 73.0 in | Wt 183.2 lb

## 2024-08-30 DIAGNOSIS — I10 Essential (primary) hypertension: Secondary | ICD-10-CM

## 2024-08-30 DIAGNOSIS — E782 Mixed hyperlipidemia: Secondary | ICD-10-CM

## 2024-08-30 DIAGNOSIS — Z7984 Long term (current) use of oral hypoglycemic drugs: Secondary | ICD-10-CM | POA: Diagnosis not present

## 2024-08-30 DIAGNOSIS — E119 Type 2 diabetes mellitus without complications: Secondary | ICD-10-CM

## 2024-08-30 MED ORDER — LANCETS MISC: 3 refills | Status: AC

## 2024-08-30 MED ORDER — CONTOUR NEXT TEST VI STRP: ORAL_STRIP | 3 refills | Status: AC

## 2024-08-30 NOTE — Progress Notes (Signed)
 Endocrinology Follow Up Note       08/30/2024, 9:10 AM   Subjective:    [name], male    DOB: [birth year].  [name] [name] is being seen in follow up after being seen in consultation for management of currently uncontrolled symptomatic diabetes requested by  Cook, Jayce G, DO.   Past Medical History:  Diagnosis Date   Anemia    Anxiety    Arthritis    Diabetes mellitus without complication (HCC)    Diabetes mellitus, type II (HCC)    Diverticulosis    pt unaware   ED (erectile dysfunction)    Fatty liver    GERD (gastroesophageal reflux disease)    History of COVID-19 07/2019   History of left inguinal hernia    History of retinal detachment    Right   Hyperlipidemia    Hypertension    Insomnia    Low ferritin level    Numbness and tingling of both feet    Shortness of breath dyspnea    since having COVID 07/2019 with exertions    Past Surgical History:  Procedure Laterality Date   BIOPSY  04/21/2022   Procedure: BIOPSY;  Surgeon: Cindie Carlin POUR, DO;  Location: [facility];  Service: Endoscopy;;   CHOLECYSTECTOMY     COLONOSCOPY N/A 11/30/2014   Procedure: COLONOSCOPY;  Surgeon: Lamar CHRISTELLA Hollingshead, MD;  Location: [facility];  Service: Endoscopy;  Laterality: N/A;  7:30 Am   COLONOSCOPY WITH PROPOFOL  N/A 04/21/2022   Procedure: COLONOSCOPY WITH PROPOFOL ;  Surgeon: Cindie Carlin POUR, DO;  Location: [facility];  Service: Endoscopy;  Laterality: N/A;  9:00am   ESOPHAGEAL DILATION     HERNIA REPAIR Left    1981   INGUINAL HERNIA REPAIR Right 08/02/2024   Procedure: REPAIR, HERNIA, INGUINAL, ADULT;  Surgeon: Mavis Anes, MD;  Location: [facility];  Service: General;  Laterality: Right;   POLYPECTOMY  04/21/2022   Procedure: POLYPECTOMY;  Surgeon: Cindie Carlin POUR, DO;  Location: [facility];  Service: Endoscopy;;   RETINAL DETACHMENT SURGERY Right    TOTAL KNEE ARTHROPLASTY  Left 12/11/2019   Procedure: TOTAL KNEE ARTHROPLASTY;  Surgeon: Melodi Lerner, MD;  Location: [facility];  Service: Orthopedics;  Laterality: Left;    XI ROBOTIC ASSISTED INGUINAL HERNIA REPAIR WITH MESH Right 05/10/2024   Procedure: REPAIR, HERNIA, INGUINAL, ROBOT-ASSISTED, LAPAROSCOPIC, USING MESH;  Surgeon: Mavis Anes, MD;  Location: [facility];  Service: General;  Laterality: Right;    Social History   Socioeconomic History   Marital status: Married    Spouse name: Not on file   Number of children: Not on file   Years of education: Not on file   Highest education level: 12th grade  Occupational History   Not on file  Tobacco Use   Smoking status: Never    Passive exposure: Never   Smokeless tobacco: Never  Vaping Use   Vaping status: Never Used  Substance and Sexual Activity   Alcohol use: No   Drug use: No   Sexual activity: Never  Other Topics Concern   Not on file  Social History Narrative   Not on file  Social Drivers of Corporate Investment Banker Strain: Low Risk  (08/21/2024)   Overall Financial Resource Strain (CARDIA)    Difficulty of Paying Living Expenses: Not hard at all  Food Insecurity: No Food Insecurity (08/21/2024)   Hunger Vital Sign    Worried About Running Out of Food in the Last Year: Never true    Ran Out of Food in the Last Year: Never true  Transportation Needs: No Transportation Needs (08/21/2024)   PRAPARE - Administrator, Civil Service (Medical): No    Lack of Transportation (Non-Medical): No  Physical Activity: Unknown (08/21/2024)   Exercise Vital Sign    Days of Exercise per Week: Patient declined    Minutes of Exercise per Session: Not on file  Stress: No Stress Concern Present (08/21/2024)   [employer] of Occupational Health - Occupational Stress Questionnaire    Feeling of Stress: Not at all  Social Connections: Socially Integrated (08/21/2024)   Social Connection and Isolation Panel    Frequency of Communication  with Friends and Family: More than three times a week    Frequency of Social Gatherings with Friends and Family: Three times a week    Attends Religious Services: More than 4 times per year    Active Member of Clubs or Organizations: Yes    Attends Banker Meetings: More than 4 times per year    Marital Status: Married    Family History  Problem Relation Age of Onset   Diabetes Mother    Neuropathy Mother     Outpatient Encounter Medications as of 08/30/2024  Medication Sig   acetaminophen  (TYLENOL ) 650 MG CR tablet Take 1,300 mg by mouth in the morning.   atorvastatin  (LIPITOR) 20 MG tablet Take 1 tablet (20 mg total) by mouth at bedtime.   Cyanocobalamin  (VITAMIN B-12 PO) Take 1 each by mouth in the morning. Gummies   dutasteride  (AVODART ) 0.5 MG capsule Take 1 capsule (0.5 mg total) by mouth at bedtime.   ferrous sulfate  325 (65 FE) MG tablet take 1 TABLET by mouth every other day.   fluticasone  (FLONASE ) 50 MCG/ACT nasal spray use 2 sprays in each nostril every day, as needed. (Patient taking differently: Place 2 sprays into both nostrils daily as needed (allergies.).)   glipiZIDE  (GLUCOTROL  XL) 5 MG 24 hr tablet Take 1 tablet (5 mg total) by mouth daily with breakfast.   Lancets MISC Dispense based on patient and insurance preference. Use up to 2 times daily as directed. (FOR ICD-10 E10.9, E11.9).   LORazepam  (ATIVAN ) 1 MG tablet Take 1 tablet (1 mg total) by mouth at bedtime as needed for anxiety or sleep. (Patient taking differently: Take 0.5-1 mg by mouth at bedtime as needed for anxiety or sleep.)   losartan  (COZAAR ) 50 MG tablet take 1 tablet (50 MILLIGRAM total) by mouth daily.   metFORMIN  (GLUCOPHAGE ) 500 MG tablet Take 2 tablets (1,000 mg total) by mouth 2 (two) times daily with a meal.   omeprazole  (PRILOSEC) 20 MG capsule Take 1 capsule (20 mg total) by mouth 2 (two) times daily before a meal. (Patient taking differently: Take 20 mg by mouth See admin  instructions. Take 1 capsule (20 mg) by mouth scheduled every morning & may take an additional dose (20 mg) if needed for acid reflux/indigestion.)   tamsulosin  (FLOMAX ) 0.4 MG CAPS capsule Take 2 capsules (0.8 mg total) by mouth at bedtime.   traMADol  (ULTRAM ) 50 MG tablet Take 1 tablet (50 mg total)  by mouth every 12 (twelve) hours as needed for severe pain (pain score 7-10).   [DISCONTINUED] CONTOUR NEXT TEST test strip USE TO TEST BLOOD SUGAR ONCE DAILY.   [DISCONTINUED] ONETOUCH DELICA LANCETS 33G MISC    glucose blood (CONTOUR NEXT TEST) test strip Use as instructed to monitor glucose twice daily   No facility-administered encounter medications on file as of 08/30/2024.    ALLERGIES: Allergies  Allergen Reactions   Dilaudid  [Hydromorphone  Hcl] Other (See Comments)    Pt stated he passed out when he took it   Ozempic  (0.25 Or 0.5 Mg-Dose) [Semaglutide (0.25 Or 0.5mg -Dos)] Diarrhea and Other (See Comments)    Abdominal pain    VACCINATION STATUS: Immunization History  Administered Date(s) Administered   INFLUENZA, HIGH DOSE SEASONAL PF 08/22/2024   Influenza, Seasonal, Injecte, Preservative Fre 06/19/2016, 08/23/2023   Influenza,inj,Quad PF,6+ Mos 06/17/2017, 06/20/2018, 07/12/2019, 07/19/2020, 08/19/2021, 08/20/2022   PNEUMOCOCCAL CONJUGATE-20 08/22/2024    Diabetes He presents for his follow-up diabetic visit. He has type 2 diabetes mellitus. Onset time: Diagnosed at approx age of 60. His disease course has been improving. There are no hypoglycemic associated symptoms. Associated symptoms include blurred vision and visual change. Pertinent negatives for diabetes include no fatigue, no foot paresthesias and no polydipsia. There are no hypoglycemic complications. Symptoms are stable. Diabetic complications include impotence, nephropathy and peripheral neuropathy. Risk factors for coronary artery disease include diabetes mellitus, dyslipidemia, family history, male sex and  hypertension. Current diabetic treatment includes oral agent (dual therapy). He is compliant with treatment most of the time. His weight is fluctuating minimally. He is following a generally healthy diet. When asked about meal planning, he reported none. He has not had a previous visit with a dietitian. He participates in exercise intermittently. His home blood glucose trend is fluctuating minimally. His breakfast blood glucose range is generally >200 mg/dl. His bedtime blood glucose range is generally >200 mg/dl. His overall blood glucose range is >200 mg/dl. (He presents today with his meter and logs showing above target glycemic profile overall.  His most recent A1c on 11/10 was 8.7%, improving from last visit of 13.4%.  He did have repeat hernia repair since last visit, has been on antibiotics, and notes he has not been able to exercise optimally due to the surgery.  He still drinks sun drop and sprite daily, eats a hamburger at lunch with fries most days. ) An ACE inhibitor/angiotensin II receptor blocker is being taken. He does not see a podiatrist.Eye exam is current.    Review of systems  Constitutional: +fluctuating body weight,  current Body mass index is 24.17 kg/m. , no fatigue, no subjective hyperthermia, no subjective hypothermia Eyes: no blurry vision, no xerophthalmia ENT: no sore throat, no nodules palpated in throat, no dysphagia/odynophagia, no hoarseness Cardiovascular: no chest pain, no shortness of breath, no palpitations, no leg swelling Respiratory: no cough, no shortness of breath Gastrointestinal: no nausea/vomiting/diarrhea Musculoskeletal: no muscle/joint aches Skin: no rashes, no hyperemia Neurological: no tremors, no numbness, no tingling, no dizziness Psychiatric: no depression, no anxiety   Objective:     BP 108/72 (BP Location: Left Arm, Patient Position: Sitting, Cuff Size: Large)   Pulse 68   Ht 6' 1 (1.854 m)   Wt 183 lb 3.2 oz (83.1 kg)   BMI 24.17 kg/m    Wt Readings from Last 3 Encounters:  08/30/24 183 lb 3.2 oz (83.1 kg)  08/29/24 184 lb (83.5 kg)  08/22/24 183 lb (83 kg)     BP  Readings from Last 3 Encounters:  08/30/24 108/72  08/29/24 110/79  08/22/24 113/75      Physical Exam- Limited  Constitutional:  Body mass index is 24.17 kg/m. , not in acute distress, normal state of mind Eyes:  EOMI, no exophthalmos Musculoskeletal: no gross deformities, strength intact in all four extremities, no gross restriction of joint movements Skin:  no rashes, no hyperemia Neurological: no tremor with outstretched hands   Diabetic Foot Exam - Simple   Simple Foot Form Diabetic Foot exam was performed with the following findings: Yes 08/30/2024  8:48 AM  Visual Inspection No deformities, no ulcerations, no other skin breakdown bilaterally: Yes Sensation Testing Intact to touch and monofilament testing bilaterally: Yes See comments: Yes Pulse Check Posterior Tibialis and Dorsalis pulse intact bilaterally: Yes Comments Diminished sensation to monofilament tool bilaterally    CMP ( most recent) CMP     Component Value Date/Time   NA 141 07/31/2024 1024   NA 138 01/14/2024 0852   K 4.3 07/31/2024 1024   CL 105 07/31/2024 1024   CO2 25 07/31/2024 1024   GLUCOSE 209 (H) 07/31/2024 1024   BUN 13 07/31/2024 1024   BUN 16 01/14/2024 0852   CREATININE 0.80 07/31/2024 1024   CREATININE 0.78 04/18/2014 0719   CALCIUM  9.8 07/31/2024 1024   PROT 6.3 01/14/2024 0852   ALBUMIN 3.7 (L) 01/14/2024 0852   ALBUMIN 30mg /L 06/18/2022 1112   AST 19 01/14/2024 0852   ALT 22 01/14/2024 0852   ALKPHOS 79 01/14/2024 0852   BILITOT 1.0 01/14/2024 0852   GFRNONAA >60 07/31/2024 1024   GFRAA 101 06/11/2020 1204     Diabetic Labs (most recent): Lab Results  Component Value Date   HGBA1C 8.7 (H) 07/31/2024   HGBA1C 13.4 (H) 05/05/2024   HGBA1C 7.3 (A) 01/25/2024   MICROALBUR 10 07/28/2023     Lipid Panel ( most recent) Lipid Panel      Component Value Date/Time   CHOL 105 01/14/2024 0852   TRIG 87 01/14/2024 0852   HDL 39 (L) 01/14/2024 0852   CHOLHDL 2.7 01/14/2024 0852   CHOLHDL 5.2 04/18/2014 0719   VLDL 41 (H) 04/18/2014 0719   LDLCALC 49 01/14/2024 0852   LABVLDL 17 01/14/2024 0852      Lab Results  Component Value Date   TSH 3.170 01/14/2024   TSH 3.390 01/19/2023   TSH 2.760 01/15/2022   TSH 3.940 12/23/2020   FREET4 1.18 01/14/2024   FREET4 1.33 01/19/2023   FREET4 1.20 01/15/2022           Assessment & Plan:   1) Type 2 diabetes mellitus without complication, without long-term current use of insulin  (HCC)  He presents today with his meter and logs showing above target glycemic profile overall.  His most recent A1c on 11/10 was 8.7%, improving from last visit of 13.4%.  He did have repeat hernia repair since last visit, has been on antibiotics, and notes he has not been able to exercise optimally due to the surgery.  He still drinks sun drop and sprite daily, eats a hamburger at lunch with fries most days.   - [name] [name] has currently uncontrolled symptomatic type 2 DM since 63 years of age.   -Recent labs reviewed.  - I had a long discussion with him about the progressive nature of diabetes and the pathology behind its complications. -his diabetes is complicated by neuropathy and mild CKD and he remains at a high risk for more acute and  chronic complications which include CAD, CVA, CKD, retinopathy, and neuropathy. These are all discussed in detail with him.  - Nutritional counseling repeated/built upon at each appointment.  - The patient admits there is a room for improvement in their diet and drink choices. -  Suggestion is made for the patient to avoid simple carbohydrates from their diet including Cakes, Sweet Desserts / Pastries, Ice Cream, Soda (diet and regular), Sweet Tea, Candies, Chips, Cookies, Sweet Pastries, Store Bought Juices, Alcohol in Excess of 1-2 drinks a day, Artificial  Sweeteners, Coffee Creamer, and Sugar-free Products. This will help patient to have stable blood glucose profile and potentially avoid unintended weight gain.   - I encouraged the patient to switch to unprocessed or minimally processed complex starch and increased protein intake (animal or plant source), fruits, and vegetables.   - Patient is advised to stick to a routine mealtimes to eat 3 meals a day and avoid unnecessary snacks (to snack only to correct hypoglycemia).  - he will be scheduled with Santana Duke, RDN, CDE for diabetes education.  - I have approached him with the following individualized plan to manage  his diabetes and patient agrees:   -He is advised to continue his Metformin  1000 mg po twice daily with meals and continue Glipizide  5 mg XL once daily at breakfast.   We did talk about possibly adding basal insulin  at next visit if A1c has not improved to near goal.  -he is advised to monitor glucose BID, before breakfast and before bed, and to call the clinic if he has readings less than 70 or above 300 for 3 tests in a row.  - Adjustment parameters are given to him for hypo and hyperglycemia in writing.  - Specific targets for  A1c;  LDL, HDL, and Triglycerides were discussed with the patient.  2) Blood Pressure /Hypertension:  his blood pressure is controlled to target.   he is advised to continue his current medications as prescribed by PCP.  3) Lipids/Hyperlipidemia:    Review of his recent lipid panel from 01/14/24 showed controlled LDL at 49.  he is advised to continue Lipitor 20 mg daily at bedtime.  Side effects and precautions discussed with him.  He is also encouraged to avoid fried foods and butter.    4)  Weight/Diet:  his Body mass index is 24.17 kg/m.  -   he is a candidate for some weight loss. I discussed with him the fact that loss of 5 - 10% of his  current body weight will have the most impact on his diabetes management.  Exercise, and detailed  carbohydrates information provided  -  detailed on discharge instructions.  5) Chronic Care/Health Maintenance: -he is on ACEI/ARB and Statin medications and is encouraged to initiate and continue to follow up with Ophthalmology, Dentist, Podiatrist at least yearly or according to recommendations, and advised to stay away from smoking. I have recommended yearly flu vaccine and pneumonia vaccine at least every 5 years; moderate intensity exercise for up to 150 minutes weekly; and sleep for at least 7 hours a day.    - he is advised to maintain close follow up with Cook, Jayce G, DO for primary care needs, as well as his other providers for optimal and coordinated care.    I spent  48  minutes in the care of the patient today including review of labs from CMP, Lipids, Thyroid  Function, Hematology (current and previous including abstractions from other facilities); face-to-face time discussing  his blood glucose readings/logs, discussing hypoglycemia and hyperglycemia episodes and symptoms, medications doses, his options of short and long term treatment based on the latest standards of care / guidelines;  discussion about incorporating lifestyle medicine;  and documenting the encounter. Risk reduction counseling performed per USPSTF guidelines to reduce obesity and cardiovascular risk factors.     Please refer to Patient Instructions for Blood Glucose Monitoring and Insulin /Medications Dosing Guide  in media tab for additional information. Please  also refer to  Patient Self Inventory in the Media  tab for reviewed elements of pertinent patient history.  [name] [name] participated in the discussions, expressed understanding, and voiced agreement with the above plans.  All questions were answered to his satisfaction. he is encouraged to contact clinic should he have any questions or concerns prior to his return visit.   Follow up plan: - Return in about 4 months (around 12/29/2024) for Diabetes F/U  with A1c in office, No previsit labs, Bring meter and logs.  Benton Rio, [facility] [address] [place], [place] [address] Phone: [phone] Fax: [phone]  08/30/2024, 9:10 AM

## 2024-09-08 ENCOUNTER — Other Ambulatory Visit: Payer: Self-pay | Admitting: Family Medicine

## 2024-09-08 DIAGNOSIS — G4700 Insomnia, unspecified: Secondary | ICD-10-CM

## 2024-12-29 ENCOUNTER — Ambulatory Visit: Admitting: Nurse Practitioner

## 2025-02-20 ENCOUNTER — Ambulatory Visit: Admitting: Family Medicine
